# Patient Record
Sex: Male | Born: 1940 | Race: White | Hispanic: No | Marital: Married | Smoking: Former smoker
Health system: Southern US, Community
[De-identification: ages and names within clinical notes are randomized; demographics above are authoritative.]

## PROBLEM LIST (undated history)

## (undated) DIAGNOSIS — I2510 Atherosclerotic heart disease of native coronary artery without angina pectoris: Secondary | ICD-10-CM

## (undated) DIAGNOSIS — I447 Left bundle-branch block, unspecified: Secondary | ICD-10-CM

## (undated) DIAGNOSIS — I429 Cardiomyopathy, unspecified: Secondary | ICD-10-CM

## (undated) DIAGNOSIS — I493 Ventricular premature depolarization: Secondary | ICD-10-CM

## (undated) DIAGNOSIS — K219 Gastro-esophageal reflux disease without esophagitis: Secondary | ICD-10-CM

## (undated) DIAGNOSIS — I5022 Chronic systolic (congestive) heart failure: Secondary | ICD-10-CM

## (undated) DIAGNOSIS — I480 Paroxysmal atrial fibrillation: Secondary | ICD-10-CM

## (undated) DIAGNOSIS — E785 Hyperlipidemia, unspecified: Secondary | ICD-10-CM

## (undated) DIAGNOSIS — I428 Other cardiomyopathies: Secondary | ICD-10-CM

## (undated) DIAGNOSIS — Z95810 Presence of automatic (implantable) cardiac defibrillator: Secondary | ICD-10-CM

## (undated) DIAGNOSIS — M5134 Other intervertebral disc degeneration, thoracic region: Secondary | ICD-10-CM

## (undated) DIAGNOSIS — I509 Heart failure, unspecified: Secondary | ICD-10-CM

## (undated) DIAGNOSIS — I77810 Thoracic aortic ectasia: Secondary | ICD-10-CM

## (undated) DIAGNOSIS — K635 Polyp of colon: Secondary | ICD-10-CM

## (undated) DIAGNOSIS — C4A9 Merkel cell carcinoma, unspecified: Secondary | ICD-10-CM

## (undated) DIAGNOSIS — E669 Obesity, unspecified: Secondary | ICD-10-CM

## (undated) DIAGNOSIS — I10 Essential (primary) hypertension: Secondary | ICD-10-CM

## (undated) DIAGNOSIS — I4901 Ventricular fibrillation: Secondary | ICD-10-CM

## (undated) HISTORY — PX: SKIN CANCER EXCISION: SHX779

## (undated) HISTORY — DX: Ventricular fibrillation: I49.01

## (undated) HISTORY — DX: Other cardiomyopathies: I42.8

## (undated) HISTORY — DX: Other intervertebral disc degeneration, thoracic region: M51.34

## (undated) HISTORY — DX: Presence of automatic (implantable) cardiac defibrillator: Z95.810

## (undated) HISTORY — DX: Paroxysmal atrial fibrillation: I48.0

## (undated) HISTORY — DX: Essential (primary) hypertension: I10

## (undated) HISTORY — DX: Gastro-esophageal reflux disease without esophagitis: K21.9

## (undated) HISTORY — DX: Atherosclerotic heart disease of native coronary artery without angina pectoris: I25.10

## (undated) HISTORY — DX: Left bundle-branch block, unspecified: I44.7

## (undated) HISTORY — DX: Chronic systolic (congestive) heart failure: I50.22

## (undated) HISTORY — DX: Polyp of colon: K63.5

## (undated) HISTORY — PX: TONSILLECTOMY: SUR1361

## (undated) HISTORY — DX: Thoracic aortic ectasia: I77.810

## (undated) HISTORY — DX: Cardiomyopathy, unspecified: I42.9

## (undated) HISTORY — DX: Merkel cell carcinoma, unspecified: C4A.9

## (undated) HISTORY — DX: Obesity, unspecified: E66.9

## (undated) HISTORY — DX: Hyperlipidemia, unspecified: E78.5

## (undated) HISTORY — DX: Ventricular premature depolarization: I49.3

---
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
## 2005-06-15 ENCOUNTER — Ambulatory Visit (HOSPITAL_COMMUNITY): Admission: RE | Admit: 2005-06-15 | Discharge: 2005-06-15 | Payer: Self-pay | Admitting: General Surgery

## 2005-06-28 ENCOUNTER — Ambulatory Visit: Payer: Self-pay | Admitting: Oncology

## 2005-06-28 ENCOUNTER — Ambulatory Visit (HOSPITAL_COMMUNITY): Admission: RE | Admit: 2005-06-28 | Discharge: 2005-06-28 | Payer: Self-pay | Admitting: General Surgery

## 2005-06-29 ENCOUNTER — Ambulatory Visit (HOSPITAL_COMMUNITY): Admission: RE | Admit: 2005-06-29 | Discharge: 2005-06-29 | Payer: Self-pay | Admitting: General Surgery

## 2005-07-05 ENCOUNTER — Ambulatory Visit (HOSPITAL_COMMUNITY): Admission: RE | Admit: 2005-07-05 | Discharge: 2005-07-05 | Payer: Self-pay | Admitting: Oncology

## 2005-07-11 ENCOUNTER — Ambulatory Visit (HOSPITAL_COMMUNITY): Admission: RE | Admit: 2005-07-11 | Discharge: 2005-07-11 | Payer: Self-pay | Admitting: General Surgery

## 2005-08-04 ENCOUNTER — Ambulatory Visit: Admission: RE | Admit: 2005-08-04 | Discharge: 2005-08-29 | Payer: Self-pay | Admitting: *Deleted

## 2007-10-25 HISTORY — PX: COLONOSCOPY: SHX174

## 2013-08-09 ENCOUNTER — Other Ambulatory Visit: Payer: Self-pay | Admitting: Gastroenterology

## 2014-02-04 ENCOUNTER — Other Ambulatory Visit (HOSPITAL_BASED_OUTPATIENT_CLINIC_OR_DEPARTMENT_OTHER): Payer: Self-pay | Admitting: Family Medicine

## 2014-02-04 DIAGNOSIS — J329 Chronic sinusitis, unspecified: Secondary | ICD-10-CM

## 2014-02-07 ENCOUNTER — Ambulatory Visit (HOSPITAL_BASED_OUTPATIENT_CLINIC_OR_DEPARTMENT_OTHER)
Admission: RE | Admit: 2014-02-07 | Discharge: 2014-02-07 | Disposition: A | Discharge status: Home / Self Care | Payer: Medicare Other | Source: Ambulatory Visit | Attending: Family Medicine | Admitting: Family Medicine

## 2014-02-07 DIAGNOSIS — J329 Chronic sinusitis, unspecified: Secondary | ICD-10-CM

## 2014-02-07 DIAGNOSIS — R51 Headache: Secondary | ICD-10-CM | POA: Insufficient documentation

## 2014-10-30 ENCOUNTER — Encounter: Payer: Self-pay | Admitting: Cardiology

## 2014-11-04 ENCOUNTER — Encounter: Payer: Self-pay | Admitting: *Deleted

## 2014-11-05 ENCOUNTER — Ambulatory Visit (INDEPENDENT_AMBULATORY_CARE_PROVIDER_SITE_OTHER): Payer: Medicare Other | Admitting: Cardiology

## 2014-11-05 ENCOUNTER — Encounter: Payer: Self-pay | Admitting: Cardiology

## 2014-11-05 VITALS — BP 118/74 | HR 79 | Ht 71.0 in | Wt 272.8 lb

## 2014-11-05 DIAGNOSIS — E785 Hyperlipidemia, unspecified: Secondary | ICD-10-CM

## 2014-11-05 DIAGNOSIS — R0600 Dyspnea, unspecified: Secondary | ICD-10-CM

## 2014-11-05 DIAGNOSIS — R0609 Other forms of dyspnea: Secondary | ICD-10-CM

## 2014-11-05 DIAGNOSIS — R0602 Shortness of breath: Secondary | ICD-10-CM

## 2014-11-05 DIAGNOSIS — I10 Essential (primary) hypertension: Secondary | ICD-10-CM

## 2014-11-05 NOTE — Progress Notes (Signed)
Patient ID: AIZIK REH, male   DOB: May 07, 1941, 74 y.o.   MRN: [id number]     Patient Name: [name] Date of Encounter: 11/05/2014  Primary Care Provider:  Orpah Melter, MD Primary Cardiologist:  Dorothy Spark   Problem List   Past Medical History  Diagnosis Date  . Obesity   . Hyperlipidemia   . LBBB (left bundle branch block)   . Prediabetes   . Hyperplastic colon polyp   . Degenerative disc disease, thoracic   . Acid reflux    Past Surgical History  Procedure Laterality Date  . Excision of merkel cell  2006  . Left knee surgery  1960  . Colonoscopy  2009  . Left and right cataract sugery      Allergies  Allergies  Allergen Reactions  . 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 The patient still works as an Chief Financial Officer and walks daily and has noticed in the last 2-3 months that is becoming more and more difficult. He gets short of breath with moderate exertion and was ignoring it for a while as he attributed to deconditioning and obesity. He denies any chest pain, he has just been prescribed low-dose Lasix for lower extremity edema predominantly in his right leg as his lymph nodes were removed during treatment of his cancer. He denies any palpitations or syncope. His father had myocardial infarction at age 73. His mom died of complications of lupus. Both of his kids that are a their late 50s have hypertension. His wife says that he snores and has sleep apnea but has never been evaluated for it.  Home Medications  Prior to Admission medications   Medication Sig Start Date End Date Taking? Authorizing Provider  aspirin 81 MG tablet Take 81 mg by mouth daily.   Yes Historical Provider, MD  fluorouracil (EFUDEX) 5 % cream Apply 5 application  topically as needed. Crusty patches on face 10/15/14  Yes Historical Provider, MD  furosemide (LASIX) 20 MG tablet Take 20 mg by mouth daily. 11/01/14  Yes Historical Provider, MD  ibuprofen (ADVIL,MOTRIN) 200 MG tablet Take 200 mg by mouth as needed for moderate pain.   Yes Historical Provider, MD  meloxicam (MOBIC) 15 MG tablet Take 15 mg by mouth as needed for pain. For pain   Yes Historical Provider, MD  metoprolol tartrate (LOPRESSOR) 25 MG tablet Take 25 mg by mouth 2 (two) times daily.   Yes Historical Provider, MD  omeprazole (PRILOSEC) 20 MG capsule Take 20 mg by mouth daily.   Yes Historical Provider, MD  simvastatin (ZOCOR) 20 MG tablet Take 20 mg by mouth daily.   Yes Historical Provider, MD    Family History  Family History  Problem Relation Age of Onset  . Cancer Father     prostate  . Cancer Mother     breast  . Lupus Mother   . Stroke Sister   . Thyroid disease Sister     Social History  History   Social History  . Marital Status: Married    Spouse Name: bridgette    Number of Children: 2  . Years of Education: college   Occupational History  . [employer]    Social History Main Topics  . Smoking status: Never Smoker   .  Smokeless tobacco: Not on file  . Alcohol Use: 0.0 oz/week    0 Not specified per week     Comment: wine occasionally  . Drug Use: No  . Sexual Activity: Not on file   Other Topics Concern  . Not on file   Social History Narrative     Review of Systems, as per HPI, otherwise negative General:  No chills, fever, night sweats or weight changes.  Cardiovascular:  No chest pain, dyspnea on exertion, edema, orthopnea, palpitations, paroxysmal nocturnal dyspnea. Dermatological: No rash, lesions/masses Respiratory: No cough, dyspnea Urologic: No hematuria, dysuria Abdominal:   No nausea, vomiting, diarrhea, bright red blood per rectum, melena, or hematemesis Neurologic:  No visual changes, wkns, changes in mental  status. All other systems reviewed and are otherwise negative except as noted above.  Physical Exam  Blood pressure 118/74, pulse 79, height 5\' 11"  (1.803 m), weight 272 lb 12.8 oz (123.741 kg).  General: Pleasant, NAD Psych: Normal affect. Neuro: Alert and oriented X 3. Moves all extremities spontaneously. HEENT: Normal  Neck: Supple without bruits or JVD. Lungs:  Resp regular and unlabored, CTA. Heart: RRR no s3, s4, or murmurs. Abdomen: Soft, non-tender, non-distended, BS + x 4.  Extremities: No clubbing, cyanosis or edema. DP/PT/Radials 2+ and equal bilaterally.  Labs:  No results for input(s): CKTOTAL, CKMB, TROPONINI in the last 72 hours. No results found for: WBC, HGB, HCT, MCV, PLT  No results found for: DDIMER Invalid input(s): POCBNP No results found for: NA, K, CL, CO2, GLUCOSE, BUN, CREATININE, CALCIUM, PROT, ALBUMIN, AST, ALT, ALKPHOS, BILITOT, GFRNONAA, GFRAA No results found for: CHOL, HDL, LDLCALC, TRIG  Accessory Clinical Findings  Echocardiogram - none  ECG -sinus rhythm, first-degree AV block, left bundle branch block and very frequent PVCs.    Assessment & Plan  74 year old gentleman  1. Dyspnea on exertion that is new and progressively worsening, also new left bundle branch block S3 don't have any EKG to compare. We will schedule patient for Lexi scan nuclear stress test to evaluate for ischemia. The other possibilities sleep apnea if all the workup negative we will refer for sleep apnea evaluation.  2. Shortness of breath and lower extremity edema - we will order an echocardiogram to evaluate for systolic and diastolic function.  3. Hypertension - controlled on current regimen  4. Hyperlipidemia unknown numbers, followed by primary care physician currently on simvastatin with no side effects.   Follow up in one month's.    Dorothy Spark, MD, [facility] 11/05/2014, 8:48 AM

## 2014-11-05 NOTE — Patient Instructions (Signed)
Your physician recommends that you continue on your current medications as directed. Please refer to the Current Medication list given to you today.    Your physician has requested that you have an echocardiogram. Echocardiography is a painless test that uses sound waves to create images of your heart. It provides your doctor with information about the size and shape of your heart and how well your heart's chambers and valves are working. This procedure takes approximately one hour. There are no restrictions for this procedure.    Your physician has requested that you have a lexiscan myoview. For further information please visit [email or web address]. Please follow instruction sheet, as given.    Your physician recommends that you schedule a follow-up appointment in: [place]

## 2014-11-11 ENCOUNTER — Telehealth: Payer: Self-pay | Admitting: *Deleted

## 2014-11-11 ENCOUNTER — Ambulatory Visit (HOSPITAL_BASED_OUTPATIENT_CLINIC_OR_DEPARTMENT_OTHER): Payer: Medicare Other

## 2014-11-11 ENCOUNTER — Ambulatory Visit (HOSPITAL_COMMUNITY): Payer: Medicare Other | Attending: Cardiology | Admitting: Radiology

## 2014-11-11 ENCOUNTER — Other Ambulatory Visit (HOSPITAL_COMMUNITY): Payer: Medicare Other | Admitting: *Deleted

## 2014-11-11 DIAGNOSIS — I447 Left bundle-branch block, unspecified: Secondary | ICD-10-CM | POA: Insufficient documentation

## 2014-11-11 DIAGNOSIS — R0602 Shortness of breath: Secondary | ICD-10-CM

## 2014-11-11 DIAGNOSIS — R931 Abnormal findings on diagnostic imaging of heart and coronary circulation: Secondary | ICD-10-CM

## 2014-11-11 DIAGNOSIS — R0600 Dyspnea, unspecified: Secondary | ICD-10-CM

## 2014-11-11 DIAGNOSIS — I10 Essential (primary) hypertension: Secondary | ICD-10-CM | POA: Insufficient documentation

## 2014-11-11 DIAGNOSIS — E785 Hyperlipidemia, unspecified: Secondary | ICD-10-CM | POA: Insufficient documentation

## 2014-11-11 MED ORDER — TECHNETIUM TC 99M SESTAMIBI GENERIC - CARDIOLITE
30.0000 | Freq: Once | INTRAVENOUS | Status: AC | PRN
  Administered 2014-11-11: 30 via INTRAVENOUS

## 2014-11-11 MED ORDER — ADENOSINE (DIAGNOSTIC) 3 MG/ML IV SOLN: 0.5600 mg/kg | Freq: Once | INTRAVENOUS | Status: AC

## 2014-11-11 MED ORDER — PERFLUTREN LIPID MICROSPHERE
1.0000 mL | INTRAVENOUS | Status: AC | PRN
  Administered 2014-11-11: 1 mL via INTRAVENOUS

## 2014-11-11 MED ORDER — TECHNETIUM TC 99M SESTAMIBI GENERIC - CARDIOLITE
10.0000 | Freq: Once | INTRAVENOUS | Status: AC | PRN
  Administered 2014-11-11: 10 via INTRAVENOUS

## 2014-11-11 NOTE — Progress Notes (Signed)
2D Echo with Definity completed. 11/11/2014

## 2014-11-11 NOTE — Telephone Encounter (Signed)
Reviewed the pts echo and nuclear stress test with the DOD Dr Mare Ferrari from today, due to primary Cardiologist Dr Meda Coffee being out of the office.  Noted the pts echo showed a EF of 25-30% and no prior echo to compare with.  Nuclear stress test revealed low risk study.  Per Dr Mare Ferrari the pt should have a BMET drawn tomorrow to assess kidney function to possibly start new medication Lisinopril 5 mg po daily, and follow-up in a week with Dr Meda Coffee or a PA. Made pt an appt for 11/12/14 at our office to have a bmet checked.  Pt aware that someone from scheduling will be contacting him to have a follow-up appt scheduled for later in the week, next week. Pt verbalized understanding and agrees with this plan.  Will routed this message to Dr Meda Coffee for further review.

## 2014-11-11 NOTE — Progress Notes (Signed)
  [facility] [address] [place], [place] [address] [phone]    Cardiology Nuclear Med Study  [name] is a 74 y.o. male     MRN : [id number]     DOB: [birth year]  Procedure Date: 11/11/2014  Nuclear Med Background Indication for Stress Test:  Evaluation for Ischemia History:  n/a Cardiac Risk Factors: Hypertension, LBBB and Lipids  Symptoms:  DOE and SOB   Nuclear Pre-Procedure Caffeine/Decaff Intake:  7:00pm NPO After: 6:30am   Lungs:  clear O2 Sat: 94% on room air. IV 0.9% NS with Angio Cath:  22g  IV Site: R Wrist  IV Started by:  Matilde Haymaker, RN  Chest Size (in):  50 Cup Size: n/a  Height: 5\' 11"  (1.803 m)  Weight:  269 lb (122.018 kg)  BMI:  Body mass index is 37.53 kg/(m^2). Tech Comments:  No Lopressor x 17 hrs    Nuclear Med Study 1 or 2 day study: 1 day  Stress Test Type:  Adenosine  Reading MD: n/a  Order Authorizing Provider:  Filiberto Pinks  Resting Radionuclide: Technetium 27m Sestamibi  Resting Radionuclide Dose: 11.0 mCi   Stress Radionuclide:  Technetium 35m Sestamibi  Stress Radionuclide Dose: 33.0 mCi           Stress Protocol Rest HR: 100 Stress HR: 104  Rest BP: 110/89 Stress BP: 134/84  Exercise Time (min): n/a METS: n/a   Predicted Max HR: 147 bpm % Max HR: 70.75 bpm Rate Pressure Product: 13936   Dose of Adenosine (mg):  20 Dose of Lexiscan: n/a mg  Dose of Atropine (mg): n/a Dose of Dobutamine: n/a mcg/kg/min (at max HR)  Stress Test Technologist: Perrin Maltese, EMT-P  Nuclear Technologist:  Earl Many, CNMT     Rest Procedure:  Myocardial perfusion imaging was performed at rest 45 minutes following the intravenous administration of Technetium 73m Sestamibi. Rest ECG: NSR-LBBB  Stress Procedure:  The patient received IV Lexiscan 0.4 mg over 15-seconds.  Technetium 43m Sestamibi injected at 30-seconds. This patient had a hot flash and felt warm with the Adenosine infusion.  Quantitative spect images were obtained after a 45 minute delay. Stress ECG: Uninteretable due to baseline LBBB  QPS Raw Data Images:  Acquisition technically good; severe LVE. Stress Images:  Normal homogeneous uptake in all areas of the myocardium. Rest Images:  Normal homogeneous uptake in all areas of the myocardium. Subtraction (SDS):  No evidence of ischemia. Transient Ischemic Dilatation (Normal <1.22):  1.04 Lung/Heart Ratio (Normal <0.45):  0.34  Quantitative Gated Spect Images QGS EDV:  NA ml QGS ESV:  NA ml  Impression Exercise Capacity:  Adenosine study with no exercise. BP Response:  Normal blood pressure response. Clinical Symptoms:  No chest pain or dyspnea. ECG Impression:  Baseline:  LBBB.  EKG uninterpretable due to LBBB at rest and stress. Frequent PVCs, couplets. Comparison with Prior Nuclear Study: No previous nuclear study performed  Overall Impression:  Low risk stress nuclear study with normal perfusion; significant LVE.  LV Ejection Fraction: Study not gated.  LV Wall Motion:  Study not gated; suggest echo to assess LV function.   Kirk Ruths

## 2014-11-12 ENCOUNTER — Other Ambulatory Visit (INDEPENDENT_AMBULATORY_CARE_PROVIDER_SITE_OTHER): Payer: Medicare Other | Admitting: *Deleted

## 2014-11-12 ENCOUNTER — Telehealth: Payer: Self-pay | Admitting: Cardiology

## 2014-11-12 DIAGNOSIS — R931 Abnormal findings on diagnostic imaging of heart and coronary circulation: Secondary | ICD-10-CM

## 2014-11-12 DIAGNOSIS — R0602 Shortness of breath: Secondary | ICD-10-CM

## 2014-11-12 LAB — BASIC METABOLIC PANEL
BUN: 18 mg/dL (ref 6–23)
CO2: 30 mEq/L (ref 19–32)
Calcium: 9.5 mg/dL (ref 8.4–10.5)
Chloride: 107 mEq/L (ref 96–112)
Creatinine, Ser: 1.09 mg/dL (ref 0.40–1.50)
GFR: 70.4 mL/min (ref >60.00)
Glucose, Bld: 117 mg/dL — ABNORMAL HIGH (ref 70–99)
Potassium: 4 mEq/L (ref 3.5–5.1)
Sodium: 141 mEq/L (ref 135–145)

## 2014-11-12 MED ORDER — ADENOSINE (DIAGNOSTIC) 3 MG/ML IV SOLN
0.5600 mg/kg | Freq: Once | INTRAVENOUS | Status: AC
  Administered 2014-11-11: 60 mg via INTRAVENOUS

## 2014-11-12 MED ORDER — LISINOPRIL 5 MG PO TABS: 5.0000 mg | ORAL_TABLET | Freq: Every day | ORAL | Status: DC

## 2014-11-12 NOTE — Telephone Encounter (Signed)
Pt contacted about phone conversation yesterday 1/19, in regards to abnormal echo and new order per DOD Dr Mare Ferrari for the pt to have a bmet on 1/20 to assess kidney function to start new med Lisinopril 5 mg po daily for an EF on his echo of 25%. Informed the pt that these orders and results were discussed and resulted by Dr Meda Coffee as well, and per Dr Meda Coffee she agreed with Dr Sherryl Barters decision.  Informed the pt that per Dr Meda Coffee he will need to be seen  in the clinic within the next 2 weeks, with her, and we will order additional labs- CMP, BNP, CBC, TSH, and lipids then as well. Informed the pt that his lab results from today have not resulted yet, but when they do I will call him with the results and further order to proceed with the Lisinopril if his kidney function is WNL.  Also informed the pt that I did receive a copy of recent labs and dictation note from his PCP Dr Doyle Askew, and had them scanned into our system.  Informed the pt that my scheduler will be calling him to set up a 2 week OV with Dr Meda Coffee, with same day labs.  Pt verbalized understanding and agrees with this plan.

## 2014-11-12 NOTE — Addendum Note (Signed)
Addended by: Gardiner Coins on: 11/12/2014 08:58 AM   Modules accepted: Orders

## 2014-11-12 NOTE — Telephone Encounter (Signed)
Follow up      Checking to see if you received the info given to [facility] (lab tech) yesterday

## 2014-11-12 NOTE — Telephone Encounter (Signed)
Contacted the pt to notify him that his BMET results came back and revealed normal kidney function.  Informed the pt that per Dr Mare Ferrari, if his kidney function is normal, he should start taking Lisinopril 5 mg po daily, and come to his scheduled appt with Dr Meda Coffee on 11/25/14 at 0830, with labs same day. Confirmed the pharmacy of choice with the pt.  Pt verbalized understanding and agree with this plan.  Will route this message to Dr Meda Coffee for further review.

## 2014-11-12 NOTE — Telephone Encounter (Signed)
New Message ° °Pt returned call//sr  °

## 2014-11-24 HISTORY — PX: CATARACT EXTRACTION W/ INTRAOCULAR LENS  IMPLANT, BILATERAL: SHX1307

## 2014-11-25 ENCOUNTER — Encounter: Payer: Self-pay | Admitting: Cardiology

## 2014-11-25 ENCOUNTER — Other Ambulatory Visit (INDEPENDENT_AMBULATORY_CARE_PROVIDER_SITE_OTHER): Payer: Medicare Other | Admitting: *Deleted

## 2014-11-25 ENCOUNTER — Ambulatory Visit (INDEPENDENT_AMBULATORY_CARE_PROVIDER_SITE_OTHER): Payer: Medicare Other | Admitting: Cardiology

## 2014-11-25 VITALS — BP 120/60 | HR 51 | Ht 71.0 in | Wt 269.0 lb

## 2014-11-25 DIAGNOSIS — R0602 Shortness of breath: Secondary | ICD-10-CM

## 2014-11-25 DIAGNOSIS — I428 Other cardiomyopathies: Secondary | ICD-10-CM

## 2014-11-25 DIAGNOSIS — R931 Abnormal findings on diagnostic imaging of heart and coronary circulation: Secondary | ICD-10-CM

## 2014-11-25 DIAGNOSIS — R0609 Other forms of dyspnea: Secondary | ICD-10-CM

## 2014-11-25 DIAGNOSIS — I10 Essential (primary) hypertension: Secondary | ICD-10-CM

## 2014-11-25 DIAGNOSIS — I5022 Chronic systolic (congestive) heart failure: Secondary | ICD-10-CM

## 2014-11-25 DIAGNOSIS — I429 Cardiomyopathy, unspecified: Secondary | ICD-10-CM

## 2014-11-25 DIAGNOSIS — I420 Dilated cardiomyopathy: Secondary | ICD-10-CM

## 2014-11-25 DIAGNOSIS — R0600 Dyspnea, unspecified: Secondary | ICD-10-CM

## 2014-11-25 LAB — CBC WITH DIFFERENTIAL/PLATELET
Basophils Absolute: 0 10*3/uL (ref 0.0–0.1)
Basophils Relative: 0.1 % (ref 0.0–3.0)
Eosinophils Absolute: 0.1 10*3/uL (ref 0.0–0.7)
Eosinophils Relative: 2.4 % (ref 0.0–5.0)
HCT: 39.8 % (ref 39.0–52.0)
Hemoglobin: 13.9 g/dL (ref 13.0–17.0)
Lymphocytes Relative: 23.1 % (ref 12.0–46.0)
Lymphs Abs: 0.9 10*3/uL (ref 0.7–4.0)
MCHC: 34.8 g/dL (ref 30.0–36.0)
MCV: 96.6 fl (ref 78.0–100.0)
Monocytes Absolute: 0.5 10*3/uL (ref 0.1–1.0)
Monocytes Relative: 11.3 % (ref 3.0–12.0)
Neutro Abs: 2.6 10*3/uL (ref 1.4–7.7)
Neutrophils Relative %: 63.1 % (ref 43.0–77.0)
Platelets: 136 10*3/uL — ABNORMAL LOW (ref 150.0–400.0)
RBC: 4.11 Mil/uL — ABNORMAL LOW (ref 4.22–5.81)
RDW: 13.3 % (ref 11.5–15.5)
WBC: 4.1 10*3/uL (ref 4.0–10.5)

## 2014-11-25 LAB — LIPID PANEL
Cholesterol: 114 mg/dL (ref 0–200)
HDL: 42.2 mg/dL (ref >39.00)
LDL Cholesterol: 59 mg/dL (ref 0–99)
NonHDL: 71.8
Total CHOL/HDL Ratio: 3
Triglycerides: 66 mg/dL (ref 0.0–149.0)
VLDL: 13.2 mg/dL (ref 0.0–40.0)

## 2014-11-25 LAB — COMPREHENSIVE METABOLIC PANEL
ALT: 22 U/L (ref 0–53)
AST: 17 U/L (ref 0–37)
Albumin: 4.2 g/dL (ref 3.5–5.2)
Alkaline Phosphatase: 50 U/L (ref 39–117)
BUN: 18 mg/dL (ref 6–23)
CO2: 29 mEq/L (ref 19–32)
Calcium: 9.3 mg/dL (ref 8.4–10.5)
Chloride: 106 mEq/L (ref 96–112)
Creatinine, Ser: 1.07 mg/dL (ref 0.40–1.50)
GFR: 71.91 mL/min (ref >60.00)
Glucose, Bld: 106 mg/dL — ABNORMAL HIGH (ref 70–99)
Potassium: 4 mEq/L (ref 3.5–5.1)
Sodium: 140 mEq/L (ref 135–145)
Total Bilirubin: 0.6 mg/dL (ref 0.2–1.2)
Total Protein: 6.6 g/dL (ref 6.0–8.3)

## 2014-11-25 LAB — TSH: TSH: 5.19 u[IU]/mL — ABNORMAL HIGH (ref 0.35–4.50)

## 2014-11-25 LAB — BRAIN NATRIURETIC PEPTIDE: Pro B Natriuretic peptide (BNP): 403 pg/mL — ABNORMAL HIGH (ref 0.0–100.0)

## 2014-11-25 NOTE — Addendum Note (Signed)
Addended by: Eulis Foster on: 11/25/2014 07:47 AM   Modules accepted: Orders

## 2014-11-25 NOTE — Patient Instructions (Signed)
Your physician recommends that you continue on your current medications as directed. Please refer to the Current Medication list given to you today.   Your physician has requested that you have an echocardiogram. Echocardiography is a painless test that uses sound waves to create images of your heart. It provides your doctor with information about the size and shape of your heart and how well your heart's chambers and valves are working. This procedure takes approximately one hour. There are no restrictions for this procedure. DR Meda Coffee WOULD LIKE FOR YOU TO HAVE THIS DONE PRIOR TO YOUR 3 MONTH FOLLOW-UP APPOINTMENT WITH HER.   You have been referred to [facility]   Your physician recommends that you schedule a follow-up appointment in: [place]

## 2014-11-25 NOTE — Progress Notes (Signed)
Patient ID: [name], male   DOB: 10/14/1941, 74 y.o.   MRN: [id number]     Patient Name: [name] Date of Encounter: 11/25/2014  Primary Care Provider:  Orpah Melter, MD Primary Cardiologist:  Dorothy Spark   Problem List   Past Medical History  Diagnosis Date  . Obesity   . Hyperlipidemia   . LBBB (left bundle branch block)   . Prediabetes   . Hyperplastic colon polyp   . Degenerative disc disease, thoracic   . Acid reflux    Past Surgical History  Procedure Laterality Date  . Excision of merkel cell  2006  . Left knee surgery  1960  . Colonoscopy  2009  . Left and right cataract sugery      Allergies  Allergies  Allergen Reactions  . 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 The patient still works as an Chief Financial Officer and walks daily and has noticed in the last 2-3 months that is becoming more and more difficult. He gets short of breath with moderate exertion and was ignoring it for a while as he attributed to deconditioning and obesity. He denies any chest pain, he has just been prescribed low-dose Lasix for lower extremity edema predominantly in his right leg as his lymph nodes were removed during treatment of his cancer. He denies any palpitations or syncope. His father had myocardial infarction at age 79. His mom died of complications of lupus. Both of his kids that are a their late 75s have hypertension. His wife says that he snores and has sleep apnea but has never been evaluated for it.  11/25/2014 - the patient is coming after 3 weeks, he underwentLexiscan nuclear stress test that showed no prior scar and no ischemia but dilated left ventricle with moderately to severely impaired systolic function. Patient was started on lisinopril 5  mg daily that doesn't give him any side effects but doesn't seem any on improvement yet. He has stable right lower extremity lymphedema his left lower extremity doesn't have any edema.no orthopnea or paroxysmal nocturnal dyspnea or dyspnea on exertion for example 2 flight of stairs will make him short of breath. No chest pain. No palpitations.  Home Medications  Prior to Admission medications   Medication Sig Start Date End Date Taking? Authorizing Provider  aspirin 81 MG tablet Take 81 mg by mouth daily.   Yes Historical Provider, MD  fluorouracil (EFUDEX) 5 % cream Apply 5 application topically as needed. Crusty patches on face 10/15/14  Yes Historical Provider, MD  furosemide (LASIX) 20 MG tablet Take 20 mg by mouth daily. 11/01/14  Yes Historical Provider, MD  ibuprofen (ADVIL,MOTRIN) 200 MG tablet Take 200 mg by mouth as needed for moderate pain.   Yes Historical Provider, MD  meloxicam (MOBIC) 15 MG tablet Take 15 mg by mouth as needed for pain. For pain   Yes Historical Provider, MD  metoprolol tartrate (LOPRESSOR) 25 MG tablet Take 25 mg by mouth 2 (two) times daily.   Yes Historical Provider, MD  omeprazole (PRILOSEC) 20 MG capsule Take 20 mg by mouth daily.   Yes Historical Provider, MD  simvastatin (ZOCOR) 20 MG tablet Take 20 mg by mouth daily.   Yes Historical Provider, MD    Family History  Family History  Problem  Relation Age of Onset  . Cancer Father     prostate  . Cancer Mother     breast  . Lupus Mother   . Stroke Sister   . Thyroid disease Sister     Social History  History   Social History  . Marital Status: Married    Spouse Name: bridgette    Number of Children: 2  . Years of Education: college   Occupational History  . [employer]    Social History Main Topics  . Smoking status: Never Smoker   . Smokeless tobacco: Not on file  . Alcohol Use: 0.0 oz/week    0 Not specified per week     Comment: wine occasionally  . Drug Use: No  .  Sexual Activity: Not on file   Other Topics Concern  . Not on file   Social History Narrative     Review of Systems, as per HPI, otherwise negative General:  No chills, fever, night sweats or weight changes.  Cardiovascular:  No chest pain, dyspnea on exertion, edema, orthopnea, palpitations, paroxysmal nocturnal dyspnea. Dermatological: No rash, lesions/masses Respiratory: No cough, dyspnea Urologic: No hematuria, dysuria Abdominal:   No nausea, vomiting, diarrhea, bright red blood per rectum, melena, or hematemesis Neurologic:  No visual changes, wkns, changes in mental status. All other systems reviewed and are otherwise negative except as noted above.  Physical Exam  Blood pressure 120/60, pulse 51, height 5\' 11"  (1.803 m), weight 269 lb (122.018 kg).  General: Pleasant, NAD Psych: Normal affect. Neuro: Alert and oriented X 3. Moves all extremities spontaneously. HEENT: Normal  Neck: Supple without bruits or JVD. Lungs:  Resp regular and unlabored, CTA. Heart: RRR no s3, s4, or murmurs. Abdomen: Soft, non-tender, non-distended, BS + x 4.  Extremities: No clubbing, cyanosis, RLE lymphedema. DP/PT/Radials 2+ and equal bilaterally.  Labs:  No results for input(s): CKTOTAL, CKMB, TROPONINI in the last 72 hours. No results found for: WBC, HGB, HCT, MCV, PLT  No results found for: DDIMER Invalid input(s): POCBNP    Component Value Date/Time   NA 141 11/12/2014 0742   K 4.0 11/12/2014 0742   CL 107 11/12/2014 0742   CO2 30 11/12/2014 0742   GLUCOSE 117* 11/12/2014 0742   BUN 18 11/12/2014 0742   CREATININE 1.09 11/12/2014 0742   CALCIUM 9.5 11/12/2014 0742   No results found for: CHOL, HDL, LDLCALC, TRIG  Accessory Clinical Findings  Echocardiogram - 11/11/2014 Left ventricle: The cavity size was normal. There was mild focal basal hypertrophy of the septum. Systolic function was severely reduced. The estimated ejection fraction was in the range of 25% to 30%.  Diffuse hypokinesis. There is akinesis of the inferior myocardium. Acoustic contrast opacification revealed no evidence ofthrombus. - Mitral valve: There was moderate regurgitation directed posteriorly. - Left atrium: The atrium was moderately dilated. ECG -sinus rhythm, first-degree AV block, left bundle branch block and very frequent PVCs.  Lexiscan 11/11/2014 Impression Exercise Capacity: Adenosine study with no exercise. BP Response: Normal blood pressure response. Clinical Symptoms: No chest pain or dyspnea. ECG Impression: Baseline: LBBB. EKG uninterpretable due to LBBB at rest and stress. Frequent PVCs, couplets. Comparison with Prior Nuclear Study: No previous nuclear study performed  Overall Impression: Low risk stress nuclear study with normal perfusion; significant LVE.  LV Ejection Fraction: Study not gated. LV Wall Motion: Study not gated; suggest echo to assess LV function.  Kirk Ruths     Assessment & Plan  74 year old gentleman  1.  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  The patient was started on lisinopril 5 mg daily if his creatinine is normal we will increase to 10 mg daily. We cannot start him on beta blocker as his baseline heart rate is 51. We will add spironolactone if K ok.  We will refer him to cardiac rehabilitation. We will follow him in 3 months with echocardiogram if his left ventricle ejection fraction remains unchanged will refer him for ICD implantation.  2. Hypertension - controlled on current regimen  3. Hyperlipidemia unknown numbers, followed by primary care physician currently on simvastatin with no side effects.  Follow up in 3 months.   Dorothy Spark, MD, [facility] 11/25/2014, 8:50 AM

## 2014-11-27 ENCOUNTER — Telehealth: Payer: Self-pay | Admitting: Cardiology

## 2014-11-27 NOTE — Telephone Encounter (Signed)
New Msg       Ida from [facility] calling, states lab results that were sent are still unable to be read.   Can they be mailed? Or are there any other suggesstions?  Please return call and advise [phone].

## 2014-11-27 NOTE — Telephone Encounter (Signed)
Informed Ida at [facility],  that I routed the pts lab results to Dr Doyle Askew through epic.  Informed her that all he would then need to do is check his in-basket for the lab results.  Ida verbalized understanding and gracious for all the assistance provided.

## 2014-12-10 ENCOUNTER — Telehealth: Payer: Self-pay | Admitting: Cardiology

## 2014-12-10 NOTE — Telephone Encounter (Signed)
Informed the pt and wife that I spoke with Cardiac Rehab today and made them aware that the referral for this will be refaxed today.  Informed both parties that a confirmation of fax was sent through stating that referral was sent and received by cardiac rehab.  Informed both parties that once the referral goes through, it does have to get approved through the pts insurance and then he will be notified for new pt orientation for cardiac rehab.  Informed both parties that everything is in process and being worked on.  Both verbalized understanding and agree with this plan.

## 2014-12-10 NOTE — Telephone Encounter (Signed)
New Message         Pt's wife calling stating that they have been trying to get pt scheduled for an appt w/ cardiac rehab and are getting the run around. Wanting to know if everything has been sent/done on Dr. Francesca Oman part in order for pt to have cardiac rehab. Please call back and advise.

## 2014-12-16 ENCOUNTER — Encounter (HOSPITAL_COMMUNITY): Payer: Self-pay

## 2014-12-16 ENCOUNTER — Ambulatory Visit: Payer: Medicare Other | Admitting: Cardiology

## 2014-12-16 ENCOUNTER — Encounter (HOSPITAL_COMMUNITY)
Admission: RE | Admit: 2014-12-16 | Discharge: 2014-12-16 | Disposition: A | Discharge status: Home / Self Care | Payer: Medicare Other | Source: Ambulatory Visit | Attending: Cardiology | Admitting: Cardiology

## 2014-12-16 ENCOUNTER — Telehealth: Payer: Self-pay | Admitting: Cardiology

## 2014-12-16 DIAGNOSIS — I509 Heart failure, unspecified: Secondary | ICD-10-CM | POA: Insufficient documentation

## 2014-12-16 MED ORDER — LISINOPRIL 2.5 MG PO TABS: 2.5000 mg | ORAL_TABLET | Freq: Every day | ORAL | Status: DC

## 2014-12-16 NOTE — Progress Notes (Addendum)
Medications reviewed with patient and his wife as part of cardiac rehab orientation.  Pt recently started on levothyroxine however unsure of dosage.  Pt instructed to bring medication bottle or dosing information with him to next scheduled appointment. Pt BP 94/60 sitting, 94/60 standing.  HR-80.  Pt c/o fatigue, otherwise asymptomatic.  Pt has had light PO food or fluid intake today.  Pt given gatorade.  Recheck 98/60 sitting, 104/60 standing.  PC to Dr. Meda Coffee, Lm for Karlene Einstein , Dr Meda Coffee nurse to return call.

## 2014-12-16 NOTE — Telephone Encounter (Signed)
New message     Pt c/o BP issue: STAT if pt c/o blurred vision, one-sided weakness or slurred speech  1. What are your last 5 BP readings? 94/60 2. Are you having any other symptoms (ex. Dizziness, headache, blurred vision, passed out)? no 3. What is your BP issue? Pt is at cardiac rehab orientation.  He bp is low..  Will Dr Meda Coffee want to adjust his bp medication

## 2014-12-16 NOTE — Telephone Encounter (Signed)
He should decrease lisinopril to 2.5 mg po daily.

## 2014-12-16 NOTE — Telephone Encounter (Signed)
**Note De-Identified [name]** [name] at cardiac rehab is advised and she verbalized understanding. Lisinopril 2.5 mg daily updated in the pts chart.

## 2014-12-17 ENCOUNTER — Telehealth (HOSPITAL_COMMUNITY): Payer: Self-pay | Admitting: Cardiac Rehabilitation

## 2014-12-17 NOTE — Telephone Encounter (Signed)
(  late entry for 12/16/2014 5:20pm)  pc received from [place], Dr. Francesca Oman office with instruction for pt to decrease lisinopril to 2.5mg  daily.  Pc to pt to advise of this new order.  Pt and pt wife verbalized understanding.

## 2014-12-22 ENCOUNTER — Encounter (HOSPITAL_COMMUNITY)
Admission: RE | Admit: 2014-12-22 | Discharge: 2014-12-22 | Disposition: A | Discharge status: Home / Self Care | Payer: Medicare Other | Source: Ambulatory Visit | Attending: Cardiology | Admitting: Cardiology

## 2014-12-22 DIAGNOSIS — I509 Heart failure, unspecified: Secondary | ICD-10-CM | POA: Diagnosis not present

## 2014-12-22 NOTE — Progress Notes (Addendum)
Pt started cardiac rehab today.  Pt tolerated light exercise without difficulty. Telemetry rhythm Sinus with a first degree heart block, bundle branch block and occasional PVC's this has been previously documented.  Vital signs stable.[name]'s short and long term goals are to improve his fatigue level be able to exercise and to loose weight. Will continue to monitor the patient throughout  the program. PHQ=0.

## 2014-12-26 ENCOUNTER — Encounter (HOSPITAL_COMMUNITY)
Admission: RE | Admit: 2014-12-26 | Discharge: 2014-12-26 | Disposition: A | Discharge status: Home / Self Care | Payer: Medicare Other | Source: Ambulatory Visit | Attending: Cardiology | Admitting: Cardiology

## 2014-12-26 DIAGNOSIS — I509 Heart failure, unspecified: Secondary | ICD-10-CM | POA: Insufficient documentation

## 2014-12-29 ENCOUNTER — Encounter (HOSPITAL_COMMUNITY)
Admission: RE | Admit: 2014-12-29 | Discharge: 2014-12-29 | Disposition: A | Discharge status: Home / Self Care | Payer: Medicare Other | Source: Ambulatory Visit | Attending: Cardiology | Admitting: Cardiology

## 2014-12-29 DIAGNOSIS — I509 Heart failure, unspecified: Secondary | ICD-10-CM | POA: Diagnosis not present

## 2014-12-31 ENCOUNTER — Encounter (HOSPITAL_COMMUNITY)
Admission: RE | Admit: 2014-12-31 | Discharge: 2014-12-31 | Disposition: A | Discharge status: Home / Self Care | Payer: Medicare Other | Source: Ambulatory Visit | Attending: Cardiology | Admitting: Cardiology

## 2014-12-31 DIAGNOSIS — I509 Heart failure, unspecified: Secondary | ICD-10-CM | POA: Diagnosis not present

## 2015-01-02 ENCOUNTER — Encounter (HOSPITAL_COMMUNITY)
Admission: RE | Admit: 2015-01-02 | Discharge: 2015-01-02 | Disposition: A | Discharge status: Home / Self Care | Payer: Medicare Other | Source: Ambulatory Visit | Attending: Cardiology | Admitting: Cardiology

## 2015-01-02 DIAGNOSIS — I509 Heart failure, unspecified: Secondary | ICD-10-CM | POA: Diagnosis not present

## 2015-01-02 NOTE — Progress Notes (Addendum)
Reviewed home exercise with pt today.  Pt plans to walk at home and go to gym for exercise.  Reviewed THR, pulse, RPE, sign and symptoms, and when to call 911 or MD.  Pt voiced understanding. Alberteen Sam, MA, ACSM RCEP

## 2015-01-05 ENCOUNTER — Encounter (HOSPITAL_COMMUNITY)
Admission: RE | Admit: 2015-01-05 | Discharge: 2015-01-05 | Disposition: A | Discharge status: Home / Self Care | Payer: Medicare Other | Source: Ambulatory Visit | Attending: Cardiology | Admitting: Cardiology

## 2015-01-05 DIAGNOSIS — I509 Heart failure, unspecified: Secondary | ICD-10-CM | POA: Diagnosis not present

## 2015-01-07 ENCOUNTER — Encounter (HOSPITAL_COMMUNITY)
Admission: RE | Admit: 2015-01-07 | Discharge: 2015-01-07 | Disposition: A | Discharge status: Home / Self Care | Payer: Medicare Other | Source: Ambulatory Visit | Attending: Cardiology | Admitting: Cardiology

## 2015-01-07 DIAGNOSIS — I509 Heart failure, unspecified: Secondary | ICD-10-CM | POA: Diagnosis not present

## 2015-01-09 ENCOUNTER — Encounter (HOSPITAL_COMMUNITY)
Admission: RE | Admit: 2015-01-09 | Discharge: 2015-01-09 | Disposition: A | Discharge status: Home / Self Care | Payer: Medicare Other | Source: Ambulatory Visit | Attending: Cardiology | Admitting: Cardiology

## 2015-01-09 DIAGNOSIS — I509 Heart failure, unspecified: Secondary | ICD-10-CM | POA: Diagnosis not present

## 2015-01-12 ENCOUNTER — Encounter (HOSPITAL_COMMUNITY)
Admission: RE | Admit: 2015-01-12 | Discharge: 2015-01-12 | Disposition: A | Discharge status: Home / Self Care | Payer: Medicare Other | Source: Ambulatory Visit | Attending: Cardiology | Admitting: Cardiology

## 2015-01-12 DIAGNOSIS — I509 Heart failure, unspecified: Secondary | ICD-10-CM | POA: Diagnosis not present

## 2015-01-14 ENCOUNTER — Encounter (HOSPITAL_COMMUNITY)
Admission: RE | Admit: 2015-01-14 | Discharge: 2015-01-14 | Disposition: A | Discharge status: Home / Self Care | Payer: Medicare Other | Source: Ambulatory Visit | Attending: Cardiology | Admitting: Cardiology

## 2015-01-14 DIAGNOSIS — I509 Heart failure, unspecified: Secondary | ICD-10-CM | POA: Diagnosis not present

## 2015-01-16 ENCOUNTER — Encounter (HOSPITAL_COMMUNITY)
Admission: RE | Admit: 2015-01-16 | Discharge: 2015-01-16 | Disposition: A | Discharge status: Home / Self Care | Payer: Medicare Other | Source: Ambulatory Visit | Attending: Cardiology | Admitting: Cardiology

## 2015-01-16 DIAGNOSIS — I509 Heart failure, unspecified: Secondary | ICD-10-CM | POA: Diagnosis not present

## 2015-01-19 ENCOUNTER — Encounter (HOSPITAL_COMMUNITY)
Admission: RE | Admit: 2015-01-19 | Discharge: 2015-01-19 | Disposition: A | Discharge status: Home / Self Care | Payer: Medicare Other | Source: Ambulatory Visit | Attending: Cardiology | Admitting: Cardiology

## 2015-01-19 DIAGNOSIS — I509 Heart failure, unspecified: Secondary | ICD-10-CM | POA: Diagnosis not present

## 2015-01-20 NOTE — Progress Notes (Signed)
3 beat run noted with exercise at cardiac rehab. Patient asymptomatic. Vital signs stable. ECG tracing faxed to Dr Francesca Oman office for review. Will continue to monitor the patient throughout  the program.

## 2015-01-21 ENCOUNTER — Encounter (HOSPITAL_COMMUNITY)
Admission: RE | Admit: 2015-01-21 | Discharge: 2015-01-21 | Disposition: A | Discharge status: Home / Self Care | Payer: Medicare Other | Source: Ambulatory Visit | Attending: Cardiology | Admitting: Cardiology

## 2015-01-21 DIAGNOSIS — I509 Heart failure, unspecified: Secondary | ICD-10-CM | POA: Diagnosis not present

## 2015-01-23 ENCOUNTER — Encounter (HOSPITAL_COMMUNITY)
Admission: RE | Admit: 2015-01-23 | Discharge: 2015-01-23 | Disposition: A | Discharge status: Home / Self Care | Payer: Medicare Other | Source: Ambulatory Visit | Attending: Cardiology | Admitting: Cardiology

## 2015-01-23 DIAGNOSIS — I509 Heart failure, unspecified: Secondary | ICD-10-CM | POA: Diagnosis not present

## 2015-01-26 ENCOUNTER — Encounter (HOSPITAL_COMMUNITY)
Admission: RE | Admit: 2015-01-26 | Discharge: 2015-01-26 | Disposition: A | Discharge status: Home / Self Care | Payer: Medicare Other | Source: Ambulatory Visit | Attending: Cardiology | Admitting: Cardiology

## 2015-01-26 DIAGNOSIS — I509 Heart failure, unspecified: Secondary | ICD-10-CM | POA: Diagnosis not present

## 2015-01-28 ENCOUNTER — Encounter (HOSPITAL_COMMUNITY): Payer: Medicare Other

## 2015-01-30 ENCOUNTER — Encounter (HOSPITAL_COMMUNITY): Payer: Medicare Other

## 2015-02-02 ENCOUNTER — Encounter (HOSPITAL_COMMUNITY)
Admission: RE | Admit: 2015-02-02 | Discharge: 2015-02-02 | Disposition: A | Discharge status: Home / Self Care | Payer: Medicare Other | Source: Ambulatory Visit | Attending: Cardiology | Admitting: Cardiology

## 2015-02-02 DIAGNOSIS — I509 Heart failure, unspecified: Secondary | ICD-10-CM | POA: Diagnosis not present

## 2015-02-04 ENCOUNTER — Encounter (HOSPITAL_COMMUNITY)
Admission: RE | Admit: 2015-02-04 | Discharge: 2015-02-04 | Disposition: A | Discharge status: Home / Self Care | Payer: Medicare Other | Source: Ambulatory Visit | Attending: Cardiology | Admitting: Cardiology

## 2015-02-04 DIAGNOSIS — I509 Heart failure, unspecified: Secondary | ICD-10-CM | POA: Diagnosis not present

## 2015-02-06 ENCOUNTER — Encounter (HOSPITAL_COMMUNITY)
Admission: RE | Admit: 2015-02-06 | Discharge: 2015-02-06 | Disposition: A | Discharge status: Home / Self Care | Payer: Medicare Other | Source: Ambulatory Visit | Attending: Cardiology | Admitting: Cardiology

## 2015-02-06 DIAGNOSIS — I509 Heart failure, unspecified: Secondary | ICD-10-CM | POA: Diagnosis not present

## 2015-02-06 NOTE — Progress Notes (Signed)
Pt reported increase in stamina and decrease in dyspnea on exertion noticed today with activities at his workplace.  Pt noted he was easily able to climb flight of stairs without dyspnea or fatigue.  Normally pt reports he would have to stop and rest at top of stairs.  Pt congratulated on his exercise efforts at cardiac rehab and encouraged to continue.

## 2015-02-09 ENCOUNTER — Encounter (HOSPITAL_COMMUNITY)
Admission: RE | Admit: 2015-02-09 | Discharge: 2015-02-09 | Disposition: A | Discharge status: Home / Self Care | Payer: Medicare Other | Source: Ambulatory Visit | Attending: Cardiology | Admitting: Cardiology

## 2015-02-09 DIAGNOSIS — I509 Heart failure, unspecified: Secondary | ICD-10-CM | POA: Diagnosis not present

## 2015-02-09 NOTE — Progress Notes (Addendum)
3 beat run and couplet noted today at cardiac rehab. Vital signs stable. Patient encouraged to watch caffeine intake. Will fax exercise flow sheets to Dr. Francesca Oman office for review with today's ECG tracing. Patient asymptomatic.

## 2015-02-11 ENCOUNTER — Encounter (HOSPITAL_COMMUNITY)
Admission: RE | Admit: 2015-02-11 | Discharge: 2015-02-11 | Disposition: A | Discharge status: Home / Self Care | Payer: Medicare Other | Source: Ambulatory Visit | Attending: Cardiology | Admitting: Cardiology

## 2015-02-11 DIAGNOSIS — I509 Heart failure, unspecified: Secondary | ICD-10-CM | POA: Diagnosis not present

## 2015-02-11 NOTE — Progress Notes (Signed)
[name] 74 y.o. male Nutrition Note Spoke with pt. Nutrition Plan and Nutrition Survey goals reviewed with pt. Pt is working toward following the Therapeutic Lifestyle Changes diet. Pt wants to lose wt. Pt has been trying to lose wt by "making changes to my diet." Per pt, his wt is 117.3 kg, which is down 4.8 kg (10.6 lb) over the past 6 weeks. Wt loss tips reviewed.  Pt has CHF. Pt is watching sodium intake closely by choosing less processed food and eating out less frequently. Pt expressed understanding of the information reviewed. Pt aware of nutrition education classes offered. No results found for: HGBA1C Nutrition Diagnosis ? Food-and nutrition-related knowledge deficit related to lack of exposure to information as related to diagnosis of: ? CVD ?  ? Obesity related to excessive energy intake as evidenced by a BMI of 39.8  Nutrition RX/ Estimated Daily Nutrition Needs for: wt loss  1750-2250 Kcal, 45-60 gm fat, 11-15 gm sat fat, 1.7-2.2 gm trans-fat, <1500 mg sodium Nutrition Intervention ? Pt's individual nutrition plan reviewed with pt. ? Benefits of adopting Therapeutic Lifestyle Changes discussed when Medficts reviewed. ? Pt to attend the Portion Distortion class  ? Pt to attend the   ? Nutrition I class - met; 12/23/14                  ? Nutrition II class     ? Pt given handouts for: ? Nutrition I class ? Nutrition II class ? Continue client-centered nutrition education by RD, as part of interdisciplinary care. Goal(s) ? Pt to identify and limit food sources of saturated fat, trans fat, and cholesterol ? Pt to identify food quantities necessary to achieve: ? wt loss to a goal wt of 244-262 lb (111.2-119.4 kg) at graduation from cardiac rehab.  Monitor and Evaluate progress toward nutrition goal with team. Nutrition Risk: Change to Moderate Derek Mound, M.Ed, RD, LDN, CDE 02/11/2015 3:53 PM

## 2015-02-13 ENCOUNTER — Encounter (HOSPITAL_COMMUNITY)
Admission: RE | Admit: 2015-02-13 | Discharge: 2015-02-13 | Disposition: A | Discharge status: Home / Self Care | Payer: Medicare Other | Source: Ambulatory Visit | Attending: Cardiology | Admitting: Cardiology

## 2015-02-13 DIAGNOSIS — I509 Heart failure, unspecified: Secondary | ICD-10-CM | POA: Diagnosis not present

## 2015-02-16 ENCOUNTER — Encounter (HOSPITAL_COMMUNITY)
Admission: RE | Admit: 2015-02-16 | Discharge: 2015-02-16 | Disposition: A | Discharge status: Home / Self Care | Payer: Medicare Other | Source: Ambulatory Visit | Attending: Cardiology | Admitting: Cardiology

## 2015-02-16 DIAGNOSIS — I509 Heart failure, unspecified: Secondary | ICD-10-CM | POA: Diagnosis not present

## 2015-02-17 ENCOUNTER — Encounter: Payer: Self-pay | Admitting: Cardiology

## 2015-02-18 ENCOUNTER — Encounter (HOSPITAL_COMMUNITY)
Admission: RE | Admit: 2015-02-18 | Discharge: 2015-02-18 | Disposition: A | Discharge status: Home / Self Care | Payer: Medicare Other | Source: Ambulatory Visit | Attending: Cardiology | Admitting: Cardiology

## 2015-02-18 DIAGNOSIS — I509 Heart failure, unspecified: Secondary | ICD-10-CM | POA: Diagnosis not present

## 2015-02-20 ENCOUNTER — Ambulatory Visit (HOSPITAL_COMMUNITY): Payer: Medicare Other | Attending: Cardiovascular Disease | Admitting: Cardiology

## 2015-02-20 ENCOUNTER — Encounter (HOSPITAL_COMMUNITY)
Admission: RE | Admit: 2015-02-20 | Discharge: 2015-02-20 | Disposition: A | Discharge status: Home / Self Care | Payer: Medicare Other | Source: Ambulatory Visit | Attending: Cardiology | Admitting: Cardiology

## 2015-02-20 DIAGNOSIS — I5022 Chronic systolic (congestive) heart failure: Secondary | ICD-10-CM | POA: Insufficient documentation

## 2015-02-20 DIAGNOSIS — I429 Cardiomyopathy, unspecified: Secondary | ICD-10-CM | POA: Insufficient documentation

## 2015-02-20 DIAGNOSIS — R0609 Other forms of dyspnea: Secondary | ICD-10-CM

## 2015-02-20 DIAGNOSIS — I420 Dilated cardiomyopathy: Secondary | ICD-10-CM | POA: Insufficient documentation

## 2015-02-20 DIAGNOSIS — R0600 Dyspnea, unspecified: Secondary | ICD-10-CM

## 2015-02-20 DIAGNOSIS — I428 Other cardiomyopathies: Secondary | ICD-10-CM

## 2015-02-20 DIAGNOSIS — I509 Heart failure, unspecified: Secondary | ICD-10-CM | POA: Diagnosis not present

## 2015-02-20 DIAGNOSIS — I10 Essential (primary) hypertension: Secondary | ICD-10-CM | POA: Diagnosis not present

## 2015-02-20 NOTE — Progress Notes (Signed)
Limited Echo performed.

## 2015-02-23 ENCOUNTER — Encounter (HOSPITAL_COMMUNITY): Payer: Medicare Other

## 2015-02-25 ENCOUNTER — Encounter (HOSPITAL_COMMUNITY)
Admission: RE | Admit: 2015-02-25 | Discharge: 2015-02-25 | Disposition: A | Discharge status: Home / Self Care | Payer: Medicare Other | Source: Ambulatory Visit | Attending: Cardiology | Admitting: Cardiology

## 2015-02-25 DIAGNOSIS — I509 Heart failure, unspecified: Secondary | ICD-10-CM | POA: Diagnosis not present

## 2015-02-26 ENCOUNTER — Ambulatory Visit (INDEPENDENT_AMBULATORY_CARE_PROVIDER_SITE_OTHER): Payer: Medicare Other | Admitting: Cardiology

## 2015-02-26 ENCOUNTER — Encounter: Payer: Self-pay | Admitting: Cardiology

## 2015-02-26 VITALS — BP 90/66 | HR 56 | Ht 71.0 in | Wt 258.0 lb

## 2015-02-26 DIAGNOSIS — I255 Ischemic cardiomyopathy: Secondary | ICD-10-CM | POA: Diagnosis not present

## 2015-02-26 DIAGNOSIS — I10 Essential (primary) hypertension: Secondary | ICD-10-CM

## 2015-02-26 DIAGNOSIS — E785 Hyperlipidemia, unspecified: Secondary | ICD-10-CM | POA: Diagnosis not present

## 2015-02-26 DIAGNOSIS — I5022 Chronic systolic (congestive) heart failure: Secondary | ICD-10-CM

## 2015-02-26 MED ORDER — SIMVASTATIN 10 MG PO TABS: 10.0000 mg | ORAL_TABLET | Freq: Every day | ORAL | Status: DC

## 2015-02-26 NOTE — Patient Instructions (Addendum)
Medication Instructions:  Your physician has recommended you make the following change in your medication:  DECREASE Zocor to 10mg  daily  Labwork: None   Testing/Procedures: Your physician has requested that you have a cardiac MRI. Cardiac MRI uses a computer to create images of your heart as its beating, producing both still and moving pictures of your heart and major blood vessels. For further information please visit [email or web address] Please follow the instruction sheet given to you today for more information.(To be scheduled a day Dr.Nelson is at the hospital)  Your physician has recommended that you have a cardiopulmonary stress test (CPX). CPX testing is a non-invasive measurement of heart and lung function. It replaces a traditional treadmill stress test. This type of test provides a tremendous amount of information that relates not only to your present condition but also for future outcomes. This test combines measurements of you ventilation, respiratory gas exchange in the lungs, electrocardiogram (EKG), blood pressure and physical response before, during, and following an exercise protocol.   Follow-Up: Your physician recommends that you schedule a follow-up appointment in: 3 months with Dr.Nelson  You have [facility] (for [facility] evaluation)  You have been referred to EP for ICD consideration     Any Other Special Instructions Will Be Listed Below (If Applicable).

## 2015-02-26 NOTE — Progress Notes (Signed)
Patient ID: SHANNEN VERNON, male   DOB: Jun 11, 1941, 74 y.o.   MRN: [id number]     Patient Name: [name] Date of Encounter: 02/26/2015  Primary Care Provider:  Orpah Melter, MD Primary Cardiologist:  Dorothy Spark  Problem List   Past Medical History  Diagnosis Date  . Obesity   . Hyperlipidemia   . LBBB (left bundle branch block)   . Prediabetes   . Hyperplastic colon polyp   . Degenerative disc disease, thoracic   . Acid reflux    Past Surgical History  Procedure Laterality Date  . Excision of merkel cell  2006  . Left knee surgery  1960  . Colonoscopy  2009  . Left and right cataract sugery     Allergies  Allergies  Allergen Reactions  . 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 The patient still works as an Chief Financial Officer and walks daily and has noticed in the last 2-3 months that is becoming more and more difficult. He gets short of breath with moderate exertion and was ignoring it for a while as he attributed to deconditioning and obesity. He denies any chest pain, he has just been prescribed low-dose Lasix for lower extremity edema predominantly in his right leg as his lymph nodes were removed during treatment of his cancer. He denies any palpitations or syncope. His father had myocardial infarction at age 31. His mom died of complications of lupus. Both of his kids that are a their late 19s have hypertension. His wife says that he snores and has sleep apnea but has never been evaluated for it.  11/25/2014 - the patient is coming after 3 weeks, he underwentLexiscan nuclear stress test that showed no prior scar and no ischemia but dilated left ventricle with moderately to severely impaired systolic function. Patient was started on lisinopril 5 mg  daily that doesn't give him any side effects but doesn't seem any on improvement yet. He has stable right lower extremity lymphedema his left lower extremity doesn't have any edema.no orthopnea or paroxysmal nocturnal dyspnea or dyspnea on exertion for example 2 flight of stairs will make him short of breath. No chest pain. No palpitations.  02/26/2015 - 3 months follow up, the patient is feeling better,. He has been enrolled in cardiac rehab and is doing good progress. He is able to do all ADL, but with limitations, his most recent max METS 3.8. Denies palpitations or syncope. NO CP. He has stable LE lymphedema, controlled by a low dose lasix. We had to decrease his lisinopril dose as he was too hypotensive at the rehab.   Home Medications  Prior to Admission medications   Medication Sig Start Date End Date Taking? Authorizing Provider  aspirin 81 MG tablet Take 81 mg by mouth daily.   Yes Historical Provider, MD  fluorouracil (EFUDEX) 5 % cream Apply 5 application topically as needed. Crusty patches on face 10/15/14  Yes Historical Provider, MD  furosemide (LASIX) 20 MG tablet Take 20 mg by mouth daily. 11/01/14  Yes Historical Provider, MD  ibuprofen (ADVIL,MOTRIN) 200 MG tablet Take 200 mg by mouth as needed for moderate pain.   Yes Historical Provider, MD  meloxicam (MOBIC) 15 MG tablet Take 15 mg by mouth as needed for pain. For pain   Yes Historical  Provider, MD  metoprolol tartrate (LOPRESSOR) 25 MG tablet Take 25 mg by mouth 2 (two) times daily.   Yes Historical Provider, MD  omeprazole (PRILOSEC) 20 MG capsule Take 20 mg by mouth daily.   Yes Historical Provider, MD  simvastatin (ZOCOR) 20 MG tablet Take 20 mg by mouth daily.   Yes Historical Provider, MD    Family History  Family History  Problem Relation Age of Onset  . Cancer Father     prostate  . Cancer Mother     breast  . Lupus Mother   . Stroke Sister   . Thyroid disease Sister     Social History  History   Social  History  . Marital Status: Married    Spouse Name: bridgette  . Number of Children: 2  . Years of Education: college   Occupational History  . [employer]    Social History Main Topics  . Smoking status: Never Smoker   . Smokeless tobacco: Not on file  . Alcohol Use: 0.0 oz/week    0 Standard drinks or equivalent per week     Comment: wine occasionally  . Drug Use: No  . Sexual Activity: Not on file   Other Topics Concern  . Not on file   Social History Narrative     Review of Systems, as per HPI, otherwise negative General:  No chills, fever, night sweats or weight changes.  Cardiovascular:  No chest pain, dyspnea on exertion, edema, orthopnea, palpitations, paroxysmal nocturnal dyspnea. Dermatological: No rash, lesions/masses Respiratory: No cough, dyspnea Urologic: No hematuria, dysuria Abdominal:   No nausea, vomiting, diarrhea, bright red blood per rectum, melena, or hematemesis Neurologic:  No visual changes, wkns, changes in mental status. All other systems reviewed and are otherwise negative except as noted above.  Physical Exam  Blood pressure 90/66, pulse 56, height 5\' 11"  (1.803 m), weight 258 lb (117.028 kg), SpO2 97 %.  General: Pleasant, NAD Psych: Normal affect. Neuro: Alert and oriented X 3. Moves all extremities spontaneously. HEENT: Normal  Neck: Supple without bruits or JVD. Lungs:  Resp regular and unlabored, CTA. Heart: RRR no s3, s4, or murmurs. Abdomen: Soft, non-tender, non-distended, BS + x 4.  Extremities: No clubbing, cyanosis, RLE lymphedema. DP/PT/Radials 2+ and equal bilaterally.  Labs:  No results for input(s): CKTOTAL, CKMB, TROPONINI in the last 72 hours. Lab Results  Component Value Date   WBC 4.1 11/25/2014   HGB 13.9 11/25/2014   HCT 39.8 11/25/2014   MCV 96.6 11/25/2014   PLT 136.0* 11/25/2014    No results found for: DDIMER Invalid input(s): POCBNP    Component Value Date/Time   NA 140 11/25/2014 0747    K 4.0 11/25/2014 0747   CL 106 11/25/2014 0747   CO2 29 11/25/2014 0747   GLUCOSE 106* 11/25/2014 0747   BUN 18 11/25/2014 0747   CREATININE 1.07 11/25/2014 0747   CALCIUM 9.3 11/25/2014 0747   PROT 6.6 11/25/2014 0747   ALBUMIN 4.2 11/25/2014 0747   AST 17 11/25/2014 0747   ALT 22 11/25/2014 0747   ALKPHOS 50 11/25/2014 0747   BILITOT 0.6 11/25/2014 0747   Lab Results  Component Value Date   CHOL 114 11/25/2014   HDL 42.20 11/25/2014   LDLCALC 59 11/25/2014   TRIG 66.0 11/25/2014    Accessory Clinical Findings  Echocardiogram - 11/11/2014 Left ventricle: The cavity size was normal. There was mild focal basal hypertrophy of the septum. Systolic function was severely reduced. The estimated  ejection fraction was in the range of 25% to 30%. Diffuse hypokinesis. There is akinesis of the inferior myocardium. Acoustic contrast opacification revealed no evidence ofthrombus. - Mitral valve: There was moderate regurgitation directed posteriorly. - Left atrium: The atrium was moderately dilated. ECG -sinus rhythm, first-degree AV block, left bundle branch block and very frequent PVCs.  Lexiscan 11/11/2014 Impression Exercise Capacity: Adenosine study with no exercise. BP Response: Normal blood pressure response. Clinical Symptoms: No chest pain or dyspnea. ECG Impression: Baseline: LBBB. EKG uninterpretable due to LBBB at rest and stress. Frequent PVCs, couplets. Comparison with Prior Nuclear Study: No previous nuclear study performed  Overall Impression: Low risk stress nuclear study with normal perfusion; significant LVE.  LV Ejection Fraction: Study not gated. LV Wall Motion: Study not gated; suggest echo to assess LV function.  Kirk Ruths    Assessment & Plan  74 year old gentleman  1. New diagnosis of nonischemic cardiomyopathy, chronic systolic CHF with left ventricular ejection fraction 25-30%, worsened on follow up echocardiogram with LVEF  10-15% and moderately dilated LV. NYHA II_III.  The patient was started on lisinopril 5 mg daily, but had to be decreased due to hypotension.  Continue metoprolol 25 mg po BID.  Unable to add spironolactone, continue low dose lasix 20 mg po daily.   We will order cardiac MRI to evaluate for LVEF, rule out apical thrombus.  We will schedule cardiopulmonary testing and refer to CHF clinic. We will refer to EP clinic for ICD implantation.    Continue cardiac rehab, explained necessity of ongoing exercise, intensity and frequency.   2. Hypertension - currently hypotensive, asymptomatic.   3. Hyperlipidemia - at goal, we will decrease simvastatin to 10 mg po daily.   Follow up in 3 months.   Dorothy Spark, MD, [facility] 02/26/2015, 8:15 AM

## 2015-02-27 ENCOUNTER — Encounter (HOSPITAL_COMMUNITY)
Admission: RE | Admit: 2015-02-27 | Discharge: 2015-02-27 | Disposition: A | Discharge status: Home / Self Care | Payer: Medicare Other | Source: Ambulatory Visit | Attending: Cardiology | Admitting: Cardiology

## 2015-02-27 DIAGNOSIS — I509 Heart failure, unspecified: Secondary | ICD-10-CM | POA: Diagnosis not present

## 2015-03-02 ENCOUNTER — Encounter (HOSPITAL_COMMUNITY)
Admission: RE | Admit: 2015-03-02 | Discharge: 2015-03-02 | Disposition: A | Discharge status: Home / Self Care | Payer: Medicare Other | Source: Ambulatory Visit | Attending: Cardiology | Admitting: Cardiology

## 2015-03-02 DIAGNOSIS — I509 Heart failure, unspecified: Secondary | ICD-10-CM | POA: Diagnosis not present

## 2015-03-03 ENCOUNTER — Encounter: Payer: Self-pay | Admitting: Cardiology

## 2015-03-04 ENCOUNTER — Encounter (HOSPITAL_COMMUNITY)
Admission: RE | Admit: 2015-03-04 | Discharge: 2015-03-04 | Disposition: A | Discharge status: Home / Self Care | Payer: Medicare Other | Source: Ambulatory Visit | Attending: Cardiology | Admitting: Cardiology

## 2015-03-04 DIAGNOSIS — I509 Heart failure, unspecified: Secondary | ICD-10-CM | POA: Diagnosis not present

## 2015-03-06 ENCOUNTER — Encounter (HOSPITAL_COMMUNITY)
Admission: RE | Admit: 2015-03-06 | Discharge: 2015-03-06 | Disposition: A | Discharge status: Home / Self Care | Payer: Medicare Other | Source: Ambulatory Visit | Attending: Cardiology | Admitting: Cardiology

## 2015-03-06 DIAGNOSIS — I509 Heart failure, unspecified: Secondary | ICD-10-CM | POA: Diagnosis not present

## 2015-03-09 ENCOUNTER — Encounter (HOSPITAL_COMMUNITY)
Admission: RE | Admit: 2015-03-09 | Discharge: 2015-03-09 | Disposition: A | Discharge status: Home / Self Care | Payer: Medicare Other | Source: Ambulatory Visit | Attending: Cardiology | Admitting: Cardiology

## 2015-03-09 DIAGNOSIS — I509 Heart failure, unspecified: Secondary | ICD-10-CM | POA: Diagnosis not present

## 2015-03-11 ENCOUNTER — Encounter (HOSPITAL_COMMUNITY)
Admission: RE | Admit: 2015-03-11 | Discharge: 2015-03-11 | Disposition: A | Discharge status: Home / Self Care | Payer: Medicare Other | Source: Ambulatory Visit | Attending: Cardiology | Admitting: Cardiology

## 2015-03-11 DIAGNOSIS — I509 Heart failure, unspecified: Secondary | ICD-10-CM | POA: Diagnosis not present

## 2015-03-12 ENCOUNTER — Other Ambulatory Visit: Payer: Self-pay

## 2015-03-13 ENCOUNTER — Other Ambulatory Visit: Payer: Self-pay | Admitting: Cardiology

## 2015-03-13 ENCOUNTER — Ambulatory Visit (HOSPITAL_COMMUNITY)
Admission: RE | Admit: 2015-03-13 | Discharge: 2015-03-13 | Disposition: A | Discharge status: Home / Self Care | Payer: Medicare Other | Source: Ambulatory Visit | Attending: Cardiology | Admitting: Cardiology

## 2015-03-13 ENCOUNTER — Encounter (HOSPITAL_COMMUNITY)
Admission: RE | Admit: 2015-03-13 | Discharge: 2015-03-13 | Disposition: A | Discharge status: Home / Self Care | Payer: Medicare Other | Source: Ambulatory Visit | Attending: Cardiology | Admitting: Cardiology

## 2015-03-13 DIAGNOSIS — I252 Old myocardial infarction: Secondary | ICD-10-CM | POA: Insufficient documentation

## 2015-03-13 DIAGNOSIS — I509 Heart failure, unspecified: Secondary | ICD-10-CM | POA: Diagnosis not present

## 2015-03-13 DIAGNOSIS — I340 Nonrheumatic mitral (valve) insufficiency: Secondary | ICD-10-CM | POA: Insufficient documentation

## 2015-03-13 DIAGNOSIS — I255 Ischemic cardiomyopathy: Secondary | ICD-10-CM

## 2015-03-13 DIAGNOSIS — Z9221 Personal history of antineoplastic chemotherapy: Secondary | ICD-10-CM | POA: Diagnosis not present

## 2015-03-13 LAB — CREATININE, SERUM
Creatinine, Ser: 1.09 mg/dL (ref 0.61–1.24)
GFR calc Af Amer: >60 mL/min (ref >60)
GFR calc non Af Amer: >60 mL/min (ref >60)

## 2015-03-13 MED ORDER — GADOBENATE DIMEGLUMINE 529 MG/ML IV SOLN
35.0000 mL | Freq: Once | INTRAVENOUS | Status: AC
  Administered 2015-03-13: 35 mL via INTRAVENOUS

## 2015-03-16 ENCOUNTER — Encounter: Payer: Self-pay | Admitting: Internal Medicine

## 2015-03-16 ENCOUNTER — Encounter: Payer: Self-pay | Admitting: *Deleted

## 2015-03-16 ENCOUNTER — Encounter (HOSPITAL_COMMUNITY)
Admission: RE | Admit: 2015-03-16 | Discharge: 2015-03-16 | Disposition: A | Discharge status: Home / Self Care | Payer: Medicare Other | Source: Ambulatory Visit | Attending: Cardiology | Admitting: Cardiology

## 2015-03-16 ENCOUNTER — Ambulatory Visit (INDEPENDENT_AMBULATORY_CARE_PROVIDER_SITE_OTHER): Payer: Medicare Other | Admitting: Internal Medicine

## 2015-03-16 VITALS — BP 108/80 | HR 83 | Ht 71.0 in | Wt 256.6 lb

## 2015-03-16 DIAGNOSIS — I519 Heart disease, unspecified: Secondary | ICD-10-CM | POA: Diagnosis not present

## 2015-03-16 DIAGNOSIS — I5022 Chronic systolic (congestive) heart failure: Secondary | ICD-10-CM | POA: Diagnosis not present

## 2015-03-16 DIAGNOSIS — I428 Other cardiomyopathies: Secondary | ICD-10-CM | POA: Insufficient documentation

## 2015-03-16 DIAGNOSIS — I429 Cardiomyopathy, unspecified: Secondary | ICD-10-CM | POA: Diagnosis not present

## 2015-03-16 DIAGNOSIS — I509 Heart failure, unspecified: Secondary | ICD-10-CM | POA: Diagnosis not present

## 2015-03-16 DIAGNOSIS — I255 Ischemic cardiomyopathy: Secondary | ICD-10-CM

## 2015-03-16 DIAGNOSIS — I447 Left bundle-branch block, unspecified: Secondary | ICD-10-CM | POA: Insufficient documentation

## 2015-03-16 LAB — CBC WITH DIFFERENTIAL/PLATELET
Basophils Absolute: 0 10*3/uL (ref 0.0–0.1)
Basophils Relative: 0.1 % (ref 0.0–3.0)
EOS ABS: 0.1 10*3/uL (ref 0.0–0.7)
EOS PCT: 1.6 % (ref 0.0–5.0)
HEMATOCRIT: 41.2 % (ref 39.0–52.0)
Hemoglobin: 14 g/dL (ref 13.0–17.0)
Lymphocytes Relative: 14.4 % (ref 12.0–46.0)
Lymphs Abs: 0.7 10*3/uL (ref 0.7–4.0)
MCHC: 34 g/dL (ref 30.0–36.0)
MCV: 98.1 fl (ref 78.0–100.0)
Monocytes Absolute: 0.5 10*3/uL (ref 0.1–1.0)
Monocytes Relative: 9.9 % (ref 3.0–12.0)
NEUTROS ABS: 3.4 10*3/uL (ref 1.4–7.7)
NEUTROS PCT: 74 % (ref 43.0–77.0)
Platelets: 144 10*3/uL — ABNORMAL LOW (ref 150.0–400.0)
RBC: 4.2 Mil/uL — ABNORMAL LOW (ref 4.22–5.81)
RDW: 13.6 % (ref 11.5–15.5)
WBC: 4.6 10*3/uL (ref 4.0–10.5)

## 2015-03-16 LAB — BASIC METABOLIC PANEL
BUN: 18 mg/dL (ref 6–23)
CALCIUM: 9.5 mg/dL (ref 8.4–10.5)
CO2: 28 meq/L (ref 19–32)
Chloride: 106 mEq/L (ref 96–112)
Creatinine, Ser: 1.07 mg/dL (ref 0.40–1.50)
GFR: 71.85 mL/min (ref >60.00)
Glucose, Bld: 106 mg/dL — ABNORMAL HIGH (ref 70–99)
POTASSIUM: 4 meq/L (ref 3.5–5.1)
Sodium: 140 mEq/L (ref 135–145)

## 2015-03-16 NOTE — Patient Instructions (Addendum)
Medication Instructions:  Your physician recommends that you continue on your current medications as directed. Please refer to the Current Medication list given to you today.      Labwork: Your physician recommends that you return for lab work today BMP/CBC   Testing/Procedures: Your physician has recommended that you have a defibrillator inserted. An implantable cardioverter defibrillator (ICD) is a small device that is placed in your chest or, in rare cases, your abdomen. This device uses electrical pulses or shocks to help control life-threatening, irregular heartbeats that could lead the heart to suddenly stop beating (sudden cardiac arrest). Leads are attached to the ICD that goes into your heart. This is done in the hospital and usually requires an overnight stay. Please see the instruction sheet given to you today for more information.  See instruction sheet for procedure    Follow-Up:  Your physician recommends that you schedule a follow-up appointment in: 10 days from 04/02/15 in device clinic for wound check   Any Other Special Instructions Will Be Listed Below (If Applicable).   Cardioverter Defibrillator Implantation An implantable cardioverter defibrillator (ICD) is a small, lightweight, battery-powered device that is placed (implanted) under the skin in the chest or abdomen. Your caregiver may prescribe an ICD if:  You have had an irregular heart rhythm (arrhythmia) that originated in the lower chambers of the heart (ventricles).  Your heart has been damaged by a disease (such as coronary artery disease) or heart condition (such as a heart attack). An ICD consists of a battery that lasts several years, a small computer called a pulse generator, and wires called leads that go into the heart. It is used to detect and correct two dangerous arrhythmias: a rapid heart rhythm (tachycardia) and an arrhythmia in which the ventricles contract in an uncoordinated way (fibrillation).  When an ICD detects tachycardia, it sends an electrical signal to the heart that restores the heartbeat to normal (cardioversion). This signal is usually painless. If cardioversion does not work or if the ICD detects fibrillation, it delivers a small electrical shock to the heart (defibrillation) to restart the heart. The shock may feel like a strong jolt in the chest.ICDs may be programmed to correct other problems. Sometimes, ICDs are programmed to act as another type of implantable device called a pacemaker. Pacemakers are used to treat a slow heartbeat (bradycardia). LET YOUR CAREGIVER KNOW ABOUT:  Any allergies you have.  All medicines you are taking, including vitamins, herbs, eyedrops, and over-the-counter medicines and creams.  Previous problems you or members of your family have had with the use of anesthetics.  Any blood disorders you have had.  Other health problems you have. RISKS AND COMPLICATIONS Generally, the procedure to implant an ICD is safe. However, as with any surgical procedure, complications can occur. Possible complications associated with implanting an ICD include:  Swelling, bleeding, or bruising at the site where the ICD was implanted.  Infection at the site where the ICD was implanted.  A reaction to medicine used during the procedure.  Nerve, heart, or blood vessel damage.  Blood clots. BEFORE THE PROCEDURE  You may need to have blood tests, heart tests, or a chest X-ray done before the day of the procedure.  Ask your caregiver about changing or stopping your regular medicines.  Make plans to have someone drive you home. You may need to stay in the hospital overnight after the procedure.  Stop smoking at least 24 hours before the procedure.  Take a bath or shower  the night before the procedure. You may need to scrub your chest or abdomen with a special type of soap.  Do not eat or drink before your procedure for as long as directed by your caregiver.  Ask if it is okay to take any needed medicine with a small sip of water. PROCEDURE  The procedure to implant an ICD in your chest or abdomen is usually done at a hospital in a room that has a large X-ray machine called a fluoroscope. The machine will be above you during the procedure. It will help your caregiver see your heart during the procedure. Implanting an ICD usually takes 1-3 hours. Before the procedure:   Small monitors will be put on your body. They will be used to check your heart, blood pressure, and oxygen level.  A needle will be put into a vein in your hand or arm. This is called an intravenous (IV) access tube. Fluids and medicine will flow directly into your body through the IV tube.  Your chest or abdomen will be cleaned with a germ-killing (antiseptic) solution. The area may be shaved.  You may be given medicine to help you relax (sedative).  You will be given a medicine called a local anesthetic. This medicine will make the surgical site numb while the ICD is implanted. You will be sleepy but awake during the procedure. After you are numb the procedure will begin. The caregiver will:  Make a small cut (incision). This will make a pocket deep under your skin that will hold the pulse generator.  Guide the leads through a large blood vessel into your heart and attach them to the heart muscles. Depending on the ICD, the leads may go into one ventricle or they may go to both ventricles and into an upper chamber of the heart (atrium).  Test the ICD.  Close the incision with stitches, glue, or staples. AFTER THE PROCEDURE  You may feel pain. Some pain is normal. It may last a few days.  You may stay in a recovery area until the local anesthetic has worn off. Your blood pressure and pulse will be checked often. You will be taken to a room where your heart will be monitored.  A chest X-ray will be taken. This is done to check that the cardioverter defibrillator is in the right  place.  You may stay in the hospital overnight.  A slight bump may be seen over the skin where the ICD was placed. Sometimes, it is possible to feel the ICD under the skin. This is normal.  In the months and years afterward, your caregiver will check the device, the leads, and the battery every few months. Eventually, when the battery is low, the ICD will be replaced. Document Released: 07/02/2002 Document Revised: 07/31/2013 Document Reviewed: 10/29/2012 [facility] Patient Information 2015 [place], [place]. This information is not intended to replace advice given to you by your health care provider. Make sure you discuss any questions you have with your health care provider.

## 2015-03-16 NOTE — Progress Notes (Signed)
Electrophysiology Office Note   Date:  03/16/2015   ID:  Abed, Schar 10-May-1941, MRN [id number]  PCP:  Orpah Melter, MD  Cardiologist:  Dr Meda Coffee Primary Electrophysiologist: [name], MD    Chief Complaint  Patient presents with  . ICM     History of Present Illness: [name] is a 74 y.o. male who presents today for electrophysiology evaluation.   He presents today for EP consultation regarding risk stratification of sudden death. 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  He reports that he began having symptoms of SOB and DOE 12/15.  His symptoms progressed.  He underwent echo 11/11/14 which revealed EF 25% with moderate MR and moderate LA enlargement.  He had a myoview 2/16 that showed no prior scar and no ischemia but dilated left ventricle with moderately to severely impaired systolic function.  He was started on lisinopril 5 mg daily (was already on metoprolol).  Additional medical therapy has been limited by low blood pressure.  He has been enrolled in cardiac rehab with some improvement in symptoms.  He continues to have SOB with moderate activity.  He recently was able to walk 2 flights of stairs and was surprised with his endurance.  He could not have done that several months ago.   He has stable LE lymphedema, controlled by a low dose lasix.     Today, he denies symptoms of palpitations, chest pain,  orthopnea, PND,  claudication, dizziness, presyncope, syncope, bleeding, or neurologic sequela. The patient is tolerating medications without difficulties and is otherwise without complaint today.    Past Medical History  Diagnosis Date  . Obesity   . Hyperlipidemia   . LBBB (left bundle branch block)   . Prediabetes   . Hyperplastic colon polyp   . Degenerative disc disease,  thoracic   . Acid reflux   . Nonischemic cardiomyopathy   . CAD (coronary artery disease)   . Merkel cell cancer 2006    s/p gluteal resection, lymph node dissection, chemo/ radiation   Past Surgical History  Procedure Laterality Date  . Excision of merkel cell  2006  . Left knee surgery  1960  . Colonoscopy  2009  . Left and right cataract sugery       Current Outpatient Prescriptions  Medication Sig Dispense Refill  . aspirin 81 MG tablet Take 81 mg by mouth daily.    . fluorouracil (EFUDEX) 5 % cream Apply 5 application topically daily as needed (Patches on face).   0  . furosemide (LASIX) 20 MG tablet Take 20 mg by mouth daily.  0  . ibuprofen (ADVIL,MOTRIN) 200 MG tablet Take 400 mg by mouth every 6 (six) hours as needed (pain).    Marland Kitchen levothyroxine (SYNTHROID, LEVOTHROID) 50 MCG tablet Take 50 mcg by mouth daily.  5  . lisinopril (PRINIVIL,ZESTRIL) 5 MG tablet Take 5 mg by mouth daily.    . metoprolol tartrate (LOPRESSOR) 25 MG tablet Take 25 mg by mouth 2 (two) times daily.    Marland Kitchen omeprazole (PRILOSEC) 20 MG capsule Take 20 mg by mouth daily.    . simvastatin (ZOCOR) 10 MG tablet Take 1 tablet (10 mg total) by mouth daily.     No current facility-administered medications for this visit.   Facility-Administered Medications Ordered in Other Visits  Medication Dose Route  Frequency Provider Last Rate Last Dose  . adenosine (diagnostic) (ADENOSCAN) infusion 68.4 mg  0.56 mg/kg Intravenous Once Lelon Perla, MD   68.4 mg at 11/11/14 1310    Allergies:   Tape   Social History:  The patient  reports that he has quit smoking. He does not have any smokeless tobacco history on file. He reports that he drinks alcohol. He reports that he does not use illicit drugs.   Family History:  The patient's  family history includes Cancer in his father and mother; Lupus in his mother; Stroke in his sister; Thyroid disease in his sister.    ROS:  Please see the history of present illness.    All other systems are reviewed and negative.    PHYSICAL EXAM: VS:  BP 108/80 mmHg  Pulse 83  Ht 5\' 11"  (1.803 m)  Wt 116.393 kg (256 lb 9.6 oz)  BMI 35.80 kg/m2 , BMI Body mass index is 35.8 kg/(m^2). GEN: overweight, in no acute distress HEENT: normal Neck: no JVD, carotid bruits, or masses Cardiac: RRR; no murmurs, rubs, or gallops,R leg edema (chronic) Respiratory:  clear to auscultation bilaterally, normal work of breathing GI: soft, nontender, nondistended, + BS MS: no deformity or atrophy Skin: warm and dry  Neuro:  Strength and sensation are intact Psych: euthymic mood, full affect  EKG:  EKG is ordered today. The ekg ordered today shows sinus rhythm 83 bpm, PR 234, QRS 174 (LBBB), QTc 500   Recent Labs: 11/25/2014: ALT 22; BUN 18; Hemoglobin 13.9; Platelets 136.0*; Potassium 4.0; Pro B Natriuretic peptide (BNP) 403.0*; Sodium 140; TSH 5.19* 03/13/2015: Creatinine 1.09    Lipid Panel     Component Value Date/Time   CHOL 114 11/25/2014 0747   TRIG 66.0 11/25/2014 0747   HDL 42.20 11/25/2014 0747   CHOLHDL 3 11/25/2014 0747   VLDL 13.2 11/25/2014 0747   LDLCALC 59 11/25/2014 0747     Wt Readings from Last 3 Encounters:  03/16/15 116.393 kg (256 lb 9.6 oz)  02/26/15 117.028 kg (258 lb)  11/25/14 122.018 kg (269 lb)      Other studies Reviewed: Additional studies/ records that were reviewed today include: Dr Thera Flake notes, echo, recent MRI Review of the above records today demonstrates: EF 18%   ASSESSMENT AND PLAN:  1.  The patient has a nonischemic CM (EF 18%), NYHA Class II/III CHF, and CAD. At this time, he meets MADIT II/ SCD-HeFT criteria for ICD implantation for primary prevention of sudden death.  He has a LBBB with QRS >150 msec and therefore also meets criteria for CRT.  Risks, benefits, alternatives to BiV ICD implantation were discussed in detail with the patient today. The patient  understands that the risks include but are not limited to bleeding,  infection, pneumothorax, perforation, tamponade, vascular damage, renal failure, MI, stroke, death, inappropriate shocks, and lead dislodgement and wishes to proceed.  We will therefore schedule device implantation at the next available time.  2. Obesity Weight loss is advised  Current medicines are reviewed at length with the patient today.   The patient does not have concerns regarding his medicines.  The following changes were made today:  none  Labs/ tests ordered today include:  Orders Placed This Encounter  Procedures  . Basic metabolic panel  . CBC with Differential/Platelet  . EKG 12-Lead    Signed, [name], MD  03/16/2015 9:29 AM     [facility] [address] [place] [address] (  [phone] (office) [phone] (fax)

## 2015-03-17 ENCOUNTER — Ambulatory Visit (HOSPITAL_COMMUNITY): Payer: Medicare Other | Attending: Cardiology

## 2015-03-17 ENCOUNTER — Telehealth: Payer: Self-pay | Admitting: Cardiology

## 2015-03-17 DIAGNOSIS — I429 Cardiomyopathy, unspecified: Secondary | ICD-10-CM | POA: Insufficient documentation

## 2015-03-17 DIAGNOSIS — I5022 Chronic systolic (congestive) heart failure: Secondary | ICD-10-CM | POA: Insufficient documentation

## 2015-03-17 NOTE — Telephone Encounter (Signed)
Left message to call back.  Noted that the spouse is not on the pts DPR to release any medical information.

## 2015-03-17 NOTE — Telephone Encounter (Signed)
Informed the pts wife that per that pts chart and medical records, without a DPR on file stating that we can release the pts medical information to her, I am unable to provide her any medical information in regards to the pt.  Advised the wife to have the pt give a verbal consent to release information, and she states he is not around.  Advised the wife to have the pt sign a DPR form here in our office to release medical information to her.  Informed wife that we could have medical records even email a DPR form for the pt to sign and send back to our office. Pts wife not satisfied with suggestions.

## 2015-03-17 NOTE — Telephone Encounter (Signed)
New problem   Pt's wife need a call back to explain the call the pt just received about 10 mins ago.

## 2015-03-18 ENCOUNTER — Encounter (HOSPITAL_COMMUNITY)
Admission: RE | Admit: 2015-03-18 | Discharge: 2015-03-18 | Disposition: A | Discharge status: Home / Self Care | Payer: Medicare Other | Source: Ambulatory Visit | Attending: Cardiology | Admitting: Cardiology

## 2015-03-18 DIAGNOSIS — I509 Heart failure, unspecified: Secondary | ICD-10-CM | POA: Diagnosis not present

## 2015-03-20 ENCOUNTER — Other Ambulatory Visit (HOSPITAL_COMMUNITY): Payer: Self-pay | Admitting: *Deleted

## 2015-03-20 ENCOUNTER — Encounter (HOSPITAL_COMMUNITY)
Admission: RE | Admit: 2015-03-20 | Discharge: 2015-03-20 | Disposition: A | Discharge status: Home / Self Care | Payer: Medicare Other | Source: Ambulatory Visit | Attending: Cardiology | Admitting: Cardiology

## 2015-03-20 ENCOUNTER — Telehealth: Payer: Self-pay | Admitting: *Deleted

## 2015-03-20 DIAGNOSIS — I428 Other cardiomyopathies: Secondary | ICD-10-CM

## 2015-03-20 DIAGNOSIS — I509 Heart failure, unspecified: Secondary | ICD-10-CM | POA: Diagnosis not present

## 2015-03-20 DIAGNOSIS — R0602 Shortness of breath: Secondary | ICD-10-CM

## 2015-03-20 NOTE — Telephone Encounter (Signed)
[facility] called to request an order be placed for a CPX, for the pt is currently there to get this done, as scheduled for today. Placed the CPX order in the system and Cyril Mourning confirmed that she visualized the order.

## 2015-03-23 ENCOUNTER — Encounter (HOSPITAL_COMMUNITY): Payer: Medicare Other

## 2015-03-24 DIAGNOSIS — R0602 Shortness of breath: Secondary | ICD-10-CM | POA: Diagnosis not present

## 2015-03-25 ENCOUNTER — Encounter (HOSPITAL_COMMUNITY): Payer: Self-pay | Admitting: Cardiology

## 2015-03-25 ENCOUNTER — Encounter (HOSPITAL_COMMUNITY): Payer: Self-pay

## 2015-03-25 ENCOUNTER — Ambulatory Visit (HOSPITAL_COMMUNITY)
Admission: RE | Admit: 2015-03-25 | Discharge: 2015-03-25 | Disposition: A | Discharge status: Home / Self Care | Payer: Medicare Other | Source: Ambulatory Visit | Attending: Cardiology | Admitting: Cardiology

## 2015-03-25 ENCOUNTER — Encounter (HOSPITAL_COMMUNITY): Payer: Self-pay | Admitting: *Deleted

## 2015-03-25 ENCOUNTER — Encounter (HOSPITAL_COMMUNITY): Payer: Self-pay | Admitting: Internal Medicine

## 2015-03-25 ENCOUNTER — Encounter (HOSPITAL_COMMUNITY)
Admission: RE | Admit: 2015-03-25 | Discharge: 2015-03-25 | Disposition: A | Discharge status: Home / Self Care | Payer: Medicare Other | Source: Ambulatory Visit | Attending: Cardiology | Admitting: Cardiology

## 2015-03-25 VITALS — BP 94/62 | HR 95 | Resp 20 | Wt 254.2 lb

## 2015-03-25 DIAGNOSIS — I447 Left bundle-branch block, unspecified: Secondary | ICD-10-CM

## 2015-03-25 DIAGNOSIS — I519 Heart disease, unspecified: Secondary | ICD-10-CM | POA: Diagnosis not present

## 2015-03-25 DIAGNOSIS — E669 Obesity, unspecified: Secondary | ICD-10-CM | POA: Insufficient documentation

## 2015-03-25 DIAGNOSIS — I509 Heart failure, unspecified: Secondary | ICD-10-CM | POA: Diagnosis present

## 2015-03-25 DIAGNOSIS — E785 Hyperlipidemia, unspecified: Secondary | ICD-10-CM | POA: Insufficient documentation

## 2015-03-25 DIAGNOSIS — I5022 Chronic systolic (congestive) heart failure: Secondary | ICD-10-CM | POA: Insufficient documentation

## 2015-03-25 DIAGNOSIS — Z79899 Other long term (current) drug therapy: Secondary | ICD-10-CM | POA: Insufficient documentation

## 2015-03-25 DIAGNOSIS — Z7982 Long term (current) use of aspirin: Secondary | ICD-10-CM | POA: Diagnosis not present

## 2015-03-25 MED ORDER — CARVEDILOL 3.125 MG PO TABS: 3.1250 mg | ORAL_TABLET | Freq: Two times a day (BID) | ORAL | Status: DC

## 2015-03-25 MED ORDER — SPIRONOLACTONE 25 MG PO TABS
12.5000 mg | ORAL_TABLET | Freq: Every day | ORAL | Status: DC
Start: 2015-03-25 — End: 2015-07-13

## 2015-03-25 NOTE — Patient Instructions (Signed)
Stop Metoprolol  Start Carvedilol 3.125 mg Twice daily   Start Spironolactone 12.5 mg (1/2 tab) daily  Labs in 1 week  Heart Cath on Mon 6/13, see instruction sheet   Your physician recommends that you schedule a follow-up appointment in: 4 weeks

## 2015-03-25 NOTE — Progress Notes (Signed)
Pt graduated from cardiac rehab program today with completion of 36 exercise sessions in Phase II. Pt maintained good attendance and progressed nicely during his participation in rehab as evidenced by increased MET level.   Medication list reconciled. Repeat  PHQ score-  0.  Pt has made significant lifestyle changes and should be commended for his success. Pt feels he has achieved his goals during cardiac rehab.   Pt plans to continue exercise via walking with his wife right now. Mr [name] is interested in going to the gym at [employer] after his AICD is implanted.

## 2015-03-26 NOTE — Progress Notes (Signed)
Patient ID: [name], male   DOB: 10/29/1940, 74 y.o.   MRN: [id number] PCP: Dr. Olen Pel Primary Cardiologist: Dr. Meda Coffee  74 yo with history of chronic systolic CHF and LBBB presents for CHF clinic evaluation.  Late last fall, patient began to notice dyspnea walking up steps.  He had been known to have LBBB x around 10 years but had no other cardiac history.  He eventually had an echo showing decreased LV systolic function and a Cardiolite in 1/16 showing no ischemia but low EF.  He was started on cardiac meds, including Lasix.  He says that his weight is down 30 lbs since February.  Repeat echo in 4/16 showed EF 10-15%, and cardiac MRI showed EF 18% with a late gadolinium enhancement pattern in the inferior and inferolateral walls that may be suggestive of prior MI. CPX in 5/16 actually showed low normal functional capacity, surprising given how low his EF was.  Currently, he is feeling pretty good.  Dyspnea only with heavy exertion.  No dyspnea walking on flat ground or going up a flight of steps.  Works full time with no problems now.  No chest pain or palpitations.  No daytime sleepiness and only mild snoring.  He is doing cardiac rehab.  He has seen Dr Rayann Heman and is planned for CRT-D implantation.   Labs (2/16): BNP 403, LDL 59, TSH 5.4 (mildly elevated) Labs (5/16): K 4, creatinine 1.07  PMH; 1. LBBB: x 10+ years.  2. Chronic systolic CHF: Adenosine Cardiolite (1/16) with normal perfusion.  Echo (4/16) with EF 10-15%, moderate LV dilation, mild LVH, diffuse hypokinesis.  Cardiac MRI (5/16) with severe LV dilation, EF 18%, septal-lateral dyssynchrony, diffuse hypokinesis with akinesis of the basal to mid inferior and inferolateral walls; LGE pattern suggested possible prior inferior/inferolateral MI; normal RV size and systolic function, mild MR.  CPX (5/16) with peak VO2 16.8, VE/VCO2 slope 30.4, RER 1.13 => low normal functional capacity, only mild limitation from heart failure.   3.  Hyperlipidemia 4. Obesity 5. 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   ROS: All systems reviewed and negative except as per HPI.   Current Outpatient Prescriptions  Medication Sig Dispense Refill  . aspirin 81 MG tablet Take 81 mg by mouth daily.    . fluorouracil (EFUDEX) 5 % cream Apply 5 application topically daily as needed (Patches on face).   0  . furosemide (LASIX) 20 MG tablet Take 20 mg by mouth daily.  0  . ibuprofen (ADVIL,MOTRIN) 200 MG tablet Take 400 mg by mouth every 6 (six) hours as needed (pain).    Marland Kitchen levothyroxine (SYNTHROID, LEVOTHROID) 50 MCG tablet Take 50 mcg by mouth daily.  5  . lisinopril (PRINIVIL,ZESTRIL) 5 MG tablet Take 5 mg by mouth daily.    Marland Kitchen omeprazole (PRILOSEC) 20 MG capsule Take 20 mg by mouth daily.    . simvastatin (ZOCOR) 10 MG tablet Take 1 tablet (10 mg total) by mouth daily.    . carvedilol (COREG) 3.125 MG tablet Take 1 tablet (3.125 mg total) by mouth 2 (two) times daily. 60 tablet 3  . spironolactone (ALDACTONE) 25 MG tablet Take 0.5 tablets (12.5 mg total) by mouth daily. 15 tablet 3   No current facility-administered medications for this encounter.  Facility-Administered Medications Ordered in Other Encounters  Medication Dose Route Frequency Provider Last Rate Last Dose  . adenosine (diagnostic) (ADENOSCAN) infusion 68.4 mg  0.56 mg/kg Intravenous Once Lelon Perla, MD   68.4 mg at 11/11/14 1310   BP 94/62 mmHg  Pulse 95  Resp 20  Wt 254 lb 4 oz (115.327 kg)  SpO2 98% General: NAD Neck: No JVD, no thyromegaly or thyroid nodule.  Lungs: Clear to auscultation bilaterally with normal respiratory effort. CV: Nondisplaced PMI.  Heart regular S1/S2, no S3/S4, no murmur.  Trace ankle edema.  No carotid bruit.  Normal pedal pulses.  Abdomen:  Soft, nontender, no hepatosplenomegaly, no distention.  Skin: Intact without lesions or rashes.  Neurologic: Alert and oriented x 3.  Psych: Normal affect. Extremities: No clubbing or cyanosis.  HEENT: Normal.   Assessment/Plan: 1. Chronic systolic CHF: Etiology uncertain, ?nonischemic (possibly prior viral myocarditis, never heavy drinker or drug abuser, no family history of cardiomyopathy, also possible that he has a LBBB cardiomyopathy).  Cardiolite showed no ischemia but cardiac MRI showed a pattern of LGE in the inferior and inferolateral walls concerning for prior MI.  Most recent study was a cardiac MRI in 5/16 showed EF 18%.  CPX in 5/16 surprisingly showed low normal functional capacity.  Weight is down considerably since CHF treatment began.  NYHA class II, not volume overloaded on exam.   - Need to rule out ischemic cardiomyopathy.  The scarred areas on MRI were not enough myocardium to explain the cardiomyopathy fully.  It this is an ischemic cardiomyopathy, would expect additional high grade lesions compromising the LAD and LCx territories as well.  I will do LHC/RHC.  This will be planned for 6/13 based on patient's schedule.  We discussed risks/benefits and he agrees to proceed.  I will check with Dr Rayann Heman to see if we can move the CRT-D implantation to after the cath (make sure that there are no high grade lesions that ought to be addressed prior to device placement).  - If he does not have significant, treatable CAD, he will need CRT-D (LBBB).  - Stop metoprolol, start Coreg 3.125 mg bid.  - Continue lisinopril 5 mg daily  - Add spironolactone 12.5 mg daily with BMET next week.  - TSH high in 2/16, would repeat TSH with free T4 and T3. 2. Hyperlipidemia: Continue statin, good LDL in 2/16.  3. LBBB: Chronic.  As above, will likely eventually need CRT-D.   [name] 03/26/2015

## 2015-03-27 ENCOUNTER — Encounter (HOSPITAL_COMMUNITY): Payer: Medicare Other

## 2015-03-27 MED ORDER — LISINOPRIL 5 MG PO TABS: 2.5000 mg | ORAL_TABLET | Freq: Every day | ORAL | Status: DC

## 2015-03-27 NOTE — Progress Notes (Signed)
From: [name]      Sent: 03/25/2015  8:32 PM      To: [facility]    Subject: Non-Urgent Medical Question                 I was updating my medicines after my appointment on June 1 and noticed I gave you the wrong dosage for the Lisinopril. 5mg  is correct, but I only take 1/2 a tablet once a day. Please correct my information as it may affect the dosage for other medicines.    Med list updated with correct dose

## 2015-03-30 ENCOUNTER — Encounter (HOSPITAL_COMMUNITY): Payer: Medicare Other

## 2015-03-30 ENCOUNTER — Other Ambulatory Visit: Payer: Self-pay | Admitting: *Deleted

## 2015-04-01 ENCOUNTER — Other Ambulatory Visit (HOSPITAL_COMMUNITY): Payer: Medicare Other

## 2015-04-01 ENCOUNTER — Ambulatory Visit (HOSPITAL_COMMUNITY)
Admission: RE | Admit: 2015-04-01 | Discharge: 2015-04-01 | Disposition: A | Discharge status: Home / Self Care | Payer: Medicare Other | Source: Ambulatory Visit | Attending: Internal Medicine | Admitting: Internal Medicine

## 2015-04-01 ENCOUNTER — Encounter (HOSPITAL_COMMUNITY): Payer: Medicare Other

## 2015-04-01 DIAGNOSIS — I519 Heart disease, unspecified: Secondary | ICD-10-CM

## 2015-04-01 DIAGNOSIS — I5022 Chronic systolic (congestive) heart failure: Secondary | ICD-10-CM

## 2015-04-01 LAB — BASIC METABOLIC PANEL
Anion gap: 8 (ref 5–15)
BUN: 19 mg/dL (ref 6–20)
CO2: 29 mmol/L (ref 22–32)
Calcium: 9.1 mg/dL (ref 8.9–10.3)
Chloride: 103 mmol/L (ref 101–111)
Creatinine, Ser: 1.08 mg/dL (ref 0.61–1.24)
GFR calc Af Amer: >60 mL/min (ref >60)
GFR calc non Af Amer: >60 mL/min (ref >60)
Glucose, Bld: 105 mg/dL — ABNORMAL HIGH (ref 65–99)
Potassium: 4.7 mmol/L (ref 3.5–5.1)
Sodium: 140 mmol/L (ref 135–145)

## 2015-04-03 ENCOUNTER — Encounter (HOSPITAL_COMMUNITY): Payer: Medicare Other

## 2015-04-06 ENCOUNTER — Ambulatory Visit (HOSPITAL_COMMUNITY)
Admission: RE | Admit: 2015-04-06 | Discharge: 2015-04-07 | Disposition: A | Discharge status: Home / Self Care | Payer: Medicare Other | Source: Ambulatory Visit | Attending: Cardiology | Admitting: Cardiology

## 2015-04-06 ENCOUNTER — Encounter (HOSPITAL_COMMUNITY): Payer: Self-pay | Admitting: General Practice

## 2015-04-06 ENCOUNTER — Encounter (HOSPITAL_COMMUNITY)
Admission: RE | Disposition: A | Discharge status: Home / Self Care | Payer: Self-pay | Source: Ambulatory Visit | Attending: Cardiology

## 2015-04-06 ENCOUNTER — Encounter (HOSPITAL_COMMUNITY): Payer: Medicare Other

## 2015-04-06 DIAGNOSIS — M1990 Unspecified osteoarthritis, unspecified site: Secondary | ICD-10-CM | POA: Insufficient documentation

## 2015-04-06 DIAGNOSIS — Z955 Presence of coronary angioplasty implant and graft: Secondary | ICD-10-CM

## 2015-04-06 DIAGNOSIS — R0602 Shortness of breath: Secondary | ICD-10-CM | POA: Diagnosis present

## 2015-04-06 DIAGNOSIS — I429 Cardiomyopathy, unspecified: Secondary | ICD-10-CM | POA: Insufficient documentation

## 2015-04-06 DIAGNOSIS — Z79899 Other long term (current) drug therapy: Secondary | ICD-10-CM | POA: Insufficient documentation

## 2015-04-06 DIAGNOSIS — Z9221 Personal history of antineoplastic chemotherapy: Secondary | ICD-10-CM | POA: Diagnosis not present

## 2015-04-06 DIAGNOSIS — E669 Obesity, unspecified: Secondary | ICD-10-CM | POA: Insufficient documentation

## 2015-04-06 DIAGNOSIS — I447 Left bundle-branch block, unspecified: Secondary | ICD-10-CM | POA: Diagnosis not present

## 2015-04-06 DIAGNOSIS — I5022 Chronic systolic (congestive) heart failure: Secondary | ICD-10-CM | POA: Diagnosis present

## 2015-04-06 DIAGNOSIS — E785 Hyperlipidemia, unspecified: Secondary | ICD-10-CM | POA: Insufficient documentation

## 2015-04-06 DIAGNOSIS — Z791 Long term (current) use of non-steroidal anti-inflammatories (NSAID): Secondary | ICD-10-CM | POA: Diagnosis not present

## 2015-04-06 DIAGNOSIS — I2510 Atherosclerotic heart disease of native coronary artery without angina pectoris: Secondary | ICD-10-CM | POA: Insufficient documentation

## 2015-04-06 DIAGNOSIS — Z923 Personal history of irradiation: Secondary | ICD-10-CM | POA: Insufficient documentation

## 2015-04-06 DIAGNOSIS — Z7982 Long term (current) use of aspirin: Secondary | ICD-10-CM | POA: Insufficient documentation

## 2015-04-06 DIAGNOSIS — I428 Other cardiomyopathies: Secondary | ICD-10-CM

## 2015-04-06 DIAGNOSIS — I519 Heart disease, unspecified: Secondary | ICD-10-CM

## 2015-04-06 HISTORY — DX: Heart failure, unspecified: I50.9

## 2015-04-06 HISTORY — PX: CARDIAC CATHETERIZATION: SHX172

## 2015-04-06 LAB — POCT I-STAT 3, VENOUS BLOOD GAS (G3P V)
ACID-BASE DEFICIT: 1 mmol/L (ref 0.0–2.0)
Acid-base deficit: 1 mmol/L (ref 0.0–2.0)
Bicarbonate: 24.9 mEq/L — ABNORMAL HIGH (ref 20.0–24.0)
Bicarbonate: 25.4 mEq/L — ABNORMAL HIGH (ref 20.0–24.0)
O2 SAT: 58 %
O2 Saturation: 62 %
PCO2 VEN: 46 mmHg (ref 45.0–50.0)
PH VEN: 7.35 — AB (ref 7.250–7.300)
PO2 VEN: 34 mmHg (ref 30.0–45.0)
TCO2: 26 mmol/L (ref 0–100)
TCO2: 27 mmol/L (ref 0–100)
pCO2, Ven: 45.6 mmHg (ref 45.0–50.0)
pH, Ven: 7.345 — ABNORMAL HIGH (ref 7.250–7.300)
pO2, Ven: 32 mmHg (ref 30.0–45.0)

## 2015-04-06 LAB — POCT ACTIVATED CLOTTING TIME
ACTIVATED CLOTTING TIME: 159 s
ACTIVATED CLOTTING TIME: 478 s

## 2015-04-06 LAB — CBC
HEMATOCRIT: 39.5 % (ref 39.0–52.0)
HEMOGLOBIN: 13.5 g/dL (ref 13.0–17.0)
MCH: 33.4 pg (ref 26.0–34.0)
MCHC: 34.2 g/dL (ref 30.0–36.0)
MCV: 97.8 fL (ref 78.0–100.0)
Platelets: 145 10*3/uL — ABNORMAL LOW (ref 150–400)
RBC: 4.04 MIL/uL — ABNORMAL LOW (ref 4.22–5.81)
RDW: 12.5 % (ref 11.5–15.5)
WBC: 3.9 10*3/uL — ABNORMAL LOW (ref 4.0–10.5)

## 2015-04-06 LAB — PROTIME-INR
INR: 1.06 (ref 0.00–1.49)
PROTHROMBIN TIME: 14 s (ref 11.6–15.2)

## 2015-04-06 SURGERY — RIGHT/LEFT HEART CATH AND CORONARY ANGIOGRAPHY
Anesthesia: LOCAL

## 2015-04-06 MED ORDER — MIDAZOLAM HCL 2 MG/2ML IJ SOLN
INTRAMUSCULAR | Status: DC | PRN
  Administered 2015-04-06 (×2): 1 mg via INTRAVENOUS

## 2015-04-06 MED ORDER — HEPARIN SODIUM (PORCINE) 1000 UNIT/ML IJ SOLN
INTRAMUSCULAR | Status: DC | PRN
  Administered 2015-04-06: 5000 [IU] via INTRAVENOUS

## 2015-04-06 MED ORDER — IOHEXOL 350 MG/ML SOLN
INTRAVENOUS | Status: DC | PRN
  Administered 2015-04-06: 130 mL via INTRA_ARTERIAL

## 2015-04-06 MED ORDER — PANTOPRAZOLE SODIUM 40 MG PO PACK
40.0000 mg | PACK | Freq: Every day | ORAL | Status: DC
  Filled 2015-04-06: qty 20

## 2015-04-06 MED ORDER — VERAPAMIL HCL 2.5 MG/ML IV SOLN
INTRAVENOUS | Status: DC | PRN
  Administered 2015-04-06: 11:00:00 via INTRA_ARTERIAL

## 2015-04-06 MED ORDER — SODIUM CHLORIDE 0.9 % WEIGHT BASED INFUSION: 1.0000 mL/kg/h | INTRAVENOUS | Status: AC

## 2015-04-06 MED ORDER — ACETAMINOPHEN 325 MG PO TABS: 650.0000 mg | ORAL_TABLET | ORAL | Status: DC | PRN

## 2015-04-06 MED ORDER — SODIUM CHLORIDE 0.9 % IV SOLN
INTRAVENOUS | Status: DC
Start: 2015-04-07 — End: 2015-04-06

## 2015-04-06 MED ORDER — NITROGLYCERIN 1 MG/10 ML FOR IR/CATH LAB
INTRA_ARTERIAL | Status: AC
  Filled 2015-04-06: qty 10

## 2015-04-06 MED ORDER — SODIUM CHLORIDE 0.9 % IV SOLN: 250.0000 mL | INTRAVENOUS | Status: DC | PRN

## 2015-04-06 MED ORDER — LIDOCAINE HCL (PF) 1 % IJ SOLN
INTRAMUSCULAR | Status: AC
  Filled 2015-04-06: qty 30

## 2015-04-06 MED ORDER — POTASSIUM 75 MG PO TABS: 550.0000 mg | ORAL_TABLET | Freq: Every day | ORAL | Status: DC

## 2015-04-06 MED ORDER — CARVEDILOL 3.125 MG PO TABS
3.1250 mg | ORAL_TABLET | Freq: Two times a day (BID) | ORAL | Status: DC
  Administered 2015-04-06 – 2015-04-07 (×2): 3.125 mg via ORAL
  Filled 2015-04-06 (×2): qty 1

## 2015-04-06 MED ORDER — SPIRONOLACTONE 12.5 MG HALF TABLET
12.5000 mg | ORAL_TABLET | Freq: Every day | ORAL | Status: DC
  Administered 2015-04-07: 11:00:00 12.5 mg via ORAL
  Filled 2015-04-06: qty 1

## 2015-04-06 MED ORDER — CLOPIDOGREL BISULFATE 75 MG PO TABS
75.0000 mg | ORAL_TABLET | Freq: Every day | ORAL | Status: DC
  Administered 2015-04-07: 75 mg via ORAL
  Filled 2015-04-06: qty 1

## 2015-04-06 MED ORDER — MIDAZOLAM HCL 2 MG/2ML IJ SOLN
INTRAMUSCULAR | Status: AC
  Filled 2015-04-06: qty 2

## 2015-04-06 MED ORDER — LISINOPRIL 5 MG PO TABS: 2.5000 mg | ORAL_TABLET | Freq: Every day | ORAL | Status: DC

## 2015-04-06 MED ORDER — VERAPAMIL HCL 2.5 MG/ML IV SOLN
INTRAVENOUS | Status: AC
  Filled 2015-04-06: qty 2

## 2015-04-06 MED ORDER — PANTOPRAZOLE SODIUM 40 MG PO TBEC
40.0000 mg | DELAYED_RELEASE_TABLET | Freq: Every day | ORAL | Status: DC
  Administered 2015-04-06 – 2015-04-07 (×2): 40 mg via ORAL
  Filled 2015-04-06 (×2): qty 1

## 2015-04-06 MED ORDER — FENTANYL CITRATE (PF) 100 MCG/2ML IJ SOLN
INTRAMUSCULAR | Status: AC
  Filled 2015-04-06: qty 2

## 2015-04-06 MED ORDER — HEPARIN SODIUM (PORCINE) 1000 UNIT/ML IJ SOLN
INTRAMUSCULAR | Status: AC
  Filled 2015-04-06: qty 1

## 2015-04-06 MED ORDER — SODIUM CHLORIDE 0.9 % IJ SOLN: 3.0000 mL | Freq: Two times a day (BID) | INTRAMUSCULAR | Status: DC

## 2015-04-06 MED ORDER — HEPARIN (PORCINE) IN NACL 2-0.9 UNIT/ML-% IJ SOLN
INTRAMUSCULAR | Status: AC
  Filled 2015-04-06: qty 1500

## 2015-04-06 MED ORDER — FUROSEMIDE 20 MG PO TABS
20.0000 mg | ORAL_TABLET | Freq: Every day | ORAL | Status: DC
  Administered 2015-04-07: 11:00:00 20 mg via ORAL
  Filled 2015-04-06: qty 1

## 2015-04-06 MED ORDER — FENTANYL CITRATE (PF) 100 MCG/2ML IJ SOLN
INTRAMUSCULAR | Status: DC | PRN
  Administered 2015-04-06 (×2): 25 ug via INTRAVENOUS

## 2015-04-06 MED ORDER — ONDANSETRON HCL 4 MG/2ML IJ SOLN: 4.0000 mg | Freq: Four times a day (QID) | INTRAMUSCULAR | Status: DC | PRN

## 2015-04-06 MED ORDER — SODIUM CHLORIDE 0.9 % IJ SOLN: 3.0000 mL | INTRAMUSCULAR | Status: DC | PRN

## 2015-04-06 MED ORDER — CLOPIDOGREL BISULFATE 300 MG PO TABS
ORAL_TABLET | ORAL | Status: DC | PRN
  Administered 2015-04-06: 600 mg via ORAL

## 2015-04-06 MED ORDER — ASPIRIN 81 MG PO CHEW
81.0000 mg | CHEWABLE_TABLET | Freq: Every day | ORAL | Status: DC
  Administered 2015-04-06 – 2015-04-07 (×2): 81 mg via ORAL
  Filled 2015-04-06 (×2): qty 1

## 2015-04-06 MED ORDER — CLOPIDOGREL BISULFATE 300 MG PO TABS
ORAL_TABLET | ORAL | Status: AC
  Filled 2015-04-06: qty 2

## 2015-04-06 MED ORDER — ATORVASTATIN CALCIUM 80 MG PO TABS
80.0000 mg | ORAL_TABLET | Freq: Every day | ORAL | Status: DC
  Administered 2015-04-06: 80 mg via ORAL
  Filled 2015-04-06: qty 1

## 2015-04-06 MED ORDER — BIVALIRUDIN 250 MG IV SOLR
INTRAVENOUS | Status: AC
  Filled 2015-04-06: qty 250

## 2015-04-06 MED ORDER — BIVALIRUDIN 250 MG IV SOLR
250.0000 mg | INTRAVENOUS | Status: DC | PRN
  Administered 2015-04-06: 1.75 mg/kg/h via INTRAVENOUS

## 2015-04-06 MED ORDER — BIVALIRUDIN BOLUS VIA INFUSION - CUPID
INTRAVENOUS | Status: DC | PRN
  Administered 2015-04-06: 83.325 mg via INTRAVENOUS

## 2015-04-06 SURGICAL SUPPLY — 28 items
BALLN EMERGE MR 2.5X12 (BALLOONS) ×2
BALLN ~~LOC~~ EMERGE MR 3.5X12 (BALLOONS) ×2
BALLOON EMERGE MR 2.5X12 (BALLOONS) ×1 IMPLANT
BALLOON ~~LOC~~ EMERGE MR 3.5X12 (BALLOONS) ×1 IMPLANT
CATH BALLN WEDGE 5F 110CM (CATHETERS) ×2 IMPLANT
CATH INFINITI 5 FR JL3.5 (CATHETERS) ×2 IMPLANT
CATH INFINITI 5 FR MPA2 (CATHETERS) IMPLANT
CATH INFINITI 5FR ANG PIGTAIL (CATHETERS) ×2 IMPLANT
CATH INFINITI JR4 5F (CATHETERS) ×2 IMPLANT
CATH SWAN GANZ 7F STRAIGHT (CATHETERS) IMPLANT
CATH VISTA GUIDE 6FR XBLAD3.5 (CATHETERS) ×2 IMPLANT
DEVICE RAD COMP TR BAND LRG (VASCULAR PRODUCTS) ×2 IMPLANT
GLIDESHEATH SLEND SS 6F .021 (SHEATH) ×2 IMPLANT
KIT ENCORE 26 ADVANTAGE (KITS) ×2 IMPLANT
KIT HEART LEFT (KITS) ×2 IMPLANT
KIT HEART RIGHT NAMIC (KITS) IMPLANT
PACK CARDIAC CATHETERIZATION (CUSTOM PROCEDURE TRAY) ×2 IMPLANT
SHEATH FAST CATH BRACH 5F 5CM (SHEATH) ×2 IMPLANT
SHEATH PINNACLE 5F 10CM (SHEATH) IMPLANT
SHEATH PINNACLE 7F 10CM (SHEATH) IMPLANT
STENT PROMUS PREM MR 3.5X16 (Permanent Stent) ×2 IMPLANT
SYR MEDRAD MARK V 150ML (SYRINGE) ×2 IMPLANT
TRANSDUCER W/STOPCOCK (MISCELLANEOUS) ×2 IMPLANT
TUBING CIL FLEX 10 FLL-RA (TUBING) ×2 IMPLANT
WIRE ASAHI PROWATER 180CM (WIRE) ×2 IMPLANT
WIRE EMERALD 3MM-J .035X150CM (WIRE) IMPLANT
WIRE HI TORQ VERSACORE-J 145CM (WIRE) IMPLANT
WIRE SAFE-T 1.5MM-J .035X260CM (WIRE) ×2 IMPLANT

## 2015-04-06 NOTE — CV Procedure (Signed)
    Cardiac Catheterization Procedure Note  Name: [name] MRN: [id number] DOB: [birth year]  Procedure: Right Heart Cath, Left Heart Cath, Selective Coronary Angiography, LV angiography  Indication: Cardiomyopathy of uncertain etiology.    Procedural Details: The radial and brachial areas were prepped, draped, and anesthetized with 1% lidocaine. There was a pre-existing IV in the right brachial area that was replaced with a 45F venous sheath. A Swan-Ganz catheter was used for the right heart catheterization. Standard protocol was followed for recording of right heart pressures and sampling of oxygen saturations. Fick cardiac output was calculated. The right radial artery was entered using modified Seldinger technique and a 6 F sheath was placed.  The patient received 3 mg IA verapamil and weight-based IV heparin.  Standard Judkins catheters were used for selective coronary angiography and left ventriculography. There were no immediate procedural complications.   Procedural Findings: Hemodynamics (mmHg) RA mean 6 RV 28/5 PA 25/12, mean 19 PCWP mean 6 LV 105/11 AO 96/67  Oxygen saturations: PA 60% AO 95%  Cardiac Output (Fick) 4.76  Cardiac Index (Fick) 2.07   Coronary angiography: Coronary dominance: right  Left mainstem: No significant disease.   Left anterior descending (LAD): Luminal irregularities.  Left circumflex (LCx): There was a large PLOM.  There is 80% stenosis in the mid LCx prior to PLOM.  There is 95% stenosis in the mid LCx just post-PLOM.    Right coronary artery (RCA): Luminal irregularities.   Left ventriculography: EF 30-35% with basal to mid inferior akinesis.   Final Conclusions:  EF is improved from prior.  Filling pressures normal now.  Cardiac output low but not markedly low.  There is a tight stenosis in the mid LCx affecting a large PLOM (considerable territory).  Discussed with Dr Martinique, plan PCI today and will then wait 3 months to repeat  echo prior to CRT-D.    [name] 04/06/2015, 11:04 AM

## 2015-04-06 NOTE — Interval H&P Note (Signed)
Cath Lab Visit (complete for each Cath Lab visit)  Clinical Evaluation Leading to the Procedure:   ACS: No.  Non-ACS:    Anginal Classification: CCS III  Anti-ischemic medical therapy: Minimal Therapy (1 class of medications)  Non-Invasive Test Results: No non-invasive testing performed  Prior CABG: No previous CABG      History and Physical Interval Note:  04/06/2015 10:13 AM  [name]  has presented today for surgery, with the diagnosis of chf  The various methods of treatment have been discussed with the patient and family. After consideration of risks, benefits and other options for treatment, the patient has consented to  Procedure(s): Right/Left Heart Cath and Coronary Angiography (N/A) as a surgical intervention .  The patient's history has been reviewed, patient examined, no change in status, stable for surgery.  I have reviewed the patient's chart and labs.  Questions were answered to the patient's satisfaction.     Willis Holquin [employer]

## 2015-04-06 NOTE — Progress Notes (Signed)
Tele shows frequent MF PVCs.

## 2015-04-06 NOTE — H&P (View-Only) (Signed)
Patient ID: [name], male   DOB: 1941-07-04, 74 y.o.   MRN: [id number] PCP: Dr. Olen Pel Primary Cardiologist: Dr. Meda Coffee  74 yo with history of chronic systolic CHF and LBBB presents for CHF clinic evaluation.  Late last fall, patient began to notice dyspnea walking up steps.  He had been known to have LBBB x around 10 years but had no other cardiac history.  He eventually had an echo showing decreased LV systolic function and a Cardiolite in 1/16 showing no ischemia but low EF.  He was started on cardiac meds, including Lasix.  He says that his weight is down 30 lbs since February.  Repeat echo in 4/16 showed EF 10-15%, and cardiac MRI showed EF 18% with a late gadolinium enhancement pattern in the inferior and inferolateral walls that may be suggestive of prior MI. CPX in 5/16 actually showed low normal functional capacity, surprising given how low his EF was.  Currently, he is feeling pretty good.  Dyspnea only with heavy exertion.  No dyspnea walking on flat ground or going up a flight of steps.  Works full time with no problems now.  No chest pain or palpitations.  No daytime sleepiness and only mild snoring.  He is doing cardiac rehab.  He has seen Dr Rayann Heman and is planned for CRT-D implantation.   Labs (2/16): BNP 403, LDL 59, TSH 5.4 (mildly elevated) Labs (5/16): K 4, creatinine 1.07  PMH; 1. LBBB: x 10+ years.  2. Chronic systolic CHF: Adenosine Cardiolite (1/16) with normal perfusion.  Echo (4/16) with EF 10-15%, moderate LV dilation, mild LVH, diffuse hypokinesis.  Cardiac MRI (5/16) with severe LV dilation, EF 18%, septal-lateral dyssynchrony, diffuse hypokinesis with akinesis of the basal to mid inferior and inferolateral walls; LGE pattern suggested possible prior inferior/inferolateral MI; normal RV size and systolic function, mild MR.  CPX (5/16) with peak VO2 16.8, VE/VCO2 slope 30.4, RER 1.13 => low normal functional capacity, only mild limitation from heart failure.   3.  Hyperlipidemia 4. Obesity 5. 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   ROS: All systems reviewed and negative except as per HPI.   Current Outpatient Prescriptions  Medication Sig Dispense Refill  . aspirin 81 MG tablet Take 81 mg by mouth daily.    . fluorouracil (EFUDEX) 5 % cream Apply 5 application topically daily as needed (Patches on face).   0  . furosemide (LASIX) 20 MG tablet Take 20 mg by mouth daily.  0  . ibuprofen (ADVIL,MOTRIN) 200 MG tablet Take 400 mg by mouth every 6 (six) hours as needed (pain).    Marland Kitchen levothyroxine (SYNTHROID, LEVOTHROID) 50 MCG tablet Take 50 mcg by mouth daily.  5  . lisinopril (PRINIVIL,ZESTRIL) 5 MG tablet Take 5 mg by mouth daily.    Marland Kitchen omeprazole (PRILOSEC) 20 MG capsule Take 20 mg by mouth daily.    . simvastatin (ZOCOR) 10 MG tablet Take 1 tablet (10 mg total) by mouth daily.    . carvedilol (COREG) 3.125 MG tablet Take 1 tablet (3.125 mg total) by mouth 2 (two) times daily. 60 tablet 3  . spironolactone (ALDACTONE) 25 MG tablet Take 0.5 tablets (12.5 mg total) by mouth daily. 15 tablet 3   No current facility-administered medications for this encounter.  Facility-Administered Medications Ordered in Other Encounters  Medication Dose Route Frequency Provider Last Rate Last Dose  . adenosine (diagnostic) (ADENOSCAN) infusion 68.4 mg  0.56 mg/kg Intravenous Once Lelon Perla, MD   68.4 mg at 11/11/14 1310   BP 94/62 mmHg  Pulse 95  Resp 20  Wt 254 lb 4 oz (115.327 kg)  SpO2 98% General: NAD Neck: No JVD, no thyromegaly or thyroid nodule.  Lungs: Clear to auscultation bilaterally with normal respiratory effort. CV: Nondisplaced PMI.  Heart regular S1/S2, no S3/S4, no murmur.  Trace ankle edema.  No carotid bruit.  Normal pedal pulses.  Abdomen:  Soft, nontender, no hepatosplenomegaly, no distention.  Skin: Intact without lesions or rashes.  Neurologic: Alert and oriented x 3.  Psych: Normal affect. Extremities: No clubbing or cyanosis.  HEENT: Normal.   Assessment/Plan: 1. Chronic systolic CHF: Etiology uncertain, ?nonischemic (possibly prior viral myocarditis, never heavy drinker or drug abuser, no family history of cardiomyopathy, also possible that he has a LBBB cardiomyopathy).  Cardiolite showed no ischemia but cardiac MRI showed a pattern of LGE in the inferior and inferolateral walls concerning for prior MI.  Most recent study was a cardiac MRI in 5/16 showed EF 18%.  CPX in 5/16 surprisingly showed low normal functional capacity.  Weight is down considerably since CHF treatment began.  NYHA class II, not volume overloaded on exam.   - Need to rule out ischemic cardiomyopathy.  The scarred areas on MRI were not enough myocardium to explain the cardiomyopathy fully.  It this is an ischemic cardiomyopathy, would expect additional high grade lesions compromising the LAD and LCx territories as well.  I will do LHC/RHC.  This will be planned for 6/13 based on patient's schedule.  We discussed risks/benefits and he agrees to proceed.  I will check with Dr Rayann Heman to see if we can move the CRT-D implantation to after the cath (make sure that there are no high grade lesions that ought to be addressed prior to device placement).  - If he does not have significant, treatable CAD, he will need CRT-D (LBBB).  - Stop metoprolol, start Coreg 3.125 mg bid.  - Continue lisinopril 5 mg daily  - Add spironolactone 12.5 mg daily with BMET next week.  - TSH high in 2/16, would repeat TSH with free T4 and T3. 2. Hyperlipidemia: Continue statin, good LDL in 2/16.  3. LBBB: Chronic.  As above, will likely eventually need CRT-D.   [name] 03/26/2015

## 2015-04-07 ENCOUNTER — Telehealth (HOSPITAL_COMMUNITY): Payer: Self-pay | Admitting: Cardiac Rehabilitation

## 2015-04-07 ENCOUNTER — Encounter (HOSPITAL_COMMUNITY): Payer: Self-pay | Admitting: Cardiology

## 2015-04-07 DIAGNOSIS — R0602 Shortness of breath: Secondary | ICD-10-CM | POA: Diagnosis not present

## 2015-04-07 DIAGNOSIS — I5022 Chronic systolic (congestive) heart failure: Secondary | ICD-10-CM | POA: Diagnosis not present

## 2015-04-07 DIAGNOSIS — I447 Left bundle-branch block, unspecified: Secondary | ICD-10-CM | POA: Diagnosis not present

## 2015-04-07 DIAGNOSIS — I25709 Atherosclerosis of coronary artery bypass graft(s), unspecified, with unspecified angina pectoris: Secondary | ICD-10-CM | POA: Diagnosis not present

## 2015-04-07 DIAGNOSIS — I429 Cardiomyopathy, unspecified: Secondary | ICD-10-CM | POA: Diagnosis not present

## 2015-04-07 DIAGNOSIS — I2510 Atherosclerotic heart disease of native coronary artery without angina pectoris: Secondary | ICD-10-CM | POA: Diagnosis not present

## 2015-04-07 LAB — CBC
HCT: 38.1 % — ABNORMAL LOW (ref 39.0–52.0)
Hemoglobin: 12.8 g/dL — ABNORMAL LOW (ref 13.0–17.0)
MCH: 33.1 pg (ref 26.0–34.0)
MCHC: 33.6 g/dL (ref 30.0–36.0)
MCV: 98.4 fL (ref 78.0–100.0)
PLATELETS: 141 10*3/uL — AB (ref 150–400)
RBC: 3.87 MIL/uL — ABNORMAL LOW (ref 4.22–5.81)
RDW: 12.8 % (ref 11.5–15.5)
WBC: 5.4 10*3/uL (ref 4.0–10.5)

## 2015-04-07 LAB — BASIC METABOLIC PANEL
ANION GAP: 7 (ref 5–15)
BUN: 18 mg/dL (ref 6–20)
CALCIUM: 9.1 mg/dL (ref 8.9–10.3)
CO2: 28 mmol/L (ref 22–32)
Chloride: 105 mmol/L (ref 101–111)
Creatinine, Ser: 1.15 mg/dL (ref 0.61–1.24)
GFR calc Af Amer: >60 mL/min (ref >60)
GLUCOSE: 110 mg/dL — AB (ref 65–99)
POTASSIUM: 4.2 mmol/L (ref 3.5–5.1)
SODIUM: 140 mmol/L (ref 135–145)

## 2015-04-07 MED ORDER — DIGOXIN 125 MCG PO TABS: 0.1250 mg | ORAL_TABLET | Freq: Every day | ORAL | Status: DC

## 2015-04-07 MED ORDER — ATORVASTATIN CALCIUM 80 MG PO TABS: 80.0000 mg | ORAL_TABLET | Freq: Every day | ORAL | Status: DC

## 2015-04-07 MED ORDER — NITROGLYCERIN 0.4 MG SL SUBL: 0.4000 mg | SUBLINGUAL_TABLET | SUBLINGUAL | Status: DC | PRN

## 2015-04-07 MED ORDER — CLOPIDOGREL BISULFATE 75 MG PO TABS: 75.0000 mg | ORAL_TABLET | Freq: Every day | ORAL | Status: DC

## 2015-04-07 MED ORDER — DIGOXIN 125 MCG PO TABS
0.1250 mg | ORAL_TABLET | Freq: Every day | ORAL | Status: DC
  Filled 2015-04-07 (×2): qty 1

## 2015-04-07 MED ORDER — LISINOPRIL 5 MG PO TABS: 5.0000 mg | ORAL_TABLET | Freq: Every day | ORAL | Status: DC

## 2015-04-07 MED ORDER — LISINOPRIL 5 MG PO TABS
5.0000 mg | ORAL_TABLET | Freq: Every day | ORAL | Status: DC
  Administered 2015-04-07: 11:00:00 5 mg via ORAL
  Filled 2015-04-07: qty 1

## 2015-04-07 MED FILL — Lidocaine HCl Local Preservative Free (PF) Inj 1%: INTRAMUSCULAR | Qty: 30 | Status: AC

## 2015-04-07 MED FILL — Heparin Sodium (Porcine) 2 Unit/ML in Sodium Chloride 0.9%: INTRAMUSCULAR | Qty: 1500 | Status: AC

## 2015-04-07 NOTE — Progress Notes (Signed)
CARDIAC REHAB PHASE I   PRE:  Rate/Rhythm: 99 SR with LBBB and PVCs    BP: sitting 109/91 dinamapp    SaO2:   MODE:  Ambulation: 800 ft   POST:  Rate/Rhythm: 109 ST LBBB    BP: sitting 128/107 dinamapp, 140/80 manual     SaO2:   Pt tolerated fairly well. C/o hip pain. Denied SOB but I noted he was more SOB with talking and walking up incline. He agrees. Sts he feels normal, no different than before PCI. Ed completed and pt eager to do CRPII again.  Also discussed walking at home and NTG and Plavix. Discussed maintaining low sodium diet. Will send referral to [place]. [phone]   [name] [place] CES, ACSM 04/07/2015 9:02 AM

## 2015-04-07 NOTE — Telephone Encounter (Signed)
Phone message received from pt expressing interest in cardiac rehab.  Left message on answering machine

## 2015-04-07 NOTE — Discharge Summary (Signed)
Physician Discharge Summary  Patient ID: [name] MRN: [id number] DOB/AGE: 74-Dec-1942 74 y.o.   Primary Cardiologist: Dr. Meda Coffee Electrophysiologist: Dr. Rayann Heman  Admit date: 04/06/2015 Discharge date: 04/07/2015  Admission Diagnoses: Chronic Systolic Dysfunction of Left Ventricle   Discharge Diagnoses:  Principal Problem:   Chronic systolic dysfunction of left ventricle Active Problems:   Nonischemic cardiomyopathy   LBBB (left bundle branch block)   SOB (shortness of breath)   Chronic systolic CHF (congestive heart failure)   Discharged Condition: stable  Hospital Course: 74 y/o male with history of chronic systolic CHF (EF of 23-76% on echo 01/2015) and chronic LBBB who presented to [facility] on 04/06/15 to undergo planned L/RHC to rule out ischemic cardiomyopathy. The diagnostic portion of the procedure was performed by Dr. Aundra Dubin.  This revealed a tight stenosis in the mid LCx affecting a large PLOM (considerable territory). He then underwent successful PCI + DES to the proximal LCX, performed by Dr. Martinique. EF by cath was actually improved to 30-35%. He was placed on DAPT with ASA + Plavix. He had no post cath complications. He was continued on Coreg, lisinopril, Lipitor and spironolactone. His lisinopril was increased to 5 mg daily and Lipitor increased to 80 mg daily. Coreg was continued at 3.125 mg BID and spironolactone at 12.5 mg daily. Digoxin, 0.125 mg, was also added given his decreased LV function. The plan is to reassess LVF  3 months after PCI and medical therapy to see if an ICD is warranted. The patient was last seen and examined by Dr. Aundra Dubin, who determined he was stable for discharge home. A referral for OP cardiac rehab was placed. 2 week f/u in the CHF clinic was also arranged.    Consults: None  Significant Diagnostic Studies: [facility] 04/06/15  Coronary Findings    Dominance: Right   Left Main  No significant disease.     Left Anterior Descending  Luminal  irregularities.     Left Circumflex  There was a large PLOM. There is 80% stenosis in the mid LCx prior to PLOM. There is 95% stenosis in the mid LCx just post-PLOM.      Right Coronary Artery  Luminal irregularities       Right Heart Pressures RHC Hemodynamics (mmHg) RA mean 6 RV 28/5 PA 25/12, mean 19 PCWP mean 6 LV 105/11 AO 96/67  Oxygen saturations: PA 60% AO 95%  Cardiac Output (Fick) 4.76  Cardiac Index (Fick) 2.07    Wall Motion    EF 30-35% with basal to mid inferior akinesis.        Treatments: See Hospital Course  Discharge Exam: Blood pressure 90/63, pulse 96, temperature 98 F (36.7 C), temperature source Oral, resp. rate 20, height 5\' 11"  (1.803 m), weight 246 lb 14.6 oz (112 kg), SpO2 96 %.   Disposition: Final discharge disposition not confirmed      Discharge Instructions    Amb Referral to Cardiac Rehabilitation    Complete by:  As directed   Congestive Heart Failure: If diagnosis is Heart Failure, patient MUST meet each of the CMS criteria: 1. Left Ventricular Ejection Fraction </= 35% 2. NYHA class II-IV symptoms despite being on optimal heart failure therapy for at least 6 weeks. 3. Stable = have not had a recent (<6 weeks) or planned (<6 months) major cardiovascular hospitalization or procedure  Program Details: - Physician supervised classes - 1-3 classes per week over a 12-18 week period, generally for a total of 36 sessions  Physician Certification:  I certify that the above Cardiac Rehabilitation treatment is medically necessary and is medically approved by me for treatment of this patient. The patient is willing and cooperative, able to ambulate and medically stable to participate in exercise rehabilitation. The participant's progress and Individualized Treatment Plan will be reviewed by the Medical Director, Cardiac Rehab staff and as indicated by the Referring/Ordering Physician.  Diagnosis:  Heart Failure (see  criteria below)  Heart Failure Type:  Chronic Systolic     Amb Referral to Cardiac Rehabilitation    Complete by:  As directed   Congestive Heart Failure: If diagnosis is Heart Failure, patient MUST meet each of the CMS criteria: 1. Left Ventricular Ejection Fraction </= 35% 2. NYHA class II-IV symptoms despite being on optimal heart failure therapy for at least 6 weeks. 3. Stable = have not had a recent (<6 weeks) or planned (<6 months) major cardiovascular hospitalization or procedure  Program Details: - Physician supervised classes - 1-3 classes per week over a 12-18 week period, generally for a total of 36 sessions  Physician Certification: I certify that the above Cardiac Rehabilitation treatment is medically necessary and is medically approved by me for treatment of this patient. The patient is willing and cooperative, able to ambulate and medically stable to participate in exercise rehabilitation. The participant's progress and Individualized Treatment Plan will be reviewed by the Medical Director, Cardiac Rehab staff and as indicated by the Referring/Ordering Physician.  Diagnosis:  PCI            Medication List    STOP taking these medications        ibuprofen 200 MG tablet  Commonly known as:  ADVIL,MOTRIN      TAKE these medications        aspirin 81 MG tablet  Take 81 mg by mouth daily.     atorvastatin 80 MG tablet  Commonly known as:  LIPITOR  Take 1 tablet (80 mg total) by mouth daily at 6 PM.     carvedilol 3.125 MG tablet  Commonly known as:  COREG  Take 1 tablet (3.125 mg total) by mouth 2 (two) times daily.     clopidogrel 75 MG tablet  Commonly known as:  PLAVIX  Take 1 tablet (75 mg total) by mouth daily with breakfast.     digoxin 0.125 MG tablet  Commonly known as:  LANOXIN  Take 1 tablet (0.125 mg total) by mouth daily.     fluorouracil 5 % cream  Commonly known as:  EFUDEX  Apply 5 application topically daily as needed (Patches on face).       furosemide 20 MG tablet  Commonly known as:  LASIX  Take 20 mg by mouth daily.     levothyroxine 50 MCG tablet  Commonly known as:  SYNTHROID, LEVOTHROID  Take 50 mcg by mouth daily.     lisinopril 5 MG tablet  Commonly known as:  PRINIVIL,ZESTRIL  Take 1 tablet (5 mg total) by mouth daily.     nitroGLYCERIN 0.4 MG SL tablet  Commonly known as:  NITROSTAT  Place 1 tablet (0.4 mg total) under the tongue every 5 (five) minutes as needed for chest pain.     POTASSIUM PO  Take 550 mg by mouth daily.     spironolactone 25 MG tablet  Commonly known as:  ALDACTONE  Take 0.5 tablets (12.5 mg total) by mouth daily.       Follow-up Information    Follow up with [facility] On 04/22/2015.   Specialty:  Cardiology   Why:  2:00 pm    Contact information:   [address] [id number] [place] [phone]      Follow up with Dorothy Spark, MD On 05/29/2015.   Specialty:  Cardiology   Why:  8:15 am   Contact information:   [place] [address] [phone]      TIME SPENT ON DISCHARGE, INCLUDING PHYSICIAN TIME: >30 MINUTES   Signed: Lyda Jester 04/07/2015, 1:23 PM

## 2015-04-07 NOTE — Progress Notes (Signed)
Patient ID: [name], male   DOB: September 07, 1941, 74 y.o.   MRN: [id number]     SUBJECTIVE: Mild dyspnea with walk this morning.  No chest pain.    Scheduled Meds: . aspirin  81 mg Oral Daily  . atorvastatin  80 mg Oral q1800  . carvedilol  3.125 mg Oral BID  . clopidogrel  75 mg Oral Q breakfast  . furosemide  20 mg Oral Daily  . lisinopril  5 mg Oral Daily  . pantoprazole  40 mg Oral Q0600  . spironolactone  12.5 mg Oral Daily   Continuous Infusions:  PRN Meds:.acetaminophen, ondansetron (ZOFRAN) IV    Filed Vitals:   04/07/15 0000 04/07/15 0400 04/07/15 0600 04/07/15 0744  BP:  102/64 91/57 108/81  Pulse:  96 84 102  Temp:   97.4 F (36.3 C) 97.9 F (36.6 C)  TempSrc:   Oral Oral  Resp:   20 21  Height:      Weight: 246 lb 14.6 oz (112 kg)     SpO2:  95% 96% 94%    Intake/Output Summary (Last 24 hours) at 04/07/15 0849 Last data filed at 04/07/15 0225  Gross per 24 hour  Intake    804 ml  Output   1500 ml  Net   -696 ml    LABS: Basic Metabolic Panel:  Recent Labs  04/07/15 0329  NA 140  K 4.2  CL 105  CO2 28  GLUCOSE 110*  BUN 18  CREATININE 1.15  CALCIUM 9.1   Liver Function Tests: No results for input(s): AST, ALT, ALKPHOS, BILITOT, PROT, ALBUMIN in the last 72 hours. No results for input(s): LIPASE, AMYLASE in the last 72 hours. CBC:  Recent Labs  04/06/15 0853 04/07/15 0329  WBC 3.9* 5.4  HGB 13.5 12.8*  HCT 39.5 38.1*  MCV 97.8 98.4  PLT 145* 141*   Cardiac Enzymes: No results for input(s): CKTOTAL, CKMB, CKMBINDEX, TROPONINI in the last 72 hours. BNP: Invalid input(s): POCBNP D-Dimer: No results for input(s): DDIMER in the last 72 hours. Hemoglobin A1C: No results for input(s): HGBA1C in the last 72 hours. Fasting Lipid Panel: No results for input(s): CHOL, HDL, LDLCALC, TRIG, CHOLHDL, LDLDIRECT in the last 72 hours. Thyroid Function Tests: No results for input(s): TSH, T4TOTAL, T3FREE, THYROIDAB in the last 72  hours.  Invalid input(s): FREET3 Anemia Panel: No results for input(s): VITAMINB12, FOLATE, FERRITIN, TIBC, IRON, RETICCTPCT in the last 72 hours.  RADIOLOGY: Mr Card Morphology Wo/w Cm  03/13/2015   CLINICAL DATA:  74 year old male with cardiomyopathy of unknown etiology. H/o chemotherapy and a scar seen on Lexiscan nuclears stress test. LVEF 10-15%. LBBB.  EXAM: CARDIAC MRI  TECHNIQUE: The patient was scanned on a 1.5 Tesla GE magnet. A dedicated cardiac coil was used. Functional imaging was done using Fiesta sequences. 2,3, and 4 chamber views were done to assess for RWMA's. Modified Simpson's rule using a short axis stack was used to calculate an ejection fraction on a dedicated work Conservation officer, nature. The patient received 35 cc of Multihance. After 10 minutes inversion recovery sequences were used to assess for infiltration and scar tissue.  CONTRAST:  35 cc  of Multihance  FINDINGS: 1. There is severe left ventricular dilatation with normal wall thickness and severely decreased systolic function (LVEF = 18%).  There is severe diffuse hypokinesis with akinesis of the basal and mid inferolateral and inferior walls and severe left ventricular dyssynchrony.  LVEDD:  85 mm  LVESD:  80 mm  LVEDV:  354 ml  LVESV:  290 ml  SV:  64 ml  CO:  4.5 L/minute  Myocardial mass; 274 g  2. Normal right ventricular size, thickness and borderline systolic function (RVEF = 44%).  RVEDV:  135 ml  RVESV:  76 ml  3.  There is normal right and left atrial size.  4. Mild mitral regurgitation. Trivial aortic and tricuspid regurgitation.  5. There is thinning of the basal and mid inferolateral wall and partially of the basal and mid inferior wall associated with 75-100% late gadolinium enhancement consistent with prior myocardial infarction and no change of recovery if revascularized.  IMPRESSION: 1. There is severe left ventricular dilatation with normal wall thickness and severely decreased systolic function (LVEF =  18%).  There is severe diffuse hypokinesis with akinesis of the basal and mid inferolateral and inferior walls and severe left ventricular dyssynchrony.  2. Normal right ventricular size, thickness and borderline systolic function (RVEF = 44%).  3. Mild mitral regurgitation. Trivial aortic and tricuspid regurgitation.  4. There is thinning of the basal and mid inferolateral wall and partially of the basal and mid inferior wall associated with 75-100% late gadolinium enhancement consistent with prior myocardial infarction and no change of recovery if revascularized.  Ena Dawley   Electronically Signed   By: Ena Dawley   On: 03/13/2015 19:27    PHYSICAL EXAM General: NAD Neck: No JVD, no thyromegaly or thyroid nodule.  Lungs: Clear to auscultation bilaterally with normal respiratory effort. CV: Nondisplaced PMI.  Heart regular S1/S2, no S3/S4, no murmur.  No peripheral edema.   Abdomen: Soft, nontender, no hepatosplenomegaly, no distention.  Neurologic: Alert and oriented x 3.  Psych: Normal affect. Extremities: No clubbing or cyanosis. Right radial cath site benign.   TELEMETRY: Reviewed telemetry pt in NSR with PVCs  ASSESSMENT AND PLAN: 74 yo with history of cardiomyopathy of uncertain etiology was taken for right and left heart cath yesterday showing 90% mid LCx stenosis.  1. Chronic systolic CHF: Patient had 90% mid LCx stenosis on cath.  I do not think that this explains all his cardiomyopathy but may be playing a role.  EF appeared to be 30-35% on LV-gram, higher than on prior echo.  Filling pressures on RHC were normal.  Cardiac output was low but not markedly low.  BP stable today.  NYHA class III symptoms chronically.  - Continue current Coreg, lisinopril, and spironolactone.   - Will need followup in CHF clinic in 2 wks. - Repeat echo in 3 months, if EF remains < 35% will need CRT-D (LBBB on ECG).  - With cardiac output on the low side and soft BP, will add digoxin 0.125  daily.  2. CAD: Tight mid LCx stenosis proximal to PLOM covering large territory.  Given cardiomyopathy, it was decided that this should be intervened upon.  Now s/p DES to LCx.  He will continue ASA and Plavix 75, will change statin to atorvastatin 80 mg daily.  3. Disposition: Home today.  Will need cardiac rehab referral and followup in CHF clinic in 2 wks.  Would delay CRT-D until echo is repeated in 3 months.  Home meds: Plavix 75 daily, ASA 81 daily, digoxin 0.125 daily, Lasix 20 daily, lisinopril 5 daily, spironolactone 12.5 daily, Coreg 3.125 bid, atorvastatin 40 daily.   [name] 04/07/2015 8:56 AM

## 2015-04-08 ENCOUNTER — Encounter (HOSPITAL_COMMUNITY): Payer: Medicare Other

## 2015-04-10 ENCOUNTER — Encounter (HOSPITAL_COMMUNITY): Payer: Medicare Other

## 2015-04-13 ENCOUNTER — Encounter (HOSPITAL_COMMUNITY): Payer: Medicare Other

## 2015-04-15 ENCOUNTER — Encounter (HOSPITAL_COMMUNITY): Payer: Medicare Other

## 2015-04-17 ENCOUNTER — Encounter (HOSPITAL_COMMUNITY): Payer: Medicare Other

## 2015-04-20 ENCOUNTER — Encounter (HOSPITAL_COMMUNITY): Payer: Medicare Other

## 2015-04-22 ENCOUNTER — Encounter (HOSPITAL_COMMUNITY): Payer: Self-pay

## 2015-04-22 ENCOUNTER — Ambulatory Visit (HOSPITAL_COMMUNITY)
Admission: RE | Admit: 2015-04-22 | Discharge: 2015-04-22 | Disposition: A | Discharge status: Home / Self Care | Payer: Medicare Other | Source: Ambulatory Visit | Attending: Cardiology | Admitting: Cardiology

## 2015-04-22 ENCOUNTER — Encounter (HOSPITAL_COMMUNITY): Payer: Medicare Other

## 2015-04-22 VITALS — BP 100/60 | HR 76 | Wt 252.8 lb

## 2015-04-22 DIAGNOSIS — E669 Obesity, unspecified: Secondary | ICD-10-CM | POA: Diagnosis not present

## 2015-04-22 DIAGNOSIS — E785 Hyperlipidemia, unspecified: Secondary | ICD-10-CM | POA: Diagnosis not present

## 2015-04-22 DIAGNOSIS — I2510 Atherosclerotic heart disease of native coronary artery without angina pectoris: Secondary | ICD-10-CM | POA: Insufficient documentation

## 2015-04-22 DIAGNOSIS — I5022 Chronic systolic (congestive) heart failure: Secondary | ICD-10-CM | POA: Insufficient documentation

## 2015-04-22 DIAGNOSIS — I447 Left bundle-branch block, unspecified: Secondary | ICD-10-CM | POA: Insufficient documentation

## 2015-04-22 MED ORDER — CARVEDILOL 3.125 MG PO TABS: 6.2500 mg | ORAL_TABLET | Freq: Two times a day (BID) | ORAL | Status: DC

## 2015-04-22 NOTE — Progress Notes (Signed)
Patient ID: [name], male   DOB: [birth year], 74 y.o.   MRN: [id number] PCP: Dr. Olen Pel Primary Cardiologist: Dr. Meda Coffee AHF Cardiologist: Adithya Difrancesco is a 74 y.o. with history of chronic systolic CHF and LBBB presents for CHF clinic evaluation.  Late last fall, patient began to notice dyspnea walking up steps.  He had been known to have LBBB x around 10 years but had no other cardiac history.  He eventually had an echo showing decreased LV systolic function and a Cardiolite in 1/16 showing no ischemia but low EF.  He was started on cardiac meds, including Lasix.  He says that his weight is down 30 lbs since February.  Repeat echo in 4/16 showed EF 10-15%, and cardiac MRI showed EF 18% with a late gadolinium enhancement pattern in the inferior and inferolateral walls that may be suggestive of prior MI. CPX in 5/16 actually showed low normal functional capacity, surprising given how low his EF was.  He presents today for HF follow up.  At the pervious visit we switched him from metoprolol to Coreg and scheduled him for cath. Results as below.  Says he has noticed his pulse is much stronger since his cath.  Has had occasional nausea since the stent. He takes heartburn medication and has not noticed any relief. Currently, he is feeling pretty good.  Dyspnea only with heavy exertion. Still works full time with no problems now.  Denies chest pain or palpitations. No problems on flat ground or with stairs. Very seldomly has lightheadedness when standing rapidly, unsure if related to eyesight ref cataract surgery 3 weeks ago. No daytime sleepiness and only mild snoring. Is going to restart cardiac rehab July 18 as he is s/p stent. Sees Dr Rayann Heman and is planned for CRT-D implantation, Will need to hold for 3 months until can repeat echo. Weights around 245-250 at home.  Labs (2/16): BNP 403, LDL 59, TSH 5.4 (mildly elevated) Labs (5/16): K 4, creatinine 1.07  PMH; 1. LBBB: x 10+ years.  2.  Chronic systolic CHF: Adenosine Cardiolite (1/16) with normal perfusion.  Echo (4/16) with EF 10-15%, moderate LV dilation, mild LVH, diffuse hypokinesis.  Cardiac MRI (5/16) with severe LV dilation, EF 18%, septal-lateral dyssynchrony, diffuse hypokinesis with akinesis of the basal to mid inferior and inferolateral walls; LGE pattern suggested possible prior inferior/inferolateral MI; normal RV size and systolic function, mild MR.  CPX (5/16) with peak VO2 16.8, VE/VCO2 slope 30.4, RER 1.13 => low normal functional capacity, only mild limitation from heart failure.   [facility] 04/06/15 Right/Main without significant disease. LAD and RCA with luminal irregularities. LCx with large PLOM, 80% stenosis in the mid LCx prior to PLOM, 95% stenosis in mid LCx post-PLOM.  Right Heart pressures: RA mean 6, RV 28/5,  PA 25/12 mean 19, PCWP mean 6, LV 105/11, AO 96/67, Oxygen saturations: PA 60%, AO 95%  Cardiac Output (Fick) 4.76 Cardiac Index (Fick) 2.07Stent placed in LCx same day by Dr. Martinique, with 0% residual stenosis. 3. Hyperlipidemia 4. Obesity 5. 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   ROS: All systems reviewed and negative except as per HPI.   Current Outpatient Prescriptions  Medication Sig Dispense Refill  . aspirin 81 MG tablet Take 81 mg by mouth  daily.    . atorvastatin (LIPITOR) 80 MG tablet Take 1 tablet (80 mg total) by mouth daily at 6 PM. 30 tablet 5  . carvedilol (COREG) 3.125 MG tablet Take 1 tablet (3.125 mg total) by mouth 2 (two) times daily. 60 tablet 3  . clopidogrel (PLAVIX) 75 MG tablet Take 1 tablet (75 mg total) by mouth daily with breakfast. 30 tablet 11  . digoxin (LANOXIN) 0.125 MG tablet Take 1 tablet (0.125 mg total) by mouth daily. 30 tablet 5  . fluorouracil (EFUDEX) 5 %  cream Apply 5 application topically daily as needed (Patches on face).   0  . furosemide (LASIX) 20 MG tablet Take 20 mg by mouth daily.  0  . levothyroxine (SYNTHROID, LEVOTHROID) 50 MCG tablet Take 50 mcg by mouth daily.  5  . lisinopril (PRINIVIL,ZESTRIL) 5 MG tablet Take 1 tablet (5 mg total) by mouth daily. 30 tablet 5  . nitroGLYCERIN (NITROSTAT) 0.4 MG SL tablet Place 1 tablet (0.4 mg total) under the tongue every 5 (five) minutes as needed for chest pain. 25 tablet 2  . POTASSIUM PO Take 550 mg by mouth daily.    Marland Kitchen spironolactone (ALDACTONE) 25 MG tablet Take 0.5 tablets (12.5 mg total) by mouth daily. 15 tablet 3   No current facility-administered medications for this encounter.   Facility-Administered Medications Ordered in Other Encounters  Medication Dose Route Frequency Provider Last Rate Last Dose  . adenosine (diagnostic) (ADENOSCAN) infusion 68.4 mg  0.56 mg/kg Intravenous Once Lelon Perla, MD   68.4 mg at 11/11/14 1310   BP 100/60 mmHg  Pulse 76  Wt 252 lb 12 oz (114.647 kg)  SpO2 97% General: NAD Neck: No JVD, no thyromegaly or thyroid nodule.  Lungs: Slight crackles bibasilarly CV: Nondisplaced PMI.  Heart regular S1/S2, no S3/S4, no murmur.  0-1+ edema bilateral LEs.  No carotid bruit.  Normal pedal pulses.  Abdomen: Soft, nontender, no hepatosplenomegaly, no distention.  Skin: Intact without lesions or rashes.  Neurologic: Alert and oriented x 3.  Psych: Normal affect. Extremities: No clubbing or cyanosis.  HEENT: Normal.   Assessment/Plan: 1. Chronic systolic CHF, NYHA class II, not volume overloaded on exam. -  Mixed ischemic-nonischemic ref recent cath (possibly prior viral myocarditis, never heavy drinker or drug abuser, no family history of cardiomyopathy, also possible that he has a LBBB cardiomyopathy).  - cMRI showed a pattern of LGE in the inferior and inferolateral walls concerning for prior MI.  cardiac MRI in 5/16 showed EF 18%.  CPX in 5/16  surprisingly showed low normal functional capacity.   - Cath showed EF 30-35% with basal to mid inferior akinesis, will need repeat Echo in September prior to placement of CRT-D (LBBB). - Weight down 2 lbs since last visit.  - Edema in legs, but normal wears hose for lymphedema.  Has not been propping. - LHC showed 90% stenosis of LCx with PCI and stenting. - Increase Coreg 6.25 mg bid.  - Continue lisinopril 5 mg daily  - Continue spironolactone 12.5 mg daily - TSH high in 2/16, would repeat TSH with free T4 and T3. - Educated on keeping fluid intake < 2 L each day and limit salt intake. 2. Hyperlipidemia: Continue statin, good LDL in 2/16.  3. LBBB: Chronic.  As above, will likely eventually need CRT-D.  4. Obesity - Some snoring and daytime fatigue.  Refer sleep study. evaluation  Shirley Friar PA-C 04/22/2015  Patient seen and examined with [name], PA-C.  We discussed all aspects of the encounter. I agree with the assessment and plan as stated above.   Doing very well. NYHA II. EF improving. Volume status looks good. Will increase carvedilol to 6.25 bid as tolerated.  Now s/p LCX stent. Continue DAPT. Will repeat echo in 3 months to assess need for CRT-D. Wife tells him he snores. Send for sleep study.   Shaylon Aden,MD 10:21 PM

## 2015-04-22 NOTE — Patient Instructions (Signed)
INCREASE Carvedilol (Coreg) 6.25 mg twice daily. Call us if you are experiencing dizziness, fatigue, lightheadedness 1 week after medication change.  Will refer you to [facility] for sleep study evaluation.  Address: [address], [place], [place] [address]  [phone]  Follow up 6 weeks for routine appointment.  Will schedule echocardiogram in 3 months.  Do the following things EVERYDAY: 1) Weigh yourself in the morning before breakfast. Write it down and keep it in a log. 2) Take your medicines as prescribed 3) Eat low salt foods-Limit salt (sodium) to 2000 mg per day.  4) Stay as active as you can everyday 5) Limit all fluids for the day to less than 2 liters

## 2015-04-24 ENCOUNTER — Encounter (HOSPITAL_COMMUNITY): Payer: Medicare Other

## 2015-05-05 ENCOUNTER — Encounter (HOSPITAL_COMMUNITY): Admission: RE | Payer: Self-pay | Source: Ambulatory Visit

## 2015-05-05 ENCOUNTER — Ambulatory Visit (HOSPITAL_COMMUNITY): Admission: RE | Admit: 2015-05-05 | Payer: Medicare Other | Source: Ambulatory Visit | Admitting: Internal Medicine

## 2015-05-05 SURGERY — BIV ICD INSERTION CRT-D

## 2015-05-07 ENCOUNTER — Encounter (HOSPITAL_COMMUNITY)
Admission: RE | Admit: 2015-05-07 | Discharge: 2015-05-07 | Disposition: A | Discharge status: Home / Self Care | Payer: Medicare Other | Source: Ambulatory Visit | Attending: Cardiology | Admitting: Cardiology

## 2015-05-07 DIAGNOSIS — I509 Heart failure, unspecified: Secondary | ICD-10-CM | POA: Insufficient documentation

## 2015-05-07 NOTE — Progress Notes (Signed)
Cardiac Rehab Medication Review by a Pharmacist  Does the patient  feel that his/her medications are working for him/her?  yes  Has the patient been experiencing any side effects to the medications prescribed?  Yes. Experiencing some nausea but has tapered off. Still concerned about some unresolving fatigue however, cardiologist may adjust some medications. [facility]) Does the patient measure his/her own blood pressure or blood glucose at home?  yes occasionally  Does the patient have any problems obtaining medications due to transportation or finances?   yes  Understanding of regimen: good Understanding of indications: good Potential of compliance: good    Pharmacist comments: N/A   Seen in conjunction with Stephens November, PharmD, Pharmacy Resident. Agree with the above   Telisha Zawadzki 05/07/2015 8:41 AM

## 2015-05-11 ENCOUNTER — Encounter (HOSPITAL_COMMUNITY)
Admission: RE | Admit: 2015-05-11 | Discharge: 2015-05-11 | Disposition: A | Discharge status: Home / Self Care | Payer: Medicare Other | Source: Ambulatory Visit | Attending: Cardiology | Admitting: Cardiology

## 2015-05-11 NOTE — Progress Notes (Signed)
Pt arrived ambulatory to Cardiac Rehab department.  Pt observed hobbling with shuffle gait.  Pt stated that over the weekend he was working and stepped off a ladder and fell.  Pt complains that the right knee feels tight.  Pt did not have his knee evaluated at the time of injury.  Sunday he spent most of the day on the couch.  Advised pt not to exercise today due to not knowing the extent of the injury.  Pt will see his primary Md today for evaluation.  Asked pt to please notify rehab with the evaluation results and recommendations for exercise.  Pt given rehab contact phone number. Cherre Huger, BSN

## 2015-05-12 ENCOUNTER — Telehealth (HOSPITAL_COMMUNITY): Payer: Self-pay | Admitting: *Deleted

## 2015-05-13 ENCOUNTER — Encounter (HOSPITAL_COMMUNITY): Payer: Medicare Other

## 2015-05-15 ENCOUNTER — Encounter (HOSPITAL_COMMUNITY): Payer: Medicare Other

## 2015-05-18 ENCOUNTER — Encounter (HOSPITAL_COMMUNITY): Payer: Medicare Other

## 2015-05-19 ENCOUNTER — Telehealth (HOSPITAL_COMMUNITY): Payer: Self-pay | Admitting: Cardiac Rehabilitation

## 2015-05-19 NOTE — Telephone Encounter (Signed)
pc received from pt he will continue to be absent from cardiac rehab this week due to continued knee pain. Pt was evaluated by his PCP who advised him to avoid strenuous activity x 1 week, use ICE and advil. Pt advised to notify his PCP if symptoms unimproved or worsen.  Understanding verbalized.

## 2015-05-20 ENCOUNTER — Encounter (HOSPITAL_COMMUNITY): Payer: Medicare Other

## 2015-05-22 ENCOUNTER — Encounter (HOSPITAL_COMMUNITY): Payer: Medicare Other

## 2015-05-25 ENCOUNTER — Encounter (HOSPITAL_COMMUNITY)
Admission: RE | Admit: 2015-05-25 | Discharge: 2015-05-25 | Disposition: A | Discharge status: Home / Self Care | Payer: Medicare Other | Source: Ambulatory Visit | Attending: Cardiology | Admitting: Cardiology

## 2015-05-25 ENCOUNTER — Encounter (HOSPITAL_COMMUNITY): Payer: Self-pay

## 2015-05-25 DIAGNOSIS — I509 Heart failure, unspecified: Secondary | ICD-10-CM | POA: Diagnosis not present

## 2015-05-25 NOTE — Progress Notes (Addendum)
Pt started cardiac rehab today.  Pt tolerated light exercise without difficulty. Pt did not c/o knee discomfort.  VSS, telemetry-sinus rhythm, 1st degree AV block, LBBB, occasional PVC, asymptomatic.  Medication list reconciled.  Pt verbalized compliance with medications and denies barriers to compliance. PSYCHOSOCIAL ASSESSMENT:  PHQ-0. Pt exhibits positive coping skills, hopeful outlook with supportive family. No psychosocial needs identified at this time, no psychosocial interventions necessary.    Pt works from home.    Pt cardiac rehab  goal is  to improve his cardiac health and lose weight.  Pt educated about importance of medication compliance, diet and exercise for improvement in his heart function.   Pt encouraged to participate in home exercise with cardiac and nutrition education classes to increase ability to achieve these goals.   Pt oriented to exercise equipment and routine.  Understanding verbalized.

## 2015-05-27 ENCOUNTER — Encounter: Payer: Self-pay | Admitting: Pulmonary Disease

## 2015-05-27 ENCOUNTER — Ambulatory Visit (INDEPENDENT_AMBULATORY_CARE_PROVIDER_SITE_OTHER): Payer: Medicare Other | Admitting: Pulmonary Disease

## 2015-05-27 ENCOUNTER — Encounter (HOSPITAL_COMMUNITY)
Admission: RE | Admit: 2015-05-27 | Discharge: 2015-05-27 | Disposition: A | Discharge status: Home / Self Care | Payer: Medicare Other | Source: Ambulatory Visit | Attending: Cardiology | Admitting: Cardiology

## 2015-05-27 VITALS — BP 122/74 | HR 64 | Ht 71.0 in | Wt 253.6 lb

## 2015-05-27 DIAGNOSIS — I509 Heart failure, unspecified: Secondary | ICD-10-CM | POA: Diagnosis not present

## 2015-05-27 DIAGNOSIS — G4733 Obstructive sleep apnea (adult) (pediatric): Secondary | ICD-10-CM | POA: Diagnosis not present

## 2015-05-27 NOTE — Assessment & Plan Note (Addendum)
The main issue here is his excessive daytime fatigue, he does not seem to be very sleepy Given excessive daytime fatigue, narrow pharyngeal exam,  & loud snoring, obstructive sleep apnea is possible & an overnight polysomnogram will be scheduled as a split study. The pathophysiology of obstructive sleep apnea , it's cardiovascular consequences & modes of treatment including CPAP were discused with the patient in detail & they evidenced understanding. Pre test prob is intermediate Given cardiac comorbidities, we cannot perform a home study and hence attended polysomnogram would be suggested.

## 2015-05-27 NOTE — Progress Notes (Signed)
Quality of LIfe Survey:  Pt completed Quality of Life survey as a participant in Cardiac Rehab.  Overall pt scores were satisfactory.  Pt expresses concern about his continued fatigue. 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  Pt reports it is improving however he continues to need daily naps.  Pt reports significant improvement in his dyspnea on exertion with his recent cardiac rehab participation and is looking forward to more improvement with this participation.  Pt denies other areas of concern, has supportive family and enjoyable career. Pt has positive outlook with good coping skills.  Will continue to monitor.

## 2015-05-27 NOTE — Progress Notes (Signed)
Subjective:    Patient ID: [name], male    DOB: [birth year], 74 y.o.   MRN: [id number]  HPI  Chief Complaint  Patient presents with  . Sleep Consult    Referred by: Dr. Francesca Oman office; snoring; Cardiology rehab started in Mid- march of this year.  LBB blockage.  Went through Chemo/Radiation. causes severe fatigue. 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 He has a history of chronic systolic CHF and LBBB  echo in 4/16 showed EF 10-15%, stent in 03/2015 , plan is for rpt echo in 3 months and then to evaluate for resynchronization therapy. He is undergoing cardiac rehabilitation. 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 Epworth sleepiness score is 3. He does admit to excessive daytime fatigue. He denies others sleep issues and feels that he sleeps well. Bedtime is around 9 PM, sleep latency is about 30 minutes, he sleeps on his right side with one pillow, reports 2-3 nocturnal awakenings for nocturia and is out of bed by 6 AM feeling refreshed without dryness of mouth or headaches. He does admit to napping daily for about an hour, naps are refreshing. He has lost 30 pounds over the last 1 year. There is no history suggestive of cataplexy, sleep paralysis or parasomnias He smoked less than 10 pack years before he quit in 1985 He denies alcohol usage   Past Medical History  Diagnosis Date  . Obesity   . Hyperlipidemia   . LBBB (left bundle branch block)   . Prediabetes   . Hyperplastic colon polyp   . Degenerative disc disease, thoracic   . Acid reflux   . Nonischemic cardiomyopathy   . CAD (coronary artery disease)   . Merkel cell cancer 2006    s/p gluteal resection, lymph node dissection, chemo/ radiation  . Shortness of breath dyspnea   . CHF (congestive heart failure)     Past Surgical History  Procedure Laterality  Date  . Excision of merkel cell  2006  . Left knee surgery  1960  . Colonoscopy  2009  . Left and right cataract sugery    . Cardiac catheterization  04/06/2015  . Cardiac catheterization N/A 04/06/2015    Procedure: Right/Left Heart Cath and Coronary Angiography;  Surgeon: Larey Dresser, MD;  Location: [facility];  Service: Cardiovascular;  Laterality: N/A;  . Cardiac catheterization N/A 04/06/2015    Procedure: Coronary Stent Intervention;  Surgeon: Peter M Martinique, MD;  Location: [facility];  Service: Cardiovascular;  Laterality: N/A;   Allergies  Allergen Reactions  . Tape Rash    History   Social History  . Marital Status: Married    Spouse Name: bridgette  . Number of Children: 2  . Years of Education: college   Occupational History  . [employer]    Social History Main Topics  . Smoking status: Former Smoker -- 0.25 packs/day for 15 years    Types: Cigarettes    Quit date: 10/25/1983  . Smokeless tobacco: Never Used  . Alcohol Use: 0.0 oz/week    0 Standard drinks or equivalent per week     Comment: wine occasionally  . Drug Use: No  . Sexual Activity: Not on file   Other Topics Concern  . Not on file   Social History Narrative   Pt lives in [place], married x 45 years.  2  grown sons have HTN.   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 Cancer Father     prostate  . Cancer Mother     breast  . Lupus Mother   . Stroke Sister   . Thyroid disease Sister      Review of Systems  Constitutional: Negative for fever, chills, activity change, appetite change and unexpected weight change.  HENT: Negative for congestion, dental problem, postnasal drip, rhinorrhea, sneezing, sore throat, trouble swallowing and voice change.   Eyes: Negative for visual disturbance.  Respiratory: Negative for cough, choking and shortness of breath.   Cardiovascular: Negative for chest pain  and leg swelling.  Gastrointestinal: Negative for nausea, vomiting and abdominal pain.  Genitourinary: Negative for difficulty urinating.  Musculoskeletal: Negative for arthralgias.  Skin: Negative for rash.  Psychiatric/Behavioral: Negative for behavioral problems and confusion.       Objective:   Physical Exam  Gen. Pleasant, obese, in no distress, normal affect ENT - no lesions, no post nasal drip, class 2-3 airway Neck: No JVD, no thyromegaly, no carotid bruits Lungs: no use of accessory muscles, no dullness to percussion, decreased without rales or rhonchi  Cardiovascular: Rhythm regular, heart sounds  normal, no murmurs or gallops, no peripheral edema Abdomen: soft and non-tender, no hepatosplenomegaly, BS normal. Musculoskeletal: No deformities, no cyanosis or clubbing Neuro:  alert, non focal, no tremors       Assessment & Plan:

## 2015-05-27 NOTE — Patient Instructions (Signed)
Sleep study will be scheduled 

## 2015-05-29 ENCOUNTER — Encounter (HOSPITAL_COMMUNITY): Payer: Medicare Other

## 2015-05-29 ENCOUNTER — Ambulatory Visit (INDEPENDENT_AMBULATORY_CARE_PROVIDER_SITE_OTHER): Payer: Medicare Other | Admitting: Cardiology

## 2015-05-29 ENCOUNTER — Encounter: Payer: Self-pay | Admitting: Cardiology

## 2015-05-29 ENCOUNTER — Encounter (HOSPITAL_COMMUNITY)
Admission: RE | Admit: 2015-05-29 | Discharge: 2015-05-29 | Disposition: A | Discharge status: Home / Self Care | Payer: Medicare Other | Source: Ambulatory Visit | Attending: Cardiology | Admitting: Cardiology

## 2015-05-29 VITALS — BP 114/62 | HR 76 | Ht 71.0 in | Wt 249.0 lb

## 2015-05-29 DIAGNOSIS — I5022 Chronic systolic (congestive) heart failure: Secondary | ICD-10-CM | POA: Diagnosis not present

## 2015-05-29 DIAGNOSIS — E785 Hyperlipidemia, unspecified: Secondary | ICD-10-CM

## 2015-05-29 DIAGNOSIS — R05 Cough: Secondary | ICD-10-CM | POA: Diagnosis not present

## 2015-05-29 DIAGNOSIS — R059 Cough, unspecified: Secondary | ICD-10-CM | POA: Insufficient documentation

## 2015-05-29 DIAGNOSIS — I2510 Atherosclerotic heart disease of native coronary artery without angina pectoris: Secondary | ICD-10-CM

## 2015-05-29 DIAGNOSIS — I509 Heart failure, unspecified: Secondary | ICD-10-CM | POA: Diagnosis not present

## 2015-05-29 LAB — COMPREHENSIVE METABOLIC PANEL
ALT: 20 U/L (ref 0–53)
AST: 17 U/L (ref 0–37)
Albumin: 4.3 g/dL (ref 3.5–5.2)
Alkaline Phosphatase: 64 U/L (ref 39–117)
BUN: 22 mg/dL (ref 6–23)
CO2: 29 mEq/L (ref 19–32)
Calcium: 9.5 mg/dL (ref 8.4–10.5)
Chloride: 104 mEq/L (ref 96–112)
Creatinine, Ser: 1.13 mg/dL (ref 0.40–1.50)
GFR: 67.43 mL/min (ref >60.00)
Glucose, Bld: 115 mg/dL — ABNORMAL HIGH (ref 70–99)
Potassium: 4.4 mEq/L (ref 3.5–5.1)
Sodium: 139 mEq/L (ref 135–145)
Total Bilirubin: 0.8 mg/dL (ref 0.2–1.2)
Total Protein: 7.2 g/dL (ref 6.0–8.3)

## 2015-05-29 MED ORDER — LOSARTAN POTASSIUM 25 MG PO TABS: 25.0000 mg | ORAL_TABLET | Freq: Every day | ORAL | Status: DC

## 2015-05-29 NOTE — Progress Notes (Signed)
Patient ID: [name], male   DOB: 1941/07/22, 74 y.o.   MRN: [id number] Patient ID: [name], male   DOB: 02-24-1941, 74 y.o.   MRN: [id number]  PCP: Dr. Olen Pel Primary Cardiologist: Dr. Meda Coffee AHF Cardiologist: Aundra Dubin  Chief complain: Cough  [name] is a 74 y.o. with history of chronic systolic CHF and LBBB presents for CHF clinic evaluation.  Late last fall, patient began to notice dyspnea walking up steps.  He had been known to have LBBB x around 10 years but had no other cardiac history.  He eventually had an echo showing decreased LV systolic function and a Cardiolite in 1/16 showing no ischemia but low EF.  He was started on cardiac meds, including Lasix.  He says that his weight is down 30 lbs since February.  Repeat echo in 4/16 showed EF 10-15%, and cardiac MRI showed EF 18% with a late gadolinium enhancement pattern in the inferior and inferolateral walls that may be suggestive of prior MI. CPX in 5/16 actually showed low normal functional capacity, surprising given how low his EF was.  He presents today for HF follow up.  At the pervious visit we switched him from metoprolol to Coreg and scheduled him for cath. Results as below.  Says he has noticed his pulse is much stronger since his cath.  Has had occasional nausea since the stent. He takes heartburn medication and has not noticed any relief. Currently, he is feeling pretty good.  Dyspnea only with heavy exertion. Still works full time with no problems now.  Denies chest pain or palpitations. No problems on flat ground or with stairs. Very seldomly has lightheadedness when standing rapidly, unsure if related to eyesight ref cataract surgery 3 weeks ago. No daytime sleepiness and only mild snoring. Is going to restart cardiac rehab July 18 as he is s/p stent. Sees Dr Rayann Heman and is planned for CRT-D implantation, Will need to hold for 3 months until can repeat echo. Weights around 245-250 at home.  05/29/2015 - 6 weeks  follow up, he feels better, improved pulse and fatigue, works full time, takes nap after lunch, no orthopnea or PND, no LE edema. No chest pain, palpitations or syncope, complaint with ,meds, developed night only cough since on new meds.   Labs (2/16): BNP 403, LDL 59, TSH 5.4 (mildly elevated) Labs (5/16): K 4, creatinine 1.07  PMH; 1. LBBB: x 10+ years.  2. Chronic systolic CHF: Adenosine Cardiolite (1/16) with normal perfusion.  Echo (4/16) with EF 10-15%, moderate LV dilation, mild LVH, diffuse hypokinesis.  Cardiac MRI (5/16) with severe LV dilation, EF 18%, septal-lateral dyssynchrony, diffuse hypokinesis with akinesis of the basal to mid inferior and inferolateral walls; LGE pattern suggested possible prior inferior/inferolateral MI; normal RV size and systolic function, mild MR.  CPX (5/16) with peak VO2 16.8, VE/VCO2 slope 30.4, RER 1.13 => low normal functional capacity, only mild limitation from heart failure.   [facility] 04/06/15 Right/Main without significant disease. LAD and RCA with luminal irregularities. LCx with large PLOM, 80% stenosis in the mid LCx prior to PLOM, 95% stenosis in mid LCx post-PLOM.  Right Heart pressures: RA mean 6, RV 28/5,  PA 25/12 mean 19, PCWP mean 6, LV 105/11, AO 96/67, Oxygen saturations: PA 60%, AO 95%  Cardiac Output (Fick) 4.76 Cardiac Index (Fick) 2.07Stent placed in LCx same day by Dr. Martinique, with 0% residual stenosis. 3. Hyperlipidemia 4. Obesity 5. OA 6. Merkel cell cancer (gluteal area) s/p chemotherapy and  radiation.   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   ROS: All systems reviewed and negative except as per HPI.   Current Outpatient Prescriptions  Medication Sig Dispense Refill  . aspirin 81 MG tablet Take 81 mg by mouth daily.    Marland Kitchen atorvastatin (LIPITOR) 80 MG tablet Take 1 tablet (80 mg total) by mouth daily at 6  PM. 30 tablet 5  . carvedilol (COREG) 3.125 MG tablet Take 3.125 mg by mouth 2 (two) times daily with a meal.    . clopidogrel (PLAVIX) 75 MG tablet Take 1 tablet (75 mg total) by mouth daily with breakfast. 30 tablet 11  . digoxin (LANOXIN) 0.125 MG tablet Take 1 tablet (0.125 mg total) by mouth daily. 30 tablet 5  . fluorouracil (EFUDEX) 5 % cream Apply 5 application topically daily as needed (Patches on face).   0  . furosemide (LASIX) 20 MG tablet Take 20 mg by mouth daily.  0  . levothyroxine (SYNTHROID, LEVOTHROID) 50 MCG tablet Take 50 mcg by mouth daily.  5  . lisinopril (PRINIVIL,ZESTRIL) 5 MG tablet Take 1 tablet (5 mg total) by mouth daily. 30 tablet 5  . nitroGLYCERIN (NITROSTAT) 0.4 MG SL tablet Place 1 tablet (0.4 mg total) under the tongue every 5 (five) minutes as needed for chest pain. 25 tablet 2  . POTASSIUM PO Take 550 mg by mouth daily.    Marland Kitchen spironolactone (ALDACTONE) 25 MG tablet Take 0.5 tablets (12.5 mg total) by mouth daily. 15 tablet 3   No current facility-administered medications for this visit.   Facility-Administered Medications Ordered in Other Visits  Medication Dose Route Frequency Provider Last Rate Last Dose  . adenosine (diagnostic) (ADENOSCAN) infusion 68.4 mg  0.56 mg/kg Intravenous Once Lelon Perla, MD   68.4 mg at 11/11/14 1310   BP 114/62 mmHg  Pulse 76  Ht 5\' 11"  (1.803 m)  Wt 249 lb (112.946 kg)  BMI 34.74 kg/m2  SpO2 95% General: NAD Neck: No JVD, no thyromegaly or thyroid nodule.  Lungs: Slight crackles bibasilarly CV: Nondisplaced PMI.  Heart regular S1/S2, no S3/S4, no murmur.  0-1+ edema bilateral LEs.  No carotid bruit.  Normal pedal pulses.  Abdomen: Soft, nontender, no hepatosplenomegaly, no distention.  Skin: Intact without lesions or rashes.  Neurologic: Alert and oriented x 3.  Psych: Normal affect. Extremities: No clubbing or cyanosis.  HEENT: Normal.    Assessment/Plan:  1. Chronic systolic CHF, NYHA class II, not  volume overloaded on exam. -  Mixed ischemic-nonischemic ref recent cath (possibly prior viral myocarditis, never heavy drinker or drug abuser, no family history of cardiomyopathy, also possible that he has a LBBB cardiomyopathy).  - cMRI showed a pattern of LGE in the inferior and inferolateral walls concerning for prior MI.  cardiac MRI in 5/16 showed EF 18%.  CPX in 5/16 surprisingly showed low normal functional capacity.   - Cath showed EF 30-35% with basal to mid inferior akinesis, will need repeat Echo in September prior to placement of CRT-D (LBBB). - Weight down 3 lbs since last visit.  - Edema in legs, but normal wears hose for lymphedema.  Has not been propping. - LHC showed 90% stenosis of LCx with PCI and stenting. - Increase Coreg 6.25 mg bid.  - discontinue lisinopril 5 mg daily  - start losartan 25 mg po daily -  Continue spironolactone 12.5 mg daily - TSH high in 2/16, would repeat TSH with free T4 and T3. - Educated on keeping fluid intake < 2 L each day and limit salt intake.  - schedule echo 3 months post intervention, if LVEF < 35% --> BiV ICD  2. Hyperlipidemia: Continue statin, good LDL in 2/16.  3. LBBB: Chronic.  As above, will likely eventually need CRT-D.  4. Obesity - Some snoring and daytime fatigue.  Refer sleep study. Evaluation 5. CAD - s/p PCI to LCX in 03/2015  Today CMP.  Dorothy Spark, MD 05/29/2015

## 2015-05-29 NOTE — Patient Instructions (Signed)
Medication Instructions:   STOP TAKING LISINOPRIL NOW  START TAKING LOSARTAN 25 MG ONCE DAILY    Your physician recommends that you return for lab work in:  TODAY---CMET     Testing/Procedures:  Your physician has requested that you have an echocardiogram. Echocardiography is a painless test that uses sound waves to create images of your heart. It provides your doctor with information about the size and shape of your heart and how well your heart's chambers and valves are working. This procedure takes approximately one hour. There are no restrictions for this procedure.  PER DR NELSON THIS NEEDS TO BE SCHEDULED IN ONE MONTH AT SOMETIME OF THE BEGINNING OF September, FOR WORK-UP PURPOSES AND INSURANCE PURPOSES    Follow-Up:  2 MONTHS WITH DR Meda Coffee

## 2015-06-01 ENCOUNTER — Encounter (HOSPITAL_COMMUNITY)
Admission: RE | Admit: 2015-06-01 | Discharge: 2015-06-01 | Disposition: A | Discharge status: Home / Self Care | Payer: Medicare Other | Source: Ambulatory Visit | Attending: Cardiology | Admitting: Cardiology

## 2015-06-01 DIAGNOSIS — I509 Heart failure, unspecified: Secondary | ICD-10-CM | POA: Diagnosis not present

## 2015-06-01 NOTE — Progress Notes (Signed)
[name] 74 y.o. male Nutrition Note Spoke with pt. Nutrition Plan and Nutrition Survey goals reviewed with pt. Pt is following Step 1 of the Therapeutic Lifestyle Changes diet. Pt wants to lose wt. Pt has been trying to lose wt by decreasing portion sizes consumed. Wt loss tips reviewed.  Pt with dx of CHF. Per discussion, pt's wife does not use canned/convenience foods often when she cooks. Pt rarely adds salt to food. Pt eats out "about 3 times a week, which is down from about 5 times a week." Ways to decrease sodium when eating out discussed. Pt expressed understanding of the information reviewed. Pt aware of nutrition education classes offered.  No results found for: HGBA1C  Nutrition Diagnosis ? Food-and nutrition-related knowledge deficit related to lack of exposure to information as related to diagnosis of: ? CVD ? ? Obesity related to excessive energy intake as evidenced by a BMI of 36.0  Nutrition RX/ Estimated Daily Nutrition Needs for: wt loss 1650-2150 Kcal, 45-60 gm fat, 10-14 gm sat fat, 1.6-2.2 gm trans-fat, <1500 mg sodium  Nutrition Intervention ? Pt's individual nutrition plan reviewed with pt. ? Benefits of adopting Therapeutic Lifestyle Changes discussed when Medficts reviewed. ? Pt to attend the Portion Distortion class ? Pt to attend the   ? Nutrition I class                  ? Nutrition II class ? Pt given handouts for: ? Nutrition I class ? Nutrition II class ? Continue client-centered nutrition education by RD, as part of interdisciplinary care. Goal(s) ? Pt to identify and limit food sources of saturated fat, trans fat, and cholesterol ? Pt to identify food quantities necessary to achieve: ? wt loss to a goal wt of 226-244 lb (103-111.2 kg) at graduation from cardiac rehab.  Monitor and Evaluate progress toward nutrition goal with team. Nutrition Risk: Change to Moderate [name], M.Ed, RD, LDN, CDE 06/01/2015 11:25 AM

## 2015-06-03 ENCOUNTER — Encounter (HOSPITAL_COMMUNITY)
Admission: RE | Admit: 2015-06-03 | Discharge: 2015-06-03 | Disposition: A | Discharge status: Home / Self Care | Payer: Medicare Other | Source: Ambulatory Visit | Attending: Cardiology | Admitting: Cardiology

## 2015-06-03 DIAGNOSIS — I509 Heart failure, unspecified: Secondary | ICD-10-CM | POA: Diagnosis not present

## 2015-06-05 ENCOUNTER — Encounter (HOSPITAL_COMMUNITY)
Admission: RE | Admit: 2015-06-05 | Discharge: 2015-06-05 | Disposition: A | Discharge status: Home / Self Care | Payer: Medicare Other | Source: Ambulatory Visit | Attending: Cardiology | Admitting: Cardiology

## 2015-06-05 DIAGNOSIS — I509 Heart failure, unspecified: Secondary | ICD-10-CM | POA: Diagnosis not present

## 2015-06-05 NOTE — Progress Notes (Signed)
Reviewed home exercise with pt today.  Pt plans to walk and attend fitness center for exercise, in addition to CRPII.  Reviewed THR, pulse, RPE, sign and symptoms, NTG use, and when to call 911 or MD.  Pt voiced understanding.     [name]

## 2015-06-08 ENCOUNTER — Encounter (HOSPITAL_COMMUNITY)
Admission: RE | Admit: 2015-06-08 | Discharge: 2015-06-08 | Disposition: A | Discharge status: Home / Self Care | Payer: Medicare Other | Source: Ambulatory Visit | Attending: Cardiology | Admitting: Cardiology

## 2015-06-08 DIAGNOSIS — I509 Heart failure, unspecified: Secondary | ICD-10-CM | POA: Diagnosis not present

## 2015-06-10 ENCOUNTER — Encounter (HOSPITAL_COMMUNITY)
Admission: RE | Admit: 2015-06-10 | Discharge: 2015-06-10 | Disposition: A | Discharge status: Home / Self Care | Payer: Medicare Other | Source: Ambulatory Visit | Attending: Cardiology | Admitting: Cardiology

## 2015-06-10 DIAGNOSIS — I509 Heart failure, unspecified: Secondary | ICD-10-CM | POA: Diagnosis not present

## 2015-06-12 ENCOUNTER — Encounter (HOSPITAL_COMMUNITY)
Admission: RE | Admit: 2015-06-12 | Discharge: 2015-06-12 | Disposition: A | Discharge status: Home / Self Care | Payer: Medicare Other | Source: Ambulatory Visit | Attending: Cardiology | Admitting: Cardiology

## 2015-06-12 DIAGNOSIS — I509 Heart failure, unspecified: Secondary | ICD-10-CM | POA: Diagnosis not present

## 2015-06-15 ENCOUNTER — Encounter (HOSPITAL_COMMUNITY)
Admission: RE | Admit: 2015-06-15 | Discharge: 2015-06-15 | Disposition: A | Discharge status: Home / Self Care | Payer: Medicare Other | Source: Ambulatory Visit | Attending: Cardiology | Admitting: Cardiology

## 2015-06-15 DIAGNOSIS — I509 Heart failure, unspecified: Secondary | ICD-10-CM | POA: Diagnosis not present

## 2015-06-17 ENCOUNTER — Encounter (HOSPITAL_COMMUNITY)
Admission: RE | Admit: 2015-06-17 | Discharge: 2015-06-17 | Disposition: A | Discharge status: Home / Self Care | Payer: Medicare Other | Source: Ambulatory Visit | Attending: Cardiology | Admitting: Cardiology

## 2015-06-17 DIAGNOSIS — I509 Heart failure, unspecified: Secondary | ICD-10-CM | POA: Diagnosis not present

## 2015-06-19 ENCOUNTER — Encounter (HOSPITAL_COMMUNITY)
Admission: RE | Admit: 2015-06-19 | Discharge: 2015-06-19 | Disposition: A | Discharge status: Home / Self Care | Payer: Medicare Other | Source: Ambulatory Visit | Attending: Cardiology | Admitting: Cardiology

## 2015-06-19 DIAGNOSIS — I509 Heart failure, unspecified: Secondary | ICD-10-CM | POA: Diagnosis not present

## 2015-06-22 ENCOUNTER — Encounter (HOSPITAL_COMMUNITY)
Admission: RE | Admit: 2015-06-22 | Discharge: 2015-06-22 | Disposition: A | Discharge status: Home / Self Care | Payer: Medicare Other | Source: Ambulatory Visit | Attending: Cardiology | Admitting: Cardiology

## 2015-06-22 DIAGNOSIS — I509 Heart failure, unspecified: Secondary | ICD-10-CM | POA: Diagnosis not present

## 2015-06-24 ENCOUNTER — Encounter (HOSPITAL_COMMUNITY)
Admission: RE | Admit: 2015-06-24 | Discharge: 2015-06-24 | Disposition: A | Discharge status: Home / Self Care | Payer: Medicare Other | Source: Ambulatory Visit | Attending: Cardiology | Admitting: Cardiology

## 2015-06-24 DIAGNOSIS — I509 Heart failure, unspecified: Secondary | ICD-10-CM | POA: Diagnosis not present

## 2015-06-24 NOTE — Progress Notes (Addendum)
No psychosocial needs identified, no intervention necessary. Pt is exercising on his own at home going to gym and walking at work.  Pt does feel he is increasing his strength.   Pt encouraged to continue home exercise.  Will continue to monitor.

## 2015-06-26 ENCOUNTER — Encounter (HOSPITAL_COMMUNITY)
Admission: RE | Admit: 2015-06-26 | Discharge: 2015-06-26 | Disposition: A | Discharge status: Home / Self Care | Payer: Medicare Other | Source: Ambulatory Visit | Attending: Cardiology | Admitting: Cardiology

## 2015-06-26 DIAGNOSIS — I509 Heart failure, unspecified: Secondary | ICD-10-CM | POA: Diagnosis not present

## 2015-06-30 ENCOUNTER — Other Ambulatory Visit (HOSPITAL_COMMUNITY): Payer: Medicare Other

## 2015-07-01 ENCOUNTER — Encounter (HOSPITAL_COMMUNITY)
Admission: RE | Admit: 2015-07-01 | Discharge: 2015-07-01 | Disposition: A | Discharge status: Home / Self Care | Payer: Medicare Other | Source: Ambulatory Visit | Attending: Cardiology | Admitting: Cardiology

## 2015-07-01 ENCOUNTER — Other Ambulatory Visit: Payer: Self-pay

## 2015-07-01 ENCOUNTER — Ambulatory Visit (HOSPITAL_COMMUNITY): Payer: Medicare Other | Attending: Cardiology

## 2015-07-01 DIAGNOSIS — R059 Cough, unspecified: Secondary | ICD-10-CM

## 2015-07-01 DIAGNOSIS — I509 Heart failure, unspecified: Secondary | ICD-10-CM | POA: Insufficient documentation

## 2015-07-01 DIAGNOSIS — I340 Nonrheumatic mitral (valve) insufficiency: Secondary | ICD-10-CM | POA: Insufficient documentation

## 2015-07-01 DIAGNOSIS — R05 Cough: Secondary | ICD-10-CM | POA: Diagnosis not present

## 2015-07-01 DIAGNOSIS — I2510 Atherosclerotic heart disease of native coronary artery without angina pectoris: Secondary | ICD-10-CM | POA: Diagnosis not present

## 2015-07-01 DIAGNOSIS — I5022 Chronic systolic (congestive) heart failure: Secondary | ICD-10-CM

## 2015-07-01 DIAGNOSIS — I517 Cardiomegaly: Secondary | ICD-10-CM | POA: Insufficient documentation

## 2015-07-01 DIAGNOSIS — E785 Hyperlipidemia, unspecified: Secondary | ICD-10-CM | POA: Diagnosis not present

## 2015-07-03 ENCOUNTER — Encounter (HOSPITAL_COMMUNITY)
Admission: RE | Admit: 2015-07-03 | Discharge: 2015-07-03 | Disposition: A | Discharge status: Home / Self Care | Payer: Medicare Other | Source: Ambulatory Visit | Attending: Cardiology | Admitting: Cardiology

## 2015-07-03 DIAGNOSIS — I509 Heart failure, unspecified: Secondary | ICD-10-CM | POA: Diagnosis not present

## 2015-07-06 ENCOUNTER — Encounter (HOSPITAL_COMMUNITY)
Admission: RE | Admit: 2015-07-06 | Discharge: 2015-07-06 | Disposition: A | Discharge status: Home / Self Care | Payer: Medicare Other | Source: Ambulatory Visit | Attending: Cardiology | Admitting: Cardiology

## 2015-07-06 DIAGNOSIS — I509 Heart failure, unspecified: Secondary | ICD-10-CM | POA: Diagnosis not present

## 2015-07-08 ENCOUNTER — Encounter (HOSPITAL_COMMUNITY)
Admission: RE | Admit: 2015-07-08 | Discharge: 2015-07-08 | Disposition: A | Discharge status: Home / Self Care | Payer: Medicare Other | Source: Ambulatory Visit | Attending: Cardiology | Admitting: Cardiology

## 2015-07-08 DIAGNOSIS — I509 Heart failure, unspecified: Secondary | ICD-10-CM | POA: Diagnosis not present

## 2015-07-10 ENCOUNTER — Encounter (HOSPITAL_COMMUNITY)
Admission: RE | Admit: 2015-07-10 | Discharge: 2015-07-10 | Disposition: A | Discharge status: Home / Self Care | Payer: Medicare Other | Source: Ambulatory Visit | Attending: Cardiology | Admitting: Cardiology

## 2015-07-10 ENCOUNTER — Encounter: Payer: Self-pay | Admitting: *Deleted

## 2015-07-10 ENCOUNTER — Ambulatory Visit (INDEPENDENT_AMBULATORY_CARE_PROVIDER_SITE_OTHER): Payer: Medicare Other | Admitting: Internal Medicine

## 2015-07-10 ENCOUNTER — Encounter (HOSPITAL_COMMUNITY): Payer: Medicare Other

## 2015-07-10 ENCOUNTER — Encounter: Payer: Self-pay | Admitting: Internal Medicine

## 2015-07-10 VITALS — BP 110/82 | HR 83 | Ht 71.0 in | Wt 252.2 lb

## 2015-07-10 DIAGNOSIS — I509 Heart failure, unspecified: Secondary | ICD-10-CM | POA: Diagnosis not present

## 2015-07-10 DIAGNOSIS — I5022 Chronic systolic (congestive) heart failure: Secondary | ICD-10-CM

## 2015-07-10 DIAGNOSIS — I428 Other cardiomyopathies: Secondary | ICD-10-CM

## 2015-07-10 DIAGNOSIS — I429 Cardiomyopathy, unspecified: Secondary | ICD-10-CM | POA: Diagnosis not present

## 2015-07-10 NOTE — Progress Notes (Signed)
Electrophysiology Office Note   Date:  07/10/2015   ID:  [name], [name] 1941/07/25, MRN [id number]  PCP:  Orpah Melter, MD  Cardiologist:  Dr Meda Coffee Primary Electrophysiologist: [name], MD    Chief Complaint  Patient presents with  . ICM  . Chronic systolic CHF     History of Present Illness: [name] is a 74 y.o. male who presents today for electrophysiology follow-up.    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  He reports that he began having symptoms of SOB and DOE 12/15.  His symptoms progressed.  He underwent echo 11/11/14 which revealed EF 25% with moderate MR and moderate LA enlargement.  He had a myoview 2/16 that showed no prior scar and no ischemia but dilated left ventricle with moderately to severely impaired systolic function.  He has been treated with an optimal medical regimen without improvement.  He has also recently had PCI 6/16.  Despite revascularization, echo 07/01/15 reveals EF 20-25%. He continues to have SOB with moderate activity.   He has stable LE lymphedema, controlled by a low dose lasix.     Today, he denies symptoms of palpitations, chest pain,  orthopnea, PND,  claudication, dizziness, presyncope, syncope, bleeding, or neurologic sequela. The patient is tolerating medications without difficulties and is otherwise without complaint today.    Past Medical History  Diagnosis Date  . Obesity   . Hyperlipidemia   . LBBB (left bundle branch block)   . Prediabetes   . Hyperplastic colon polyp   . Degenerative disc disease, thoracic   . Acid reflux   . Nonischemic cardiomyopathy   . CAD (coronary artery disease)   . Merkel cell cancer 2006    s/p gluteal resection, lymph node dissection, chemo/ radiation  . Shortness of breath dyspnea   . CHF (congestive heart  failure)    Past Surgical History  Procedure Laterality Date  . Excision of merkel cell  2006  . Left knee surgery  1960  . Colonoscopy  2009  . Left and right cataract sugery    . Cardiac catheterization  04/06/2015  . Cardiac catheterization N/A 04/06/2015    Procedure: Right/Left Heart Cath and Coronary Angiography;  Surgeon: Larey Dresser, MD;  Location: [place] [facility];  Service: Cardiovascular;  Laterality: N/A;  . Cardiac catheterization N/A 04/06/2015    Procedure: Coronary Stent Intervention;  Surgeon: Peter M Martinique, MD;  Location: [facility];  Service: Cardiovascular;  Laterality: N/A;     Current Outpatient Prescriptions  Medication Sig Dispense Refill  . aspirin 81 MG tablet Take 81 mg by mouth daily.    Marland Kitchen atorvastatin (LIPITOR) 80 MG tablet Take 1 tablet (80 mg total) by mouth daily at 6 PM. 30 tablet 5  . carvedilol (COREG) 3.125 MG tablet Take 3.125 mg by mouth 2 (two) times daily with a meal.    . clopidogrel (PLAVIX) 75 MG tablet Take 1 tablet (75 mg total) by mouth daily with breakfast. 30 tablet 11  . digoxin (LANOXIN) 0.125 MG tablet Take 1 tablet (0.125 mg total) by mouth daily. 30 tablet 5  . fluorouracil (EFUDEX) 5 % cream Apply 5 application topically daily as needed (Patches on face).   0  . furosemide (LASIX) 20 MG tablet Take 20 mg by mouth daily.  0  .  ibuprofen (ADVIL,MOTRIN) 200 MG tablet Take 200 mg by mouth 2 (two) times daily as needed (pain).    Marland Kitchen levothyroxine (SYNTHROID, LEVOTHROID) 50 MCG tablet Take 50 mcg by mouth daily.  5  . losartan (COZAAR) 25 MG tablet Take 1 tablet (25 mg total) by mouth daily. 90 tablet 3  . nitroGLYCERIN (NITROSTAT) 0.4 MG SL tablet Place 1 tablet (0.4 mg total) under the tongue every 5 (five) minutes as needed for chest pain. 25 tablet 2  . POTASSIUM PO Take 550 mg by mouth daily.    Marland Kitchen spironolactone (ALDACTONE) 25 MG tablet Take 0.5 tablets (12.5 mg total) by mouth daily. 15 tablet 3   No current  facility-administered medications for this visit.   Facility-Administered Medications Ordered in Other Visits  Medication Dose Route Frequency Provider Last Rate Last Dose  . adenosine (diagnostic) (ADENOSCAN) infusion 68.4 mg  0.56 mg/kg Intravenous Once Lelon Perla, MD   68.4 mg at 11/11/14 1310    Allergies:   Tape   Social History:  The patient  reports that he quit smoking about 31 years ago. His smoking use included Cigarettes. He has a 3.75 pack-year smoking history. He has never used smokeless tobacco. He reports that he drinks alcohol. He reports that he does not use illicit drugs.   Family History:  The patient's  family history includes Cancer in his father and mother; Lupus in his mother; Stroke in his sister; Thyroid disease in his sister.    ROS:  Please see the history of present illness.   All other systems are reviewed and negative.    PHYSICAL EXAM: VS:  BP 110/82 mmHg  Pulse 83  Ht 5\' 11"  (1.803 m)  Wt 114.397 kg (252 lb 3.2 oz)  BMI 35.19 kg/m2 , BMI Body mass index is 35.19 kg/(m^2). GEN: overweight, in no acute distress HEENT: normal Neck: no JVD, carotid bruits, or masses Cardiac: RRR; no murmurs, rubs, or gallops,R leg edema (chronic) Respiratory:  clear to auscultation bilaterally, normal work of breathing GI: soft, nontender, nondistended, + BS MS: no deformity or atrophy Skin: warm and dry  Neuro:  Strength and sensation are intact Psych: euthymic mood, full affect  EKG:  EKG is ordered today. The ekg ordered today shows sinus rhythm 83 bpm, PR 242, QRS 174 (LBBB), QTc491   Recent Labs: 11/25/2014: Pro B Natriuretic peptide (BNP) 403.0*; TSH 5.19* 04/07/2015: Hemoglobin 12.8*; Platelets 141* 05/29/2015: ALT 20; BUN 22; Creatinine, Ser 1.13; Potassium 4.4; Sodium 139    Lipid Panel     Component Value Date/Time   CHOL 114 11/25/2014 0747   TRIG 66.0 11/25/2014 0747   HDL 42.20 11/25/2014 0747   CHOLHDL 3 11/25/2014 0747   VLDL 13.2  11/25/2014 0747   LDLCALC 59 11/25/2014 0747     Wt Readings from Last 3 Encounters:  07/10/15 114.397 kg (252 lb 3.2 oz)  05/29/15 112.946 kg (249 lb)  05/27/15 115.032 kg (253 lb 9.6 oz)      Other studies Reviewed: Additional studies/ records that were reviewed today include: cath report, CHF clinic note, Echo Review of the above records today demonstrates: EF 20%   ASSESSMENT AND PLAN:  1.  The patient has a nonischemic CM (EF 18%), NYHA Class II/III CHF, LBBB, and CAD. At this time, he meets MADIT II/ SCD-HeFT criteria for ICD implantation for primary prevention of sudden death.  He has a LBBB with QRS >150 msec and therefore also meets criteria for CRT.  Risks, benefits,  alternatives to BiV ICD implantation were discussed in detail with the patient today. The patient  understands that the risks include but are not limited to bleeding, infection, pneumothorax, perforation, tamponade, vascular damage, renal failure, MI, stroke, death, inappropriate shocks, and lead dislodgement and wishes to proceed.  We will therefore schedule device implantation at the next available time.  2. Obesity Weight loss is advised  Current medicines are reviewed at length with the patient today.   The patient does not have concerns regarding his medicines.  The following changes were made today:  none   Signed, [name], MD  07/10/2015 8:47 AM     [address] [place] [address] [phone] (office) [phone] (fax)

## 2015-07-10 NOTE — Patient Instructions (Signed)
Medication Instructions:  Your physician recommends that you continue on your current medications as directed. Please refer to the Current Medication list given to you today.   Labwork: Your physician recommends that you return for lab work on 07/31/15 at office visit with Dr Meda Coffee: BMP/CBC   Testing/Procedures: Your physician has recommended that you have a defibrillator inserted. An implantable cardioverter defibrillator (ICD) is a small device that is placed in your chest or, in rare cases, your abdomen. This device uses electrical pulses or shocks to help control life-threatening, irregular heartbeats that could lead the heart to suddenly stop beating (sudden cardiac arrest). Leads are attached to the ICD that goes into your heart. This is done in the hospital and usually requires an overnight stay. Please see the instruction sheet given to you today for more information.    Follow-Up: Your physician recommends that you schedule a follow-up appointment in: 10-14 days from 08/04/15 with device clinic for wound check and 3 months with Dr Rayann Heman   Any Other Special Instructions Will Be Listed Below (If Applicable).

## 2015-07-13 ENCOUNTER — Other Ambulatory Visit: Payer: Self-pay | Admitting: Cardiology

## 2015-07-13 ENCOUNTER — Encounter (HOSPITAL_COMMUNITY)
Admission: RE | Admit: 2015-07-13 | Discharge: 2015-07-13 | Disposition: A | Discharge status: Home / Self Care | Payer: Medicare Other | Source: Ambulatory Visit | Attending: Cardiology | Admitting: Cardiology

## 2015-07-13 DIAGNOSIS — I509 Heart failure, unspecified: Secondary | ICD-10-CM | POA: Diagnosis not present

## 2015-07-15 ENCOUNTER — Encounter (HOSPITAL_COMMUNITY)
Admission: RE | Admit: 2015-07-15 | Discharge: 2015-07-15 | Disposition: A | Discharge status: Home / Self Care | Payer: Medicare Other | Source: Ambulatory Visit | Attending: Cardiology | Admitting: Cardiology

## 2015-07-15 DIAGNOSIS — I509 Heart failure, unspecified: Secondary | ICD-10-CM | POA: Diagnosis not present

## 2015-07-17 ENCOUNTER — Encounter (HOSPITAL_COMMUNITY)
Admission: RE | Admit: 2015-07-17 | Discharge: 2015-07-17 | Disposition: A | Discharge status: Home / Self Care | Payer: Medicare Other | Source: Ambulatory Visit | Attending: Cardiology | Admitting: Cardiology

## 2015-07-17 DIAGNOSIS — I509 Heart failure, unspecified: Secondary | ICD-10-CM | POA: Diagnosis not present

## 2015-07-20 ENCOUNTER — Encounter: Payer: Self-pay | Admitting: Cardiology

## 2015-07-20 ENCOUNTER — Other Ambulatory Visit: Payer: Self-pay | Admitting: Cardiology

## 2015-07-20 ENCOUNTER — Encounter (HOSPITAL_COMMUNITY): Payer: Medicare Other

## 2015-07-22 ENCOUNTER — Encounter (HOSPITAL_COMMUNITY): Payer: Medicare Other

## 2015-07-24 ENCOUNTER — Encounter (HOSPITAL_COMMUNITY): Payer: Medicare Other

## 2015-07-27 ENCOUNTER — Encounter (HOSPITAL_COMMUNITY)
Admission: RE | Admit: 2015-07-27 | Discharge: 2015-07-27 | Disposition: A | Discharge status: Home / Self Care | Payer: Medicare Other | Source: Ambulatory Visit | Attending: Cardiology | Admitting: Cardiology

## 2015-07-27 DIAGNOSIS — I509 Heart failure, unspecified: Secondary | ICD-10-CM | POA: Diagnosis not present

## 2015-07-27 NOTE — Progress Notes (Signed)
Pt weight up 2.2 lb today at cardiac rehab.  Pt asymptomatic, denies dyspnea or edema.  02 sat-96%.  Pt just returned from trip to [facility] for his mother-in-laws funeral.  Pt instructed to follow strict low sodium diet.  Will continue to monitor.

## 2015-07-29 ENCOUNTER — Encounter (HOSPITAL_COMMUNITY)
Admission: RE | Admit: 2015-07-29 | Discharge: 2015-07-29 | Disposition: A | Discharge status: Home / Self Care | Payer: Medicare Other | Source: Ambulatory Visit | Attending: Cardiology | Admitting: Cardiology

## 2015-07-29 DIAGNOSIS — I509 Heart failure, unspecified: Secondary | ICD-10-CM | POA: Diagnosis not present

## 2015-07-31 ENCOUNTER — Encounter: Payer: Self-pay | Admitting: *Deleted

## 2015-07-31 ENCOUNTER — Encounter (HOSPITAL_COMMUNITY)
Admission: RE | Admit: 2015-07-31 | Discharge: 2015-07-31 | Disposition: A | Discharge status: Home / Self Care | Payer: Medicare Other | Source: Ambulatory Visit | Attending: Cardiology | Admitting: Cardiology

## 2015-07-31 ENCOUNTER — Other Ambulatory Visit (INDEPENDENT_AMBULATORY_CARE_PROVIDER_SITE_OTHER): Payer: Medicare Other | Admitting: *Deleted

## 2015-07-31 ENCOUNTER — Encounter (HOSPITAL_COMMUNITY): Payer: Self-pay

## 2015-07-31 ENCOUNTER — Encounter: Payer: Self-pay | Admitting: Cardiology

## 2015-07-31 ENCOUNTER — Ambulatory Visit (INDEPENDENT_AMBULATORY_CARE_PROVIDER_SITE_OTHER): Payer: Medicare Other | Admitting: Cardiology

## 2015-07-31 VITALS — BP 100/58 | HR 59 | Ht 71.0 in | Wt 250.0 lb

## 2015-07-31 DIAGNOSIS — I2510 Atherosclerotic heart disease of native coronary artery without angina pectoris: Secondary | ICD-10-CM | POA: Diagnosis not present

## 2015-07-31 DIAGNOSIS — I5022 Chronic systolic (congestive) heart failure: Secondary | ICD-10-CM | POA: Diagnosis not present

## 2015-07-31 DIAGNOSIS — I428 Other cardiomyopathies: Secondary | ICD-10-CM

## 2015-07-31 DIAGNOSIS — I509 Heart failure, unspecified: Secondary | ICD-10-CM | POA: Diagnosis not present

## 2015-07-31 DIAGNOSIS — I420 Dilated cardiomyopathy: Secondary | ICD-10-CM

## 2015-07-31 DIAGNOSIS — I429 Cardiomyopathy, unspecified: Secondary | ICD-10-CM | POA: Diagnosis not present

## 2015-07-31 DIAGNOSIS — I447 Left bundle-branch block, unspecified: Secondary | ICD-10-CM

## 2015-07-31 DIAGNOSIS — I2583 Coronary atherosclerosis due to lipid rich plaque: Secondary | ICD-10-CM

## 2015-07-31 LAB — CBC WITH DIFFERENTIAL/PLATELET
Basophils Absolute: 0 10*3/uL (ref 0.0–0.1)
Basophils Relative: 0 % (ref 0–1)
Eosinophils Absolute: 0.1 10*3/uL (ref 0.0–0.7)
Eosinophils Relative: 2 % (ref 0–5)
HCT: 36.2 % — ABNORMAL LOW (ref 39.0–52.0)
Hemoglobin: 12.2 g/dL — ABNORMAL LOW (ref 13.0–17.0)
Lymphocytes Relative: 13 % (ref 12–46)
Lymphs Abs: 0.6 10*3/uL — ABNORMAL LOW (ref 0.7–4.0)
MCH: 33.9 pg (ref 26.0–34.0)
MCHC: 33.7 g/dL (ref 30.0–36.0)
MCV: 100.6 fL — ABNORMAL HIGH (ref 78.0–100.0)
MPV: 11.8 fL (ref 8.6–12.4)
Monocytes Absolute: 0.6 10*3/uL (ref 0.1–1.0)
Monocytes Relative: 12 % (ref 3–12)
Neutro Abs: 3.5 10*3/uL (ref 1.7–7.7)
Neutrophils Relative %: 73 % (ref 43–77)
Platelets: 159 10*3/uL (ref 150–400)
RBC: 3.6 MIL/uL — ABNORMAL LOW (ref 4.22–5.81)
RDW: 13.4 % (ref 11.5–15.5)
WBC: 4.8 10*3/uL (ref 4.0–10.5)

## 2015-07-31 LAB — BASIC METABOLIC PANEL
BUN: 16 mg/dL (ref 7–25)
CO2: 25 mmol/L (ref 20–31)
Calcium: 8.7 mg/dL (ref 8.6–10.3)
Chloride: 104 mmol/L (ref 98–110)
Creat: 1.07 mg/dL (ref 0.70–1.18)
Glucose, Bld: 98 mg/dL (ref 65–99)
Potassium: 4.1 mmol/L (ref 3.5–5.3)
Sodium: 140 mmol/L (ref 135–146)

## 2015-07-31 NOTE — Progress Notes (Signed)
Electrophysiology Office Note   Date:  07/31/2015   ID:  [name], [place] 04-10-1941, MRN [id number]  PCP:  Orpah Melter, MD  Cardiologist:  Dr Meda Coffee Primary Electrophysiologist: Dorothy Spark, MD    No chief complaint on file.    History of Present Illness: RYLEY BACHTEL is a 74 y.o. male who presents today for electrophysiology follow-up.    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  He reports that he began having symptoms of SOB and DOE 12/15.  His symptoms progressed.  He underwent echo 11/11/14 which revealed EF 25% with moderate MR and moderate LA enlargement.  He had a myoview 2/16 that showed no prior scar and no ischemia but dilated left ventricle with moderately to severely impaired systolic function.  He has been treated with an optimal medical regimen without improvement.  He has also recently had PCI 6/16.  Despite revascularization, echo 07/01/15 reveals EF 20-25%. He continues to have SOB with moderate activity.   He has stable LE lymphedema, controlled by a low dose lasix.     Today, he denies symptoms of palpitations, chest pain,  orthopnea, PND,  claudication, dizziness, presyncope, syncope, bleeding, or neurologic sequela. The patient is tolerating medications without difficulties and is otherwise without complaint today. She finished his cardiac rehab today and feels great. He is scheduled for a BiV-ICD implantation with Dr Rayann Heman on 07/25/2015.   Past Medical History  Diagnosis Date  . Obesity   . Hyperlipidemia   . LBBB (left bundle branch block)   . Prediabetes   . Hyperplastic colon polyp   . Degenerative disc disease, thoracic   . Acid reflux   . Nonischemic cardiomyopathy ([place])   . CAD (coronary artery disease)   . Merkel cell cancer ([place]) 2006    s/p gluteal resection,  lymph node dissection, chemo/ radiation  . Shortness of breath dyspnea   . CHF (congestive heart failure) [facility])    Past Surgical History  Procedure Laterality Date  . Excision of merkel cell  2006  . Left knee surgery  1960  . Colonoscopy  2009  . Left and right cataract sugery    . Cardiac catheterization  04/06/2015  . Cardiac catheterization N/A 04/06/2015    Procedure: Right/Left Heart Cath and Coronary Angiography;  Surgeon: Larey Dresser, MD;  Location: [facility];  Service: Cardiovascular;  Laterality: N/A;  . Cardiac catheterization N/A 04/06/2015    Procedure: Coronary Stent Intervention;  Surgeon: Peter M Martinique, MD;  Location: [place] [facility];  Service: Cardiovascular;  Laterality: N/A;     Current Outpatient Prescriptions  Medication Sig Dispense Refill  . aspirin 81 MG tablet Take 81 mg by mouth daily.    Marland Kitchen atorvastatin (LIPITOR) 80 MG tablet Take 1 tablet (80 mg total) by mouth daily at 6 PM. 30 tablet 5  . carvedilol (COREG) 3.125 MG tablet Take 3.125 mg by mouth 2 (two) times daily with a meal.    . clopidogrel (PLAVIX) 75 MG tablet Take 1 tablet (75 mg total) by mouth daily with breakfast. 30 tablet 11  . digoxin (LANOXIN) 0.125 MG tablet Take 1 tablet (0.125 mg total) by mouth daily. 30 tablet 5  . fluorouracil (EFUDEX) 5 % cream Apply 5 application topically daily as needed (Patches on face).   0  .  furosemide (LASIX) 20 MG tablet Take 20 mg by mouth daily.  0  . ibuprofen (ADVIL,MOTRIN) 200 MG tablet Take 200 mg by mouth 2 (two) times daily as needed (pain).    Marland Kitchen levothyroxine (SYNTHROID, LEVOTHROID) 50 MCG tablet Take 50 mcg by mouth daily.  5  . losartan (COZAAR) 25 MG tablet Take 1 tablet (25 mg total) by mouth daily. 90 tablet 3  . nitroGLYCERIN (NITROSTAT) 0.4 MG SL tablet Place 1 tablet (0.4 mg total) under the tongue every 5 (five) minutes as needed for chest pain. 25 tablet 2  . POTASSIUM PO Take 550 mg by mouth daily as needed.     Marland Kitchen  spironolactone (ALDACTONE) 25 MG tablet TAKE ONE HALF TABLETS (12.5 MG TOTAL) BY MOUTH DAILY. 15 tablet 2   No current facility-administered medications for this visit.   Facility-Administered Medications Ordered in Other Visits  Medication Dose Route Frequency Provider Last Rate Last Dose  . adenosine (diagnostic) (ADENOSCAN) infusion 68.4 mg  0.56 mg/kg Intravenous Once Lelon Perla, MD   68.4 mg at 11/11/14 1310    Allergies:   Tape   Social History:  The patient  reports that he quit smoking about 31 years ago. His smoking use included Cigarettes. He has a 3.75 pack-year smoking history. He has never used smokeless tobacco. He reports that he drinks alcohol. He reports that he does not use illicit drugs.   Family History:  The patient's  family history includes Cancer in his father and mother; Lupus in his mother; Stroke in his sister; Thyroid disease in his sister. There is no history of Heart attack or Hypertension.    ROS:  Please see the history of present illness.   All other systems are reviewed and negative.    PHYSICAL EXAM: VS:  BP 100/58 mmHg  Pulse 59  Ht 5\' 11"  (1.803 m)  Wt 250 lb (113.399 kg)  BMI 34.88 kg/m2  SpO2 93% , BMI Body mass index is 34.88 kg/(m^2). GEN: overweight, in no acute distress HEENT: normal Neck: no JVD, carotid bruits, or masses Cardiac: RRR; no murmurs, rubs, or gallops,R leg edema (chronic) Respiratory:  clear to auscultation bilaterally, normal work of breathing GI: soft, nontender, nondistended, + BS MS: no deformity or atrophy Skin: warm and dry  Neuro:  Strength and sensation are intact Psych: euthymic mood, full affect  EKG:  EKG is ordered today. The ekg ordered today shows sinus rhythm 83 bpm, PR 242, QRS 174 (LBBB), QTc491   Recent Labs: 11/25/2014: Pro B Natriuretic peptide (BNP) 403.0*; TSH 5.19* 04/07/2015: Hemoglobin 12.8*; Platelets 141* 05/29/2015: ALT 20; BUN 22; Creatinine, Ser 1.13; Potassium 4.4; Sodium 139     Lipid Panel     Component Value Date/Time   CHOL 114 11/25/2014 0747   TRIG 66.0 11/25/2014 0747   HDL 42.20 11/25/2014 0747   CHOLHDL 3 11/25/2014 0747   VLDL 13.2 11/25/2014 0747   LDLCALC 59 11/25/2014 0747     Wt Readings from Last 3 Encounters:  07/31/15 250 lb (113.399 kg)  07/10/15 252 lb 3.2 oz (114.397 kg)  05/29/15 249 lb (112.946 kg)    TTE: 07/01/2015 Study Conclusions - Left ventricle: The cavity size was normal. Wall thickness was increased in a pattern of mild LVH. Systolic function was severely reduced. The estimated ejection fraction was in the range of 20% to 25%. There is akinesis of the anteroseptal and inferoseptal myocardium. - Mitral valve: Calcified annulus. There was mild regurgitation.  Impressions: - Since  last echocardiogram, EF has mildly improved.    ASSESSMENT AND PLAN:  1.  The patient has a nonischemic CM (EF 20-25%), NYHA Class II CHF, LBBB, and CAD. At this time, he meets MADIT II/ SCD-HeFT criteria for ICD implantation for primary prevention of sudden death.  He has a LBBB with QRS >150 msec and therefore also meets criteria for CRT. He is scheduled for a device implantation on 08/04/2015. He just completed cardiac rehab, feels great. Appears euvolemic.  - continue Coreg 3.125 mg bid.  - discontinue lisinopril 5 mg daily  - start losartan 25 mg po daily - Continue spironolactone 12.5 mg daily  2. Obesity Weight loss is advised  3. CAD -   LHC showed 90% stenosis of LCx with PCI and stenting.  4. LBBB  Follow up in 3 months with echocardiogram to evaluate for LVEF post BiV-ICD.   Signed, Dorothy Spark, MD  07/31/2015 11:24 AM

## 2015-07-31 NOTE — Addendum Note (Signed)
Addended by: Eulis Foster on: 07/31/2015 09:58 AM   Modules accepted: Orders

## 2015-07-31 NOTE — Progress Notes (Signed)
Pt graduated from cardiac rehab program today with completion of 33 exercise sessions in Phase II.  Pt exited early in anticipation of ICD implant next week.  Pt maintained good attendance and progressed nicely during his participation in rehab as evidenced by increased MET level.   Medication list reconciled. Repeat  PHQ score- 0 .  Pt has made significant lifestyle changes and should be commended for his success. Pt feels he has achieved his goals during cardiac rehab, which include getting stronger and feeling better.  Pt is encouraged to be making progress with his health.    Pt plans to continue exercising on his own walking with his wife.  Recommended cardiac maintenance program to pt to increase compliance with exercise pattern.  Pt will consider.

## 2015-07-31 NOTE — Addendum Note (Signed)
Addended by: Eulis Foster on: 07/31/2015 09:57 AM   Modules accepted: Orders

## 2015-07-31 NOTE — Patient Instructions (Signed)
Medication Instructions:  Your physician recommends that you continue on your current medications as directed. Please refer to the Current Medication list given to you today.   Labwork: NONE  Testing/Procedures: Your physician has requested that you have an echocardiogram. Echocardiography is a painless test that uses sound waves to create images of your heart. It provides your doctor with information about the size and shape of your heart and how well your heart's chambers and valves are working. This procedure takes approximately one hour. There are no restrictions for this procedure.  PRIOR TO  3 MONTH  APPT  Follow-Up: Your physician recommends that you schedule a follow-up appointment in:  3 MONTHS WITH DR Meda Coffee  Any Other Special Instructions Will Be Listed Below (If Applicable).

## 2015-08-03 ENCOUNTER — Encounter (HOSPITAL_COMMUNITY): Payer: Medicare Other

## 2015-08-04 ENCOUNTER — Encounter (HOSPITAL_COMMUNITY)
Admission: RE | Disposition: A | Discharge status: Home / Self Care | Payer: Self-pay | Source: Ambulatory Visit | Attending: Internal Medicine

## 2015-08-04 ENCOUNTER — Ambulatory Visit (HOSPITAL_COMMUNITY)
Admission: RE | Admit: 2015-08-04 | Discharge: 2015-08-05 | Disposition: A | Discharge status: Home / Self Care | Payer: Medicare Other | Source: Ambulatory Visit | Attending: Internal Medicine | Admitting: Internal Medicine

## 2015-08-04 ENCOUNTER — Encounter (HOSPITAL_COMMUNITY): Payer: Self-pay | Admitting: Physician Assistant

## 2015-08-04 DIAGNOSIS — I447 Left bundle-branch block, unspecified: Secondary | ICD-10-CM | POA: Diagnosis not present

## 2015-08-04 DIAGNOSIS — Z9221 Personal history of antineoplastic chemotherapy: Secondary | ICD-10-CM | POA: Diagnosis not present

## 2015-08-04 DIAGNOSIS — E669 Obesity, unspecified: Secondary | ICD-10-CM | POA: Diagnosis not present

## 2015-08-04 DIAGNOSIS — Z79899 Other long term (current) drug therapy: Secondary | ICD-10-CM | POA: Insufficient documentation

## 2015-08-04 DIAGNOSIS — Z87891 Personal history of nicotine dependence: Secondary | ICD-10-CM | POA: Insufficient documentation

## 2015-08-04 DIAGNOSIS — Z7902 Long term (current) use of antithrombotics/antiplatelets: Secondary | ICD-10-CM | POA: Insufficient documentation

## 2015-08-04 DIAGNOSIS — E785 Hyperlipidemia, unspecified: Secondary | ICD-10-CM | POA: Diagnosis not present

## 2015-08-04 DIAGNOSIS — Z923 Personal history of irradiation: Secondary | ICD-10-CM | POA: Insufficient documentation

## 2015-08-04 DIAGNOSIS — I519 Heart disease, unspecified: Secondary | ICD-10-CM | POA: Diagnosis present

## 2015-08-04 DIAGNOSIS — I428 Other cardiomyopathies: Secondary | ICD-10-CM

## 2015-08-04 DIAGNOSIS — Z7982 Long term (current) use of aspirin: Secondary | ICD-10-CM | POA: Insufficient documentation

## 2015-08-04 DIAGNOSIS — I429 Cardiomyopathy, unspecified: Secondary | ICD-10-CM | POA: Diagnosis present

## 2015-08-04 DIAGNOSIS — Z8589 Personal history of malignant neoplasm of other organs and systems: Secondary | ICD-10-CM | POA: Insufficient documentation

## 2015-08-04 DIAGNOSIS — Z959 Presence of cardiac and vascular implant and graft, unspecified: Secondary | ICD-10-CM

## 2015-08-04 DIAGNOSIS — I5022 Chronic systolic (congestive) heart failure: Secondary | ICD-10-CM | POA: Insufficient documentation

## 2015-08-04 DIAGNOSIS — I10 Essential (primary) hypertension: Secondary | ICD-10-CM | POA: Diagnosis not present

## 2015-08-04 DIAGNOSIS — I255 Ischemic cardiomyopathy: Secondary | ICD-10-CM | POA: Diagnosis not present

## 2015-08-04 DIAGNOSIS — G4733 Obstructive sleep apnea (adult) (pediatric): Secondary | ICD-10-CM | POA: Diagnosis present

## 2015-08-04 DIAGNOSIS — I2510 Atherosclerotic heart disease of native coronary artery without angina pectoris: Secondary | ICD-10-CM | POA: Insufficient documentation

## 2015-08-04 HISTORY — PX: EP IMPLANTABLE DEVICE: SHX172B

## 2015-08-04 LAB — SURGICAL PCR SCREEN
MRSA, PCR: NEGATIVE
STAPHYLOCOCCUS AUREUS: NEGATIVE

## 2015-08-04 SURGERY — BIV ICD INSERTION CRT-D
Anesthesia: LOCAL

## 2015-08-04 MED ORDER — IOHEXOL 350 MG/ML SOLN
INTRAVENOUS | Status: DC | PRN
  Administered 2015-08-04: 15 mL via INTRAVENOUS

## 2015-08-04 MED ORDER — SODIUM CHLORIDE 0.9 % IV SOLN
INTRAVENOUS | Status: DC
  Administered 2015-08-04: 13:00:00 via INTRAVENOUS

## 2015-08-04 MED ORDER — SODIUM CHLORIDE 0.9 % IR SOLN
Status: AC
  Filled 2015-08-04: qty 2

## 2015-08-04 MED ORDER — SPIRONOLACTONE 12.5 MG HALF TABLET
12.5000 mg | ORAL_TABLET | Freq: Every day | ORAL | Status: DC
  Administered 2015-08-05: 12.5 mg via ORAL
  Filled 2015-08-04: qty 1

## 2015-08-04 MED ORDER — LIDOCAINE HCL (PF) 1 % IJ SOLN
INTRAMUSCULAR | Status: AC
  Filled 2015-08-04: qty 30

## 2015-08-04 MED ORDER — ACETAMINOPHEN 325 MG PO TABS
325.0000 mg | ORAL_TABLET | ORAL | Status: DC | PRN
  Administered 2015-08-04 – 2015-08-05 (×3): 650 mg via ORAL
  Filled 2015-08-04 (×3): qty 2

## 2015-08-04 MED ORDER — SODIUM CHLORIDE 0.9 % IV SOLN
INTRAVENOUS | Status: DC
Start: 2015-08-04 — End: 2015-08-04
  Administered 2015-08-04: 13:00:00 via INTRAVENOUS
  Administered 2015-08-04: 250 mL/h via INTRAVENOUS

## 2015-08-04 MED ORDER — ONDANSETRON HCL 4 MG/2ML IJ SOLN: 4.0000 mg | Freq: Four times a day (QID) | INTRAMUSCULAR | Status: DC | PRN

## 2015-08-04 MED ORDER — LEVOTHYROXINE SODIUM 50 MCG PO TABS
50.0000 ug | ORAL_TABLET | Freq: Every day | ORAL | Status: DC
  Administered 2015-08-05: 07:00:00 50 ug via ORAL
  Filled 2015-08-04: qty 1

## 2015-08-04 MED ORDER — ANGIOPLASTY BOOK
Freq: Once | Status: DC
  Filled 2015-08-04: qty 1

## 2015-08-04 MED ORDER — MUPIROCIN 2 % EX OINT
TOPICAL_OINTMENT | CUTANEOUS | Status: AC
  Administered 2015-08-04: 13:00:00
  Filled 2015-08-04: qty 22

## 2015-08-04 MED ORDER — HEPARIN (PORCINE) IN NACL 2-0.9 UNIT/ML-% IJ SOLN
INTRAMUSCULAR | Status: DC | PRN
  Administered 2015-08-04: 14:00:00

## 2015-08-04 MED ORDER — LOSARTAN POTASSIUM 50 MG PO TABS
25.0000 mg | ORAL_TABLET | Freq: Every day | ORAL | Status: DC
  Administered 2015-08-05: 10:00:00 25 mg via ORAL
  Filled 2015-08-04: qty 1

## 2015-08-04 MED ORDER — CEFAZOLIN SODIUM-DEXTROSE 2-3 GM-% IV SOLR
INTRAVENOUS | Status: AC
  Filled 2015-08-04: qty 50

## 2015-08-04 MED ORDER — HEPARIN (PORCINE) IN NACL 2-0.9 UNIT/ML-% IJ SOLN
INTRAMUSCULAR | Status: AC
  Filled 2015-08-04: qty 500

## 2015-08-04 MED ORDER — CARVEDILOL 3.125 MG PO TABS
3.1250 mg | ORAL_TABLET | Freq: Two times a day (BID) | ORAL | Status: DC
  Administered 2015-08-04 – 2015-08-05 (×2): 3.125 mg via ORAL
  Filled 2015-08-04 (×2): qty 1

## 2015-08-04 MED ORDER — CEFAZOLIN SODIUM 1-5 GM-% IV SOLN
1.0000 g | Freq: Four times a day (QID) | INTRAVENOUS | Status: AC
  Administered 2015-08-04 – 2015-08-05 (×3): 1 g via INTRAVENOUS
  Filled 2015-08-04 (×3): qty 50

## 2015-08-04 MED ORDER — HYDROCODONE-ACETAMINOPHEN 5-325 MG PO TABS
1.0000 | ORAL_TABLET | ORAL | Status: DC | PRN
  Administered 2015-08-05: 1 via ORAL
  Filled 2015-08-04: qty 1

## 2015-08-04 MED ORDER — MIDAZOLAM HCL 5 MG/5ML IJ SOLN
INTRAMUSCULAR | Status: AC
  Filled 2015-08-04: qty 25

## 2015-08-04 MED ORDER — LIDOCAINE HCL (PF) 1 % IJ SOLN
INTRAMUSCULAR | Status: DC | PRN
  Administered 2015-08-04: 35 mL via INTRADERMAL

## 2015-08-04 MED ORDER — SODIUM CHLORIDE 0.9 % IJ SOLN
3.0000 mL | INTRAMUSCULAR | Status: DC | PRN
Start: 2015-08-04 — End: 2015-08-04

## 2015-08-04 MED ORDER — CEFAZOLIN SODIUM-DEXTROSE 2-3 GM-% IV SOLR
2.0000 g | INTRAVENOUS | Status: AC
  Administered 2015-08-04: 2 g via INTRAVENOUS

## 2015-08-04 MED ORDER — SODIUM CHLORIDE 0.9 % IR SOLN
80.0000 mg | Status: AC
  Administered 2015-08-04: 80 mg
  Filled 2015-08-04: qty 2

## 2015-08-04 MED ORDER — FENTANYL CITRATE (PF) 100 MCG/2ML IJ SOLN
INTRAMUSCULAR | Status: AC
  Filled 2015-08-04: qty 4

## 2015-08-04 MED ORDER — SODIUM CHLORIDE 0.9 % IJ SOLN: 3.0000 mL | Freq: Two times a day (BID) | INTRAMUSCULAR | Status: DC

## 2015-08-04 MED ORDER — MIDAZOLAM HCL 5 MG/5ML IJ SOLN
INTRAMUSCULAR | Status: DC | PRN
  Administered 2015-08-04 (×5): 1 mg via INTRAVENOUS

## 2015-08-04 MED ORDER — SODIUM CHLORIDE 0.9 % IV SOLN: 250.0000 mL | INTRAVENOUS | Status: DC | PRN

## 2015-08-04 MED ORDER — MUPIROCIN 2 % EX OINT: 1.0000 "application " | TOPICAL_OINTMENT | Freq: Once | CUTANEOUS | Status: DC

## 2015-08-04 MED ORDER — FENTANYL CITRATE (PF) 100 MCG/2ML IJ SOLN
INTRAMUSCULAR | Status: DC | PRN
  Administered 2015-08-04 (×3): 12.5 ug via INTRAVENOUS

## 2015-08-04 MED ORDER — YOU HAVE A PACEMAKER BOOK
Freq: Once | Status: DC
  Filled 2015-08-04: qty 1

## 2015-08-04 MED ORDER — IOHEXOL 350 MG/ML SOLN
INTRAVENOUS | Status: DC | PRN
  Administered 2015-08-04: 25 mL via INTRACARDIAC

## 2015-08-04 SURGICAL SUPPLY — 19 items
ASSURA CRTD CD3369-40Q (ICD Generator) ×2 IMPLANT
BALLN COR SINUS VENO 6FR 80 (BALLOONS) ×2
BALLOON COR SINUS VENO 6FR 80 (BALLOONS) ×1 IMPLANT
CABLE SURGICAL S-101-97-12 (CABLE) ×2 IMPLANT
CATH ATTAIN COM SURV 6250V-MB2 (CATHETERS) ×2 IMPLANT
CATH HEXAPOLAR DAMATO 6F (CATHETERS) ×2 IMPLANT
DEFIB ASSURA MULTI-CHMBR CRT-D (ICD Generator) ×1 IMPLANT
KIT ESSENTIALS PG (KITS) ×2 IMPLANT
LEAD DURATA 7122Q-65CM (Lead) ×2 IMPLANT
LEAD QUARTET 1458Q-86CM (Lead) ×2 IMPLANT
LEAD TENDRIL SDX 2088TC-52CM (Lead) ×2 IMPLANT
PAD DEFIB LIFELINK (PAD) ×2 IMPLANT
SHEATH CLASSIC 7F (SHEATH) ×4 IMPLANT
SHEATH CLASSIC 9.5F (SHEATH) ×2 IMPLANT
SLITTER 6232ADJ (MISCELLANEOUS) ×2 IMPLANT
TRAY PACEMAKER INSERTION (CUSTOM PROCEDURE TRAY) ×2 IMPLANT
WIRE ACUITY WHISPER EDS 4648 (WIRE) ×4 IMPLANT
WIRE HI TORQ VERSACORE-J 145CM (WIRE) ×2 IMPLANT
WIRE MAILMAN 182CM (WIRE) ×2 IMPLANT

## 2015-08-04 NOTE — Discharge Summary (Signed)
ELECTROPHYSIOLOGY PROCEDURE DISCHARGE SUMMARY    Patient ID: [name],  MRN: [id number], DOB/AGE: [birth year] 74 y.o.  Admit date: 08/04/2015 Discharge date: 08/05/2015  Primary Care Physician: Orpah Melter, MD Primary Cardiologist: Meda Coffee Electrophysiologist: Hurley Sobel  Primary Discharge Diagnosis:  Ischemic cardiomyopathy, congestive heart failure, LBBB s/p CRTD this admission  Secondary Discharge Diagnosis:  1.  Obesity 2.  Hyperlipidemia 3.  CAD 4.  DDD  Allergies  Allergen Reactions  . Tape Other (See Comments)    Rash     Procedures This Admission:  1.  Implantation of a STJ CRTD on 08-04-15 by Dr Rayann Heman.  The patient received a STJ model number [phone] ICD with model number 2088 right atrial lead, 7122 right ventricular lead, and 1458 left ventricular lead.  DFT's were deferred at time of implant.  There were no immediate post procedure complications. 2.  CXR on 08-05-15 demonstrated no pneumothorax status post device implantation.   Brief HPI: ALDWIN Christensen is a 74 y.o. male was referred to electrophysiology in the outpatient setting for consideration of ICD implantation.  Past medical history includes ICM, congestive heart failure, and LBBB.  The patient has persistent LV dysfunction despite guideline directed therapy.  Risks, benefits, and alternatives to ICD implantation were reviewed with the patient who wished to proceed.   Hospital Course:  The patient was admitted and underwent implantation of a STJ CRTD with details as outlined above. He was monitored on telemetry overnight which demonstrated sinus rhythm with V pacing.  Left chest was without hematoma or ecchymosis.  The device was interrogated and found to be functioning normally.  CXR was obtained and demonstrated no pneumothorax status post device implantation.  Wound care, arm mobility, and restrictions were reviewed with the patient.  The patient was examined and considered stable for discharge  to home.   The patient's discharge medications include an ARB (Losartan) and beta blocker (Coreg).   Physical Exam: Filed Vitals:   08/04/15 1800 08/04/15 1900 08/04/15 2042 08/05/15 0344  BP: 136/73 99/57 95/58  99/65  Pulse:   83 87  Temp:   97.6 F (36.4 C) 98.6 F (37 C)  TempSrc:   Oral Oral  Resp: 24 13 16 18   Height:      Weight:    252 lb 3.3 oz (114.4 kg)  SpO2: 96% 90% 95% 93%    GEN- The patient is well appearing, alert and oriented x 3 today.   HEENT: normocephalic, atraumatic; sclera clear, conjunctiva pink; hearing intact; oropharynx clear; neck supple Lungs- Clear to ausculation bilaterally, normal work of breathing.  No wheezes, rales, rhonchi Heart- Regular rate and rhythm (paced) GI- soft, non-tender, non-distended, bowel sounds present Extremities- no clubbing, cyanosis, or edema; DP/PT/radial pulses 2+ bilaterally MS- no significant deformity or atrophy Skin- warm and dry, no rash or lesion, left chest without hematoma/ecchymosis Psych- euthymic mood, full affect Neuro- strength and sensation are intact   Labs:   Lab Results  Component Value Date   WBC 4.8 07/31/2015   HGB 12.2* 07/31/2015   HCT 36.2* 07/31/2015   MCV 100.6* 07/31/2015   PLT 159 07/31/2015     Recent Labs Lab 08/05/15 0445  NA 138  K 3.7  CL 101  CO2 25  BUN 13  CREATININE 1.07  CALCIUM 8.7*  GLUCOSE 116*    Discharge Medications:    Medication List    TAKE these medications        aspirin 81 MG tablet  Take 81  mg by mouth daily.     atorvastatin 80 MG tablet  Commonly known as:  LIPITOR  Take 1 tablet (80 mg total) by mouth daily at 6 PM.     carvedilol 3.125 MG tablet  Commonly known as:  COREG  Take 3.125 mg by mouth 2 (two) times daily with a meal.     clopidogrel 75 MG tablet  Commonly known as:  PLAVIX  Take 1 tablet (75 mg total) by mouth daily with breakfast.     digoxin 0.125 MG tablet  Commonly known as:  LANOXIN  Take 1 tablet (0.125 mg total)  by mouth daily.     fluorouracil 5 % cream  Commonly known as:  EFUDEX  Apply 5 application topically daily as needed (Patches on face).     furosemide 20 MG tablet  Commonly known as:  LASIX  Take 20 mg by mouth daily.     ibuprofen 200 MG tablet  Commonly known as:  ADVIL,MOTRIN  Take 200 mg by mouth 2 (two) times daily as needed (pain).     levothyroxine 50 MCG tablet  Commonly known as:  SYNTHROID, LEVOTHROID  Take 50 mcg by mouth daily.     losartan 25 MG tablet  Commonly known as:  COZAAR  Take 1 tablet (25 mg total) by mouth daily.     nitroGLYCERIN 0.4 MG SL tablet  Commonly known as:  NITROSTAT  Place 1 tablet (0.4 mg total) under the tongue every 5 (five) minutes as needed for chest pain.     POTASSIUM PO  Take 550 mg by mouth daily as needed.     spironolactone 25 MG tablet  Commonly known as:  ALDACTONE  TAKE ONE HALF TABLETS (12.5 MG TOTAL) BY MOUTH DAILY.        Disposition:  Discharge Instructions    Diet - low sodium heart healthy    Complete by:  As directed      Increase activity slowly    Complete by:  As directed           Follow-up Information    Follow up with [facility] On 08/17/2015.   Why:  at 9:30AM for wound check   Contact information:   [address] [place] [address]       Follow up with Patsey Berthold, NP On 09/21/2015.   Specialty:  Cardiology   Why:  at 8:40AM   Contact information:   [place] [address] [phone]       Follow up with [name], MD On 11/09/2015.   Specialty:  Cardiology   Why:  at 9:30AM   Contact information:   [address] [place] [address] [phone]       Duration of Discharge Encounter: Greater than 30 minutes including physician time.  Signed, Chanetta Marshall, NP 08/05/2015 6:54 AM   I have seen, examined the patient, and reviewed the above assessment and plan.  Device interrogation is reviewed and  normal.  CXR reveals stable leads, no ptx. Changes to above are made where necessary.    Co Sign: [name], MD 08/05/2015 8:27 AM

## 2015-08-04 NOTE — Interval H&P Note (Signed)
History and Physical Interval Note:   ICD Criteria  Current LVEF:20%. Within 12 months prior to implant: Yes   Heart failure history: Yes, Class III  Cardiomyopathy history: Yes, Non-Ischemic Cardiomyopathy.  Atrial Fibrillation/Atrial Flutter: No.  Ventricular tachycardia history: No.  Cardiac arrest history: No.  History of syndromes with risk of sudden death: No.  Previous ICD: No.  Current ICD indication: Primary   Beta Blocker therapy for 3 or more months: Yes, prescribed.   Ace Inhibitor/ARB therapy for 3 or more months: Yes, prescribed.    LBBB with QRS > 150 msec     08/04/2015 2:11 PM  [name]  has presented today for surgery, with the diagnosis of nonischemic cardiomyopathy, chf  The various methods of treatment have been discussed with the patient and family. After consideration of risks, benefits and other options for treatment, the patient has consented to  Procedure(s): BiV ICD Insertion CRT-D (N/A) as a surgical intervention .  The patient's history has been reviewed, patient examined, no change in status, stable for surgery.  I have reviewed the patient's chart and labs.  Questions were answered to the patient's satisfaction.     [name]

## 2015-08-04 NOTE — Discharge Instructions (Signed)
° ° °  Supplemental Discharge Instructions for  Pacemaker/Defibrillator Patients  Activity No heavy lifting or vigorous activity with your left/right arm for 6 to 8 weeks.  Do not raise your left/right arm above your head for one week.  Gradually raise your affected arm as drawn below.           __      08-08-15                08-09-15               08-10-15               08-11-15  NO DRIVING for  1 week   ; you may begin driving on  11-94-17   .  WOUND CARE - Keep the wound area clean and dry.  Do not get this area wet for one week. No showers for one week; you may shower on 08-11-15    . - The tape/steri-strips on your wound will fall off; do not pull them off.  No bandage is needed on the site.  DO  NOT apply any creams, oils, or ointments to the wound area. - If you notice any drainage or discharge from the wound, any swelling or bruising at the site, or you develop a fever > 101? F after you are discharged home, call the office at once.  Special Instructions - You are still able to use cellular telephones; use the ear opposite the side where you have your pacemaker/defibrillator.  Avoid carrying your cellular phone near your device. - When traveling through airports, show security personnel your identification card to avoid being screened in the metal detectors.  Ask the security personnel to use the hand wand. - Avoid arc welding equipment, MRI testing (magnetic resonance imaging), TENS units (transcutaneous nerve stimulators).  Call the office for questions about other devices. - Avoid electrical appliances that are in poor condition or are not properly grounded. - Microwave ovens are safe to be near or to operate.  Additional information for defibrillator patients should your device go off: - If your device goes off ONCE and you feel fine afterward, notify the device clinic nurses. - If your device goes off ONCE and you do not feel well afterward, call 911. - If your device goes off  TWICE, call 911. - If your device goes off THREE times in one day, call 911.  DO NOT DRIVE YOURSELF OR A FAMILY MEMBER WITH A DEFIBRILLATOR TO THE HOSPITAL--CALL 911.

## 2015-08-04 NOTE — H&P (View-Only) (Signed)
Electrophysiology Office Note   Date:  07/10/2015   ID:  [name], [name] 1940/11/03, MRN [id number]  PCP:  Orpah Melter, MD  Cardiologist:  Dr Meda Coffee Primary Electrophysiologist: [name], MD    Chief Complaint  Patient presents with  . ICM  . Chronic systolic CHF     History of Present Illness: [name] is a 74 y.o. male who presents today for electrophysiology follow-up.    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  He reports that he began having symptoms of SOB and DOE 12/15.  His symptoms progressed.  He underwent echo 11/11/14 which revealed EF 25% with moderate MR and moderate LA enlargement.  He had a myoview 2/16 that showed no prior scar and no ischemia but dilated left ventricle with moderately to severely impaired systolic function.  He has been treated with an optimal medical regimen without improvement.  He has also recently had PCI 6/16.  Despite revascularization, echo 07/01/15 reveals EF 20-25%. He continues to have SOB with moderate activity.   He has stable LE lymphedema, controlled by a low dose lasix.     Today, he denies symptoms of palpitations, chest pain,  orthopnea, PND,  claudication, dizziness, presyncope, syncope, bleeding, or neurologic sequela. The patient is tolerating medications without difficulties and is otherwise without complaint today.    Past Medical History  Diagnosis Date  . Obesity   . Hyperlipidemia   . LBBB (left bundle branch block)   . Prediabetes   . Hyperplastic colon polyp   . Degenerative disc disease, thoracic   . Acid reflux   . Nonischemic cardiomyopathy   . CAD (coronary artery disease)   . Merkel cell cancer 2006    s/p gluteal resection, lymph node dissection, chemo/ radiation  . Shortness of breath dyspnea   . CHF (congestive heart  failure)    Past Surgical History  Procedure Laterality Date  . Excision of merkel cell  2006  . Left knee surgery  1960  . Colonoscopy  2009  . Left and right cataract sugery    . Cardiac catheterization  04/06/2015  . Cardiac catheterization N/A 04/06/2015    Procedure: Right/Left Heart Cath and Coronary Angiography;  Surgeon: Larey Dresser, MD;  Location: [facility];  Service: Cardiovascular;  Laterality: N/A;  . Cardiac catheterization N/A 04/06/2015    Procedure: Coronary Stent Intervention;  Surgeon: Peter M Martinique, MD;  Location: [facility];  Service: Cardiovascular;  Laterality: N/A;     Current Outpatient Prescriptions  Medication Sig Dispense Refill  . aspirin 81 MG tablet Take 81 mg by mouth daily.    Marland Kitchen atorvastatin (LIPITOR) 80 MG tablet Take 1 tablet (80 mg total) by mouth daily at 6 PM. 30 tablet 5  . carvedilol (COREG) 3.125 MG tablet Take 3.125 mg by mouth 2 (two) times daily with a meal.    . clopidogrel (PLAVIX) 75 MG tablet Take 1 tablet (75 mg total) by mouth daily with breakfast. 30 tablet 11  . digoxin (LANOXIN) 0.125 MG tablet Take 1 tablet (0.125 mg total) by mouth daily. 30 tablet 5  . fluorouracil (EFUDEX) 5 % cream Apply 5 application topically daily as needed (Patches on face).   0  . furosemide (LASIX) 20 MG tablet Take 20 mg by mouth daily.  0  .  ibuprofen (ADVIL,MOTRIN) 200 MG tablet Take 200 mg by mouth 2 (two) times daily as needed (pain).    Marland Kitchen levothyroxine (SYNTHROID, LEVOTHROID) 50 MCG tablet Take 50 mcg by mouth daily.  5  . losartan (COZAAR) 25 MG tablet Take 1 tablet (25 mg total) by mouth daily. 90 tablet 3  . nitroGLYCERIN (NITROSTAT) 0.4 MG SL tablet Place 1 tablet (0.4 mg total) under the tongue every 5 (five) minutes as needed for chest pain. 25 tablet 2  . POTASSIUM PO Take 550 mg by mouth daily.    Marland Kitchen spironolactone (ALDACTONE) 25 MG tablet Take 0.5 tablets (12.5 mg total) by mouth daily. 15 tablet 3   No current  facility-administered medications for this visit.   Facility-Administered Medications Ordered in Other Visits  Medication Dose Route Frequency Provider Last Rate Last Dose  . adenosine (diagnostic) (ADENOSCAN) infusion 68.4 mg  0.56 mg/kg Intravenous Once Lelon Perla, MD   68.4 mg at 11/11/14 1310    Allergies:   Tape   Social History:  The patient  reports that he quit smoking about 31 years ago. His smoking use included Cigarettes. He has a 3.75 pack-year smoking history. He has never used smokeless tobacco. He reports that he drinks alcohol. He reports that he does not use illicit drugs.   Family History:  The patient's  family history includes Cancer in his father and mother; Lupus in his mother; Stroke in his sister; Thyroid disease in his sister.    ROS:  Please see the history of present illness.   All other systems are reviewed and negative.    PHYSICAL EXAM: VS:  BP 110/82 mmHg  Pulse 83  Ht 5\' 11"  (1.803 m)  Wt 114.397 kg (252 lb 3.2 oz)  BMI 35.19 kg/m2 , BMI Body mass index is 35.19 kg/(m^2). GEN: overweight, in no acute distress HEENT: normal Neck: no JVD, carotid bruits, or masses Cardiac: RRR; no murmurs, rubs, or gallops,R leg edema (chronic) Respiratory:  clear to auscultation bilaterally, normal work of breathing GI: soft, nontender, nondistended, + BS MS: no deformity or atrophy Skin: warm and dry  Neuro:  Strength and sensation are intact Psych: euthymic mood, full affect  EKG:  EKG is ordered today. The ekg ordered today shows sinus rhythm 83 bpm, PR 242, QRS 174 (LBBB), QTc491   Recent Labs: 11/25/2014: Pro B Natriuretic peptide (BNP) 403.0*; TSH 5.19* 04/07/2015: Hemoglobin 12.8*; Platelets 141* 05/29/2015: ALT 20; BUN 22; Creatinine, Ser 1.13; Potassium 4.4; Sodium 139    Lipid Panel     Component Value Date/Time   CHOL 114 11/25/2014 0747   TRIG 66.0 11/25/2014 0747   HDL 42.20 11/25/2014 0747   CHOLHDL 3 11/25/2014 0747   VLDL 13.2  11/25/2014 0747   LDLCALC 59 11/25/2014 0747     Wt Readings from Last 3 Encounters:  07/10/15 114.397 kg (252 lb 3.2 oz)  05/29/15 112.946 kg (249 lb)  05/27/15 115.032 kg (253 lb 9.6 oz)      Other studies Reviewed: Additional studies/ records that were reviewed today include: cath report, CHF clinic note, Echo Review of the above records today demonstrates: EF 20%   ASSESSMENT AND PLAN:  1.  The patient has a nonischemic CM (EF 18%), NYHA Class II/III CHF, LBBB, and CAD. At this time, he meets MADIT II/ SCD-HeFT criteria for ICD implantation for primary prevention of sudden death.  He has a LBBB with QRS >150 msec and therefore also meets criteria for CRT.  Risks, benefits,  alternatives to BiV ICD implantation were discussed in detail with the patient today. The patient  understands that the risks include but are not limited to bleeding, infection, pneumothorax, perforation, tamponade, vascular damage, renal failure, MI, stroke, death, inappropriate shocks, and lead dislodgement and wishes to proceed.  We will therefore schedule device implantation at the next available time.  2. Obesity Weight loss is advised  Current medicines are reviewed at length with the patient today.   The patient does not have concerns regarding his medicines.  The following changes were made today:  none   Signed, [name], MD  07/10/2015 8:47 AM     [address] [place] [address] [phone] (office) [phone] (fax)

## 2015-08-04 NOTE — H&P (Signed)
History and Physical  Date: 08/04/15  ID: [name], DOB 1941-07-13, MRN [id number]  PCP: Orpah Melter, MD Cardiologist: Dr Meda Coffee Primary Electrophysiologist: [name], MD   Chief Complaint  Patient presents with  . ICM  . Chronic systolic CHF    History of Present Illness: DERREL MOORE is a 74 y.o. male who presents today for elective CRT ICD implant.  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 He reported that he began having symptoms of SOB and DOE 12/15. His symptoms progressed. He underwent echo 11/11/14 which revealed EF 25% with moderate MR and moderate LA enlargement. He had a myoview 2/16 that showed no prior scar and no ischemia but dilated left ventricle with moderately to severely impaired systolic function. He has been treated with an optimal medical regimen without improvement and had PCI to the circumflex in June 16. Despite revascularization, his echo 07/01/15 reveals EF 20-25%. He continues to have SOB with moderate activity. He has stable RLE lymphedema, controlled by a low dose lasix.   Today, he denies symptoms of palpitations, chest pain, orthopnea, PND, claudication, dizziness, presyncope, syncope, bleeding, or neurologic sequela. The patient is tolerating medications without difficulties and is otherwise without complaint today.   Past Medical History  Diagnosis Date  . Obesity   . Hyperlipidemia   . LBBB (left bundle branch block)   . Prediabetes   . Hyperplastic colon polyp   . Degenerative disc disease, thoracic   . Acid reflux   . Nonischemic cardiomyopathy ([place])   . CAD (coronary artery disease)     PCI to Circ with DES on 04/06/15  . Merkel cell cancer ([place]) 2006    s/p gluteal resection, lymph node dissection, chemo/ radiation  .  Shortness of breath dyspnea   . CHF (congestive heart failure) [facility])        Past Surgical History  Procedure Laterality Date  . Excision of merkel cell  2006  . Left knee surgery  1960  . Colonoscopy  2009  . Left and right cataract sugery    . Cardiac catheterization  04/06/2015  . Cardiac catheterization N/A 04/06/2015    Procedure: Right/Left Heart Cath and Coronary Angiography;  Surgeon: Larey Dresser, MD;  Location: [facility] [place] [facility];  Service: Cardiovascular;  Laterality: N/A;  . Cardiac catheterization N/A 04/06/2015    Procedure: Coronary Stent Intervention;  Surgeon: Peter M Martinique, MD;  Location: [place] [facility];  Service: Cardiovascular;  Laterality: N/A;   Allergies: Tape   Social History: The patient  reports that he quit smoking about 31 years ago. His smoking use included Cigarettes. He has a 3.75 pack-year smoking history. He has never used smokeless tobacco. He reports that he drinks alcohol. He reports that he does not use illicit drugs.   Family History: The patient's family history includes Cancer in his father and mother; Lupus in his mother; Stroke in his sister; Thyroid disease in his sister.   ROS: Please see the history of present illness. All other systems are reviewed and negative.   Current Outpatient Prescriptions  Medication Sig Dispense Refill  . aspirin 81 MG tablet Take 81 mg by mouth daily.    Marland Kitchen atorvastatin (LIPITOR) 80 MG tablet Take 1 tablet (80 mg total) by mouth daily at 6 PM. 30 tablet 5  . carvedilol (COREG)  3.125 MG tablet Take 3.125 mg by mouth 2 (two) times daily with a meal.    . clopidogrel (PLAVIX) 75 MG tablet Take 1 tablet (75 mg total) by mouth daily with breakfast. 30 tablet 11  . digoxin (LANOXIN) 0.125 MG tablet Take 1 tablet (0.125 mg total) by mouth daily. 30 tablet 5  . fluorouracil (EFUDEX) 5 % cream Apply 5 application topically daily as needed (Patches on face).   0  . furosemide  (LASIX) 20 MG tablet Take 20 mg by mouth daily.  0  . ibuprofen (ADVIL,MOTRIN) 200 MG tablet Take 200 mg by mouth 2 (two) times daily as needed (pain).    Marland Kitchen levothyroxine (SYNTHROID, LEVOTHROID) 50 MCG tablet Take 50 mcg by mouth daily.  5  . losartan (COZAAR) 25 MG tablet Take 1 tablet (25 mg total) by mouth daily. 90 tablet 3  . nitroGLYCERIN (NITROSTAT) 0.4 MG SL tablet Place 1 tablet (0.4 mg total) under the tongue every 5 (five) minutes as needed for chest pain. 25 tablet 2  . POTASSIUM PO Take 550 mg by mouth daily.    Marland Kitchen spironolactone (ALDACTONE) 25 MG tablet Take 0.5 tablets (12.5 mg total) by mouth daily. 15 tablet 3        Current medicines are reviewed at length with the patient today, he has not missed any doses.   PHYSICAL EXAM: VS: BP 109/69 mmHg  Pulse 90  Temp(Src) 97.6 F (36.4 C) (Oral)  Resp 18  Ht 5\' 11"  (1.803 m)  Wt 250 lb (113.399 kg)  BMI 34.88 kg/m2  SpO2 97%  GEN: overweight, in no acute distress  HEENT: normal  Neck: no JVD, carotid bruits, or masses Cardiac: RRR; no murmurs, rubs, or gallops, minimal R leg edema (chronic) Respiratory: clear to auscultation bilaterally, normal work of breathing GI: soft, nontender, nondistended, + BS MS: no deformity or atrophy  Skin: warm and dry  Neuro: Strength and sensation are intact Psych: euthymic mood, full affect  07/31/15: Labs are reviewed EKG: 07/10/15:  Showed sinus rhythm 83 bpm, PR 242, QRS 174 (LBBB), QTc491  ASSESSMENT AND PLAN:  1. The patient has a nonischemic CM (EF 20-25%), NYHA Class II/III CHF, LBBB, and CAD. At this time, he meets MADIT II/ SCD-HeFT criteria for ICD implantation for primary prevention of sudden death. He has a LBBB with QRS >150 msec and therefore also meets criteria for CRT. Risks, benefits, alternatives to BiV ICD implantation were discussed in detail with the patient at his last office visit and again today, he understands the risks  and wishes to proceed. We will therefore proceed with device implantation today.  Signed, Tommye Standard, PA-C

## 2015-08-05 ENCOUNTER — Ambulatory Visit (HOSPITAL_COMMUNITY): Payer: Medicare Other

## 2015-08-05 ENCOUNTER — Encounter (HOSPITAL_COMMUNITY): Payer: Medicare Other

## 2015-08-05 ENCOUNTER — Encounter (HOSPITAL_COMMUNITY): Payer: Self-pay | Admitting: Internal Medicine

## 2015-08-05 ENCOUNTER — Telehealth: Payer: Self-pay | Admitting: Physician Assistant

## 2015-08-05 DIAGNOSIS — I255 Ischemic cardiomyopathy: Secondary | ICD-10-CM | POA: Diagnosis not present

## 2015-08-05 DIAGNOSIS — I447 Left bundle-branch block, unspecified: Secondary | ICD-10-CM

## 2015-08-05 DIAGNOSIS — E785 Hyperlipidemia, unspecified: Secondary | ICD-10-CM | POA: Diagnosis not present

## 2015-08-05 DIAGNOSIS — I519 Heart disease, unspecified: Secondary | ICD-10-CM

## 2015-08-05 DIAGNOSIS — I5022 Chronic systolic (congestive) heart failure: Secondary | ICD-10-CM | POA: Diagnosis not present

## 2015-08-05 LAB — BASIC METABOLIC PANEL
Anion gap: 12 (ref 5–15)
BUN: 13 mg/dL (ref 6–20)
CALCIUM: 8.7 mg/dL — AB (ref 8.9–10.3)
CHLORIDE: 101 mmol/L (ref 101–111)
CO2: 25 mmol/L (ref 22–32)
CREATININE: 1.07 mg/dL (ref 0.61–1.24)
GFR calc Af Amer: >60 mL/min (ref >60)
GFR calc non Af Amer: >60 mL/min (ref >60)
GLUCOSE: 116 mg/dL — AB (ref 65–99)
Potassium: 3.7 mmol/L (ref 3.5–5.1)
Sodium: 138 mmol/L (ref 135–145)

## 2015-08-05 MED ORDER — BENZONATATE 100 MG PO CAPS: 100.0000 mg | ORAL_CAPSULE | Freq: Three times a day (TID) | ORAL | Status: DC | PRN

## 2015-08-05 MED ORDER — CETIRIZINE HCL 10 MG PO TABS: 10.0000 mg | ORAL_TABLET | Freq: Every day | ORAL | Status: DC

## 2015-08-05 MED ORDER — CETIRIZINE HCL 10 MG PO TABS
10.0000 mg | ORAL_TABLET | Freq: Once | ORAL | Status: AC
  Administered 2015-08-05: 11:00:00 10 mg via ORAL
  Filled 2015-08-05: qty 1

## 2015-08-05 NOTE — Telephone Encounter (Signed)
The patient was discharged today after CRTD placement yesterday. Patient is dripping blood from wound site since 4pm after he slept on wound site. Patient did took plavix this morning. Case discussed with Dr. Claiborne Billings and Chanetta Marshall, NP. Adviced patient to put a dressing with some pressure. Patient dose have a dressing supplies at home. If continues to bleed, Please go to ER.  Patient will f/u in clinic tomorrow. The patient denies CP, SOB, dizziness, syncope or elevated temperature.  The patient understand the plan and will apply new dressing with some pressure.    Travonne Schowalter, [place]

## 2015-08-06 ENCOUNTER — Encounter: Payer: Self-pay | Admitting: Internal Medicine

## 2015-08-06 ENCOUNTER — Ambulatory Visit (INDEPENDENT_AMBULATORY_CARE_PROVIDER_SITE_OTHER): Payer: Medicare Other | Admitting: Internal Medicine

## 2015-08-06 VITALS — BP 86/57 | HR 96 | Ht 71.0 in | Wt 256.0 lb

## 2015-08-06 DIAGNOSIS — I420 Dilated cardiomyopathy: Secondary | ICD-10-CM

## 2015-08-06 DIAGNOSIS — I429 Cardiomyopathy, unspecified: Secondary | ICD-10-CM

## 2015-08-06 MED ORDER — CEPHALEXIN 500 MG PO CAPS: ORAL_CAPSULE | ORAL | Status: DC

## 2015-08-07 ENCOUNTER — Encounter (HOSPITAL_COMMUNITY): Payer: Medicare Other

## 2015-08-09 NOTE — Progress Notes (Signed)
Patient Care Team: Orpah Melter, MD as PCP - General (Family Medicine)   HPI  [name] is a 74 y.o. male Seen as an add one today because of bleeding from his incision from CRT undertaken this week He is on DAPT HE UNDERWENT DES 6 /16  Records and Results Reviewed CATH RECORDS**  Past Medical History  Diagnosis Date  . Obesity   . Hyperlipidemia   . LBBB (left bundle branch block)   . Hyperplastic colon polyp   . Degenerative disc disease, thoracic   . Acid reflux   . Nonischemic cardiomyopathy ([place])   . CAD (coronary artery disease)     PCI to Circ with DES on 04/06/15  . Shortness of breath dyspnea   . CHF (congestive heart failure) ([place])   . Merkel cell cancer ([place]) 05/2005; 07-2005    s/p gluteal resection, lymph node dissection, chemo/ radiation  . AICD (automatic cardioverter/defibrillator) present     Past Surgical History  Procedure Laterality Date  . Skin cancer excision  05/2005; 07/2005    excision of merkel cell  . Knee cartilage surgery Left 1960  . Colonoscopy  2009  . Cataract extraction w/ intraocular lens  implant, bilateral Bilateral 11/2014  . Cardiac catheterization N/A 04/06/2015    Procedure: Right/Left Heart Cath and Coronary Angiography;  Surgeon: Larey Dresser, MD;  Location: [place];  Service: Cardiovascular;  Laterality: N/A;  . Cardiac catheterization N/A 04/06/2015    Procedure: Coronary Stent Intervention;  Surgeon: Peter M Martinique, MD;  Location: [facility];  Service: Cardiovascular;  Laterality: N/A;  . Bi-ventricular implantable cardioverter defibrillator  (crt-d)  08/04/2015  . Tonsillectomy    . Ep implantable device N/A 08/04/2015    Procedure: BiV ICD Insertion CRT-D;  Surgeon: [name], MD;  Location: [place] [facility];  Service: Cardiovascular;  Laterality: N/A;    Current Outpatient Prescriptions  Medication Sig Dispense Refill  . aspirin 81 MG tablet Take 81 mg by mouth daily.    Marland Kitchen  atorvastatin (LIPITOR) 80 MG tablet Take 1 tablet (80 mg total) by mouth daily at 6 PM. 30 tablet 5  . benzonatate (TESSALON PERLES) 100 MG capsule Take 1 capsule (100 mg total) by mouth 3 (three) times daily as needed for cough. 20 capsule 0  . carvedilol (COREG) 3.125 MG tablet Take 3.125 mg by mouth 2 (two) times daily with a meal.    . cephALEXin (KEFLEX) 500 MG capsule Take two capsules (1000 mg) by mouth every 12 hours for 7 days 28 capsule 0  . cetirizine (ZYRTEC ALLERGY) 10 MG tablet Take 1 tablet (10 mg total) by mouth daily. 30 tablet 1  . clopidogrel (PLAVIX) 75 MG tablet Take 1 tablet (75 mg total) by mouth daily with breakfast. 30 tablet 11  . digoxin (LANOXIN) 0.125 MG tablet Take 1 tablet (0.125 mg total) by mouth daily. 30 tablet 5  . fluorouracil (EFUDEX) 5 % cream Apply 5 application topically daily as needed (Patches on face).   0  . furosemide (LASIX) 20 MG tablet Take 20 mg by mouth daily.  0  . ibuprofen (ADVIL,MOTRIN) 200 MG tablet Take 200 mg by mouth 2 (two) times daily as needed (pain).    Marland Kitchen levothyroxine (SYNTHROID, LEVOTHROID) 50 MCG tablet Take 50 mcg by mouth daily.  5  . losartan (COZAAR) 25 MG tablet Take 1 tablet (25 mg total) by mouth daily. 90 tablet 3  . nitroGLYCERIN (  NITROSTAT) 0.4 MG SL tablet Place 1 tablet (0.4 mg total) under the tongue every 5 (five) minutes as needed for chest pain. 25 tablet 2  . POTASSIUM PO Take 550 mg by mouth daily as needed.     Marland Kitchen spironolactone (ALDACTONE) 25 MG tablet TAKE ONE HALF TABLETS (12.5 MG TOTAL) BY MOUTH DAILY. 15 tablet 2   No current facility-administered medications for this visit.   Facility-Administered Medications Ordered in Other Visits  Medication Dose Route Frequency Provider Last Rate Last Dose  . adenosine (diagnostic) (ADENOSCAN) infusion 68.4 mg  0.56 mg/kg Intravenous Once Lelon Perla, MD   68.4 mg at 11/11/14 1310    Allergies  Allergen Reactions  . Tape Other (See Comments)    Rash       Review of Systems negative except from HPI and PMH  Physical Exam BP 86/57 mmHg  Pulse 96  Ht 5\' 11"  (1.803 m)  Wt 256 lb (116.121 kg)  BMI 35.72 kg/m2 Well developed and well nourished in no acute distress HENT normal E scleral and icterus clear Neck Supple JVP flat; carotids brisk and full Clear to ausculation Small hematoma, but bleeding is evident through the incision into the bandage Regular rate and rhythm, no murmurs gallops or rub Soft with active bowel sounds No clubbing cyanosis   Edema Alert and oriented, grossly normal motor and sensory function Skin Warm and Dry    Assessment and  Plan  I appllied pressure to the wound for 15 minutes about and then placed a pressure dressing on the wound I spoke with Dr Shirlee More who reviewed the films and suggested would be ok to hold the ASA for about 5 days Rx w keflex To be seen next week in the clinic by Dr Greggory Brandy

## 2015-08-10 ENCOUNTER — Encounter (HOSPITAL_COMMUNITY): Payer: Medicare Other

## 2015-08-10 ENCOUNTER — Ambulatory Visit (INDEPENDENT_AMBULATORY_CARE_PROVIDER_SITE_OTHER): Payer: Medicare Other | Admitting: *Deleted

## 2015-08-10 ENCOUNTER — Telehealth: Payer: Self-pay | Admitting: *Deleted

## 2015-08-10 DIAGNOSIS — R718 Other abnormality of red blood cells: Secondary | ICD-10-CM

## 2015-08-10 DIAGNOSIS — I420 Dilated cardiomyopathy: Secondary | ICD-10-CM

## 2015-08-10 DIAGNOSIS — I429 Cardiomyopathy, unspecified: Secondary | ICD-10-CM

## 2015-08-10 NOTE — Telephone Encounter (Signed)
Notified the pt that per Dr Meda Coffee his labs indicated that he has normal BMP, but high MCV- and she recommends the pt have a B 12 and folate level checked.  Scheduled the pt a lab appt for 08/17/15 at our office, as pt requested this day, for he will be here anyway's for wound check at his defib site.  Pt verbalized understanding and agrees with this plan.

## 2015-08-10 NOTE — Telephone Encounter (Signed)
Notes Recorded by Dorothy Spark, MD on 08/01/2015 at 7:38 AM Normal BMP, high MCV - he should have B 12 level and folate level checked.

## 2015-08-10 NOTE — Progress Notes (Signed)
Wound recheck s/p CRT-D implant on 08/04/15. Patient was seen in the clinic by Dr.Klein on 10/13 due to a bleed from incision site. During this appt a pressure dressing was applied, patient's ASA was placed on hold, and Keflex was ordered.   Pressure dressing was removed today. No active bleeding, ecchymosis, or edema noted today.   Site evaluated by Dr.Allred. No changes made in current plan. Per Dr.Allred patient will need to follow up on 08/17/2015 for wound check as scheduled. Patient to call office if he notices any changes prior to this appointment.

## 2015-08-10 NOTE — Telephone Encounter (Signed)
-----   Message from Dalphine Handing, RN sent at 08/03/2015  5:50 PM EDT ----- Called patient about lab results. Patient stated he would be out for 1 1/2 weeks with his procedure tomorrow. Will forward to [facility], Dr. Francesca Oman nurse to schedule labs with patient later.

## 2015-08-12 ENCOUNTER — Encounter: Payer: Self-pay | Admitting: Internal Medicine

## 2015-08-12 ENCOUNTER — Ambulatory Visit: Payer: Medicare Other

## 2015-08-12 ENCOUNTER — Encounter (HOSPITAL_COMMUNITY): Payer: Medicare Other

## 2015-08-14 ENCOUNTER — Encounter (HOSPITAL_COMMUNITY): Payer: Medicare Other

## 2015-08-17 ENCOUNTER — Encounter: Payer: Self-pay | Admitting: Internal Medicine

## 2015-08-17 ENCOUNTER — Ambulatory Visit (HOSPITAL_BASED_OUTPATIENT_CLINIC_OR_DEPARTMENT_OTHER): Payer: Medicare Other | Attending: Pulmonary Disease

## 2015-08-17 ENCOUNTER — Other Ambulatory Visit (INDEPENDENT_AMBULATORY_CARE_PROVIDER_SITE_OTHER): Payer: Medicare Other | Admitting: *Deleted

## 2015-08-17 ENCOUNTER — Ambulatory Visit: Payer: Medicare Other

## 2015-08-17 ENCOUNTER — Ambulatory Visit (INDEPENDENT_AMBULATORY_CARE_PROVIDER_SITE_OTHER): Payer: Medicare Other | Admitting: *Deleted

## 2015-08-17 DIAGNOSIS — R0683 Snoring: Secondary | ICD-10-CM | POA: Diagnosis not present

## 2015-08-17 DIAGNOSIS — I519 Heart disease, unspecified: Secondary | ICD-10-CM | POA: Diagnosis not present

## 2015-08-17 DIAGNOSIS — R718 Other abnormality of red blood cells: Secondary | ICD-10-CM

## 2015-08-17 DIAGNOSIS — I429 Cardiomyopathy, unspecified: Secondary | ICD-10-CM

## 2015-08-17 DIAGNOSIS — I428 Other cardiomyopathies: Secondary | ICD-10-CM

## 2015-08-17 DIAGNOSIS — I447 Left bundle-branch block, unspecified: Secondary | ICD-10-CM

## 2015-08-17 DIAGNOSIS — I493 Ventricular premature depolarization: Secondary | ICD-10-CM | POA: Diagnosis not present

## 2015-08-17 DIAGNOSIS — I5022 Chronic systolic (congestive) heart failure: Secondary | ICD-10-CM | POA: Diagnosis not present

## 2015-08-17 DIAGNOSIS — G4736 Sleep related hypoventilation in conditions classified elsewhere: Secondary | ICD-10-CM | POA: Diagnosis not present

## 2015-08-17 DIAGNOSIS — G4733 Obstructive sleep apnea (adult) (pediatric): Secondary | ICD-10-CM | POA: Insufficient documentation

## 2015-08-17 DIAGNOSIS — G4719 Other hypersomnia: Secondary | ICD-10-CM | POA: Diagnosis not present

## 2015-08-17 LAB — CUP PACEART INCLINIC DEVICE CHECK
Battery Remaining Longevity: 82.8
Brady Statistic RV Percent Paced: 92 %
HIGH POWER IMPEDANCE MEASURED VALUE: 60 Ohm
HIGH POWER IMPEDANCE MEASURED VALUE: 64.125
Implantable Lead Implant Date: 20161011
Implantable Lead Location: 753860
Lead Channel Pacing Threshold Amplitude: 0.625 V
Lead Channel Pacing Threshold Amplitude: 0.75 V
Lead Channel Pacing Threshold Pulse Width: 0.5 ms
Lead Channel Pacing Threshold Pulse Width: 0.5 ms
Lead Channel Sensing Intrinsic Amplitude: 11.9 mV
Lead Channel Sensing Intrinsic Amplitude: 4.8 mV
Lead Channel Setting Pacing Amplitude: 2 V
Lead Channel Setting Pacing Amplitude: 3.5 V
Lead Channel Setting Pacing Pulse Width: 0.5 ms
Lead Channel Setting Sensing Sensitivity: 0.5 mV
MDC IDC LEAD IMPLANT DT: 20161011
MDC IDC LEAD IMPLANT DT: 20161011
MDC IDC LEAD LOCATION: 753858
MDC IDC LEAD LOCATION: 753860
MDC IDC MSMT LEADCHNL LV IMPEDANCE VALUE: 512.5 Ohm
MDC IDC MSMT LEADCHNL LV PACING THRESHOLD AMPLITUDE: 0.75 V
MDC IDC MSMT LEADCHNL RA IMPEDANCE VALUE: 450 Ohm
MDC IDC MSMT LEADCHNL RA PACING THRESHOLD AMPLITUDE: 0.75 V
MDC IDC MSMT LEADCHNL RA PACING THRESHOLD PULSEWIDTH: 0.5 ms
MDC IDC MSMT LEADCHNL RV IMPEDANCE VALUE: 675 Ohm
MDC IDC MSMT LEADCHNL RV PACING THRESHOLD PULSEWIDTH: 0.5 ms
MDC IDC PG SERIAL: 7276664
MDC IDC SESS DTM: 20161024163837
MDC IDC SET LEADCHNL LV PACING AMPLITUDE: 2 V
MDC IDC SET LEADCHNL RV PACING PULSEWIDTH: 0.5 ms
MDC IDC STAT BRADY RA PERCENT PACED: 0.21 %

## 2015-08-17 LAB — FOLATE: Folate: 8.4 ng/mL

## 2015-08-17 LAB — VITAMIN B12: Vitamin B-12: 256 pg/mL (ref 211–911)

## 2015-08-17 NOTE — Progress Notes (Signed)
CRT-D Wound check appointment. Steri-strips removed. Wound without redness or edema. Incision edges approximated, wound well healed. Normal device function. Thresholds, sensing, and impedances consistent with implant measurements. Device programmed at 3.5V for extra safety margin until 3 month visit. Histogram distribution appropriate for patient and level of activity. Pt BiVpacing 92%, PVC 2.1%. No mode switches or ventricular arrhythmias noted. Patient educated about wound care, arm mobility, lifting restrictions, shock plan. ROV w/ AS 09/21/15.

## 2015-08-18 ENCOUNTER — Telehealth: Payer: Self-pay | Admitting: Pulmonary Disease

## 2015-08-18 DIAGNOSIS — G4733 Obstructive sleep apnea (adult) (pediatric): Secondary | ICD-10-CM

## 2015-08-18 DIAGNOSIS — G4730 Sleep apnea, unspecified: Secondary | ICD-10-CM | POA: Diagnosis not present

## 2015-08-18 NOTE — Progress Notes (Signed)
Patient Name: [name], [name] Date: 08/17/2015 Gender: Male D.O.B: 06/06/1941 Age (years): 53 Referring Provider: Kara Mead MD, ABSM Height (inches): 71 Interpreting Physician: Kara Mead MD, ABSM Weight (lbs): 255 RPSGT: Madelon Lips BMI: 36 MRN: [id number] Neck Size: 18.50   CLINICAL INFORMATION The patient is referred for a split night study . He has nonischemic cardiomyopathy and excessive daytime fatigue   MEDICATIONS Medications taken by the patient : N/A Medications administered by patient during sleep study : No sleep medicine administered.   SLEEP STUDY TECHNIQUE As per the AASM Manual for the Scoring of Sleep and Associated Events v2.3 (April 2016) with a hypopnea requiring 4% desaturations. The channels recorded and monitored were frontal, central and occipital EEG, electrooculogram (EOG), submentalis EMG (chin), nasal and oral airflow, thoracic and abdominal wall motion, anterior tibialis EMG, snore microphone, electrocardiogram, and pulse oximetry. Bi-level positive airway pressure (BiPAP) was initiated when the patient met split night criteria and was titrated according to treat sleep-disordered breathing.   RESPIRATORY PARAMETERS Diagnostic Total AHI (/hr): 21.8 RDI (/hr): 23.1 OA Index (/hr): - CA Index (/hr): 0.4 REM AHI (/hr): 34.3 NREM AHI (/hr): 20.7 Supine AHI (/hr): 40.0 Non-supine AHI (/hr): 22.56 Min O2 Sat (%): 79.00 Mean O2 (%): 90.24 Time below 88% (min): 17.2     Titration - Optimal CPAP pressure could not be determined. Central apneas seemed to enlarge on a higher levels of CPAP. BiPAP of 18/14 was trialed but centrals persisted   SLEEP ARCHITECTURE The study was initiated at 10:12:39 PM and terminated at 5:10:50 AM. The total recorded time was 418.2 minutes. EEG confirmed total sleep time was 287.4 minutes yielding a sleep efficiency of 68.7%. Sleep onset after lights out was 14.3 minutes with a REM latency of 81.5 minutes. The patient spent  19.79% of the night in stage N1 sleep, 67.86% in stage N2 sleep, 0.00% in stage N3 and 12.35% in REM. Wake after sleep onset (WASO) was 116.5 minutes. The Arousal Index was 18.4/hour.   LEG MOVEMENT DATA The total Periodic Limb Movements of Sleep (PLMS) were 304. The PLMS index was 63.47 .   CARDIAC DATA The 2 lead EKG demonstrated pacemaker generated. The mean heart rate was 76.64 beats per minute. Other EKG findings include: PVCs.   IMPRESSIONS - Moderate obstructive sleep apnea occurred during the diagnostic portion of the study (AHI = 21.8 /hour). An optimal CPAP or BiPAP pressure could not be selected for this patient based on the available study data. - Central sleep apnea Was not significant during the diagnostic portion of the study (CAI = 0.4/hour) But emerged during titration- suggestive of complex sleep apnea - Severe oxygen desaturation was noted during the diagnostic portion of the study (Min O2 = 79.00%). - The patient snored with Loud snoring volume during the diagnostic portion of the study. - EKG findings include PVCs. - Severe periodic limb movements of sleep occurred during the study.   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 Monitor download to see if central apneas disappear with time. If not, then may need BiPAP titration with backup rate. ASV would be contraindicated in this patient with cardiomyopathy - Avoid alcohol, sedatives and other CNS depressants that may worsen sleep apnea and disrupt normal sleep architecture. - Sleep hygiene should be reviewed to assess factors that may improve sleep quality. - Weight management and regular exercise should be initiated or continued. - Return  for  re-evaluation.  Kara Mead MD. [facility]. [facility]

## 2015-08-18 NOTE — Telephone Encounter (Signed)
Patient notified. Patient scheduled for follow up Nothing further needed.

## 2015-08-18 NOTE — Telephone Encounter (Signed)
Moderate OSA 21/hour was noted. Central apneas emerged on CPAP titration Send prescription for- Auto CPAP 10-18 cm , medium fullface mask, humidity, download in 4 weeks Please arrange office visit with me in 6 weeks

## 2015-09-01 ENCOUNTER — Telehealth: Payer: Self-pay | Admitting: Pulmonary Disease

## 2015-09-01 ENCOUNTER — Encounter: Payer: Self-pay | Admitting: *Deleted

## 2015-09-01 ENCOUNTER — Encounter: Payer: Self-pay | Admitting: Cardiology

## 2015-09-01 NOTE — Telephone Encounter (Signed)
Spoke with the pt to inform him that Dr Meda Coffee reviewed his mychart concern from earlier this morning, and she has written the pt a letter to inform his insurance company, as to what the need for cardiac rehab is for this pt.  Informed the pt that I had our billing dept provide 2 contact numbers that he can call, to appeal for coverage of his cardiac rehab, to [facility].  Informed the pt that I had our medical records representative print off every office note the pt has had in our office with Dr Meda Coffee and other MD's up to the point where he was first referred to cardiac rehab back in 11/2014.  Advised the pt that he should contact cardiac rehab at [facility] to request that they print off any supporting documentation to help in aiding with this appeal.  Informed the pt that the forms are at the front desk in a manilla envelope, waiting for his spouse to pick up.  Pt verbalized understanding, agrees with this plan, and very gracious for all the assistance provided.

## 2015-09-01 NOTE — Telephone Encounter (Signed)
Please see if this patient needs an appointment

## 2015-09-01 NOTE — Telephone Encounter (Signed)
Jeani Hawking says that patient got our offices mixed up today, patient was supposed to have gone to their office but he arrived at our [place] office instead.  Jeani Hawking contacted the patient today and scheduled an appointment for him to come back on Thursday morning, 09/03/2015 at 8am.  Patient has been notified and verbalized understanding.  Nothing further needed.. Closing encounter.

## 2015-09-01 NOTE — Telephone Encounter (Signed)
Patient called [facility] office and spoke with Santiago Glad regarding appointment.  Patient had forgotten that he scheduled OV on 10/01/15 and arrived today thinking he had an appointment today.  I advised patient that his appointment was scheduled for 10/01/15 and that he needs to bring his SD card with him when he arrives. Patient advised me that he has not received his CPAP machine.  Patient said he has not heard anything from [facility] about his machine.  I called [facility] and they were all out to lunch, left message with answering service to have someone from [facility] to contact me to advise me on the status of patient's CPAP machine.   Awaiting call back from [facility] Will hold until complete

## 2015-09-01 NOTE — Telephone Encounter (Signed)
Patient has an appointment scheduled on 10/01/15 with Dr. Elsworth Soho for CPAP follow up.

## 2015-09-01 NOTE — Telephone Encounter (Signed)
Jeani Hawking returning call.Hillery Hunter

## 2015-09-01 NOTE — Telephone Encounter (Signed)
Lynn from [facility] called requesting to speak to [facility]

## 2015-09-01 NOTE — Telephone Encounter (Signed)
Called [facility] back and Jeani Hawking was in a meeting.  Left message with receptionist to have Jeani Hawking return my call.

## 2015-09-11 ENCOUNTER — Other Ambulatory Visit: Payer: Self-pay | Admitting: Internal Medicine

## 2015-09-20 ENCOUNTER — Other Ambulatory Visit: Payer: Self-pay | Admitting: Cardiology

## 2015-09-20 ENCOUNTER — Encounter: Payer: Self-pay | Admitting: Nurse Practitioner

## 2015-09-20 NOTE — Progress Notes (Signed)
This encounter was created in error - please disregard.

## 2015-09-21 ENCOUNTER — Encounter: Payer: Medicare Other | Admitting: Internal Medicine

## 2015-09-21 ENCOUNTER — Encounter: Payer: Medicare Other | Admitting: Nurse Practitioner

## 2015-09-23 ENCOUNTER — Other Ambulatory Visit: Payer: Self-pay | Admitting: Cardiology

## 2015-09-23 NOTE — Telephone Encounter (Signed)
Rx request sent to pharmacy.  

## 2015-09-25 ENCOUNTER — Telehealth: Payer: Self-pay | Admitting: Cardiology

## 2015-09-25 NOTE — Telephone Encounter (Signed)
Appeal request for 2d 2016 Outpt Cardiac Rehab mailed w/Dr. Francesca Oman Letter 09-14-15 to [facility], Appeals Dept [address], [place] [address]

## 2015-09-30 NOTE — Progress Notes (Signed)
Electrophysiology Office Note Date: 10/01/2015  ID:  Marcus, Christensen Jun 03, 1941, MRN [id number]  PCP: Orpah Melter, MD Primary Cardiologist: Meda Coffee Electrophysiologist: Allred  CC: 6 week CRTD CHF follow-up  [name] is a 74 y.o. male seen today for Dr Rayann Heman.  He presents today for post hospital electrophysiology followup.  Since discharge, the patient reports doing very well. He feels that he is slightly improved s/p CRTD implant. He denies chest pain, palpitations, dyspnea, PND, orthopnea, nausea, vomiting, dizziness, syncope, edema, weight gain, or early satiety.  He has not had ICD shocks.   Device History: STJ CRTD implanted 2016 for ICM, CHF, LBBB History of appropriate therapy: No History of AAD therapy: No   Past Medical History  Diagnosis Date  . Obesity   . Hyperlipidemia   . LBBB (left bundle branch block)   . Hyperplastic colon polyp   . Degenerative disc disease, thoracic   . Acid reflux   . Nonischemic cardiomyopathy ([place])     a. s/p STJ CRTD 07/2015  . CAD (coronary artery disease)     PCI to Circ with DES on 04/06/15  . CHF (congestive heart failure) ([place])   . 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 Skin cancer excision  05/2005; 07/2005    excision of merkel cell  . Knee cartilage surgery Left 1960  . Colonoscopy  2009  . Cataract extraction w/ intraocular lens  implant, bilateral Bilateral 11/2014  . Cardiac catheterization N/A 04/06/2015    Procedure: Right/Left Heart Cath and Coronary Angiography;  Surgeon: Larey Dresser, MD;  Location: [place] [facility];  Service: Cardiovascular;  Laterality: N/A;  . Cardiac catheterization N/A 04/06/2015    Procedure: Coronary Stent Intervention;  Surgeon: Peter M Martinique, MD;  Location: [place] [facility];  Service: Cardiovascular;  Laterality: N/A;  . Tonsillectomy    . Ep  implantable device N/A 08/04/2015    STJ CRTD implanted by Dr Rayann Heman for primary prevention/CHF    Current Outpatient Prescriptions  Medication Sig Dispense Refill  . acetaminophen (TYLENOL) 325 MG tablet Take 325 mg by mouth as needed.    Marland Kitchen aspirin 81 MG tablet Take 81 mg by mouth daily.    Marland Kitchen atorvastatin (LIPITOR) 80 MG tablet TAKE 1 TABLET BY MOUTH DAILY AT 6PM 30 tablet 2  . carvedilol (COREG) 3.125 MG tablet TAKE 2 TABLETS BY MOUTH TWICE A DAY 120 tablet 3  . clopidogrel (PLAVIX) 75 MG tablet Take 1 tablet (75 mg total) by mouth daily with breakfast. 30 tablet 11  . digoxin (LANOXIN) 0.125 MG tablet TAKE 1 TABLET BY MOUTH EVERY DAY 30 tablet 2  . fluorouracil (EFUDEX) 5 % cream Apply 5 application topically daily as needed (Patches on face).   0  . furosemide (LASIX) 20 MG tablet Take 20 mg by mouth daily.  0  . ibuprofen (ADVIL,MOTRIN) 200 MG tablet Take 200 mg by mouth 2 (two) times daily as needed (pain).    Marland Kitchen levothyroxine (SYNTHROID, LEVOTHROID) 50 MCG tablet Take 50 mcg by mouth daily.  5  . losartan (COZAAR) 25 MG tablet Take 1 tablet (25 mg total) by mouth daily. 90 tablet 3  . nitroGLYCERIN (NITROSTAT) 0.4 MG SL tablet Place 1 tablet (0.4 mg total) under the tongue every 5 (five) minutes as needed for chest pain. 25 tablet 2  . POTASSIUM PO  Take 550 mg by mouth daily as needed.     Marland Kitchen spironolactone (ALDACTONE) 25 MG tablet TAKE ONE HALF TABLETS (12.5 MG TOTAL) BY MOUTH DAILY. 15 tablet 2   No current facility-administered medications for this visit.   Facility-Administered Medications Ordered in Other Visits  Medication Dose Route Frequency Provider Last Rate Last Dose  . adenosine (diagnostic) (ADENOSCAN) infusion 68.4 mg  0.56 mg/kg Intravenous Once Lelon Perla, MD   68.4 mg at 11/11/14 1310    Allergies:   Tape   Social History: Social History   Social History  . Marital Status: Married    Spouse Name: bridgette  . Number of Children: 2  . Years of  Education: college   Occupational History  . [employer]    Social History Main Topics  . Smoking status: Former Smoker -- 0.25 packs/day for 15 years    Types: Cigarettes    Quit date: 10/25/1983  . Smokeless tobacco: Never Used  . Alcohol Use: 0.0 oz/week    0 Standard drinks or equivalent per week     Comment: 08/04/2015 "I doubt if I average a glass of wine and 1 beer q week"  . Drug Use: No  . Sexual Activity: Not Currently   Other Topics Concern  . Not on file   Social History Narrative   Pt lives in [place], married x 45 years.  2 grown sons have HTN.   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 Cancer Father     prostate  . Cancer Mother     breast  . Lupus Mother   . Stroke Sister   . Thyroid disease Sister   . Heart attack Neg Hx   . Hypertension Neg Hx     Review of Systems: All other systems reviewed and are otherwise negative except as noted above.   Physical Exam: VS:  BP 132/68 mmHg  Pulse 76  Ht 5\' 11"  (1.803 m)  Wt 255 lb 6.4 oz (115.849 kg)  BMI 35.64 kg/m2 , BMI Body mass index is 35.64 kg/(m^2).  GEN- The patient is obese appearing, alert and oriented x 3 today.   HEENT: normocephalic, atraumatic; sclera clear, conjunctiva pink; hearing intact; oropharynx clear; neck supple Lungs- Clear to ausculation bilaterally, normal work of breathing.  No wheezes, rales, rhonchi Heart- Regular rate and rhythm (paced) GI- soft, non-tender, non-distended, bowel sounds present Extremities- no clubbing, cyanosis, or edema; DP/PT/radial pulses 2+ bilaterally MS- no significant deformity or atrophy Skin- warm and dry, no rash or lesion; ICD pocket well healed Psych- euthymic mood, full affect Neuro- strength and sensation are intact  ICD interrogation- reviewed in detail today,  See PACEART report  EKG:  EKG is ordered today. The ekg ordered today shows sinus  rhythm with V pacing  Recent Labs: 11/25/2014: Pro B Natriuretic peptide (BNP) 403.0*; TSH 5.19* 05/29/2015: ALT 20 07/31/2015: Hemoglobin 12.2*; Platelets 159 08/05/2015: BUN 13; Creatinine, Ser 1.07; Potassium 3.7; Sodium 138   Wt Readings from Last 3 Encounters:  10/01/15 255 lb 6.4 oz (115.849 kg)  08/17/15 255 lb (115.667 kg)  08/06/15 256 lb (116.121 kg)     Other studies Reviewed: Additional studies/ records that were reviewed today include: Dr Jackalyn Lombard office notes, hospital records  Assessment and Plan:  1.  Chronic systolic dysfunction euvolemic today Stable on an appropriate medical regimen Normal CRTD function - he has had intermittent diaphragmatic  stim - LV output reduced today See Pace Art report No changes today Repeat echo 10/2015 to assess response to CRT BMET today Enroll in [facility]  2.  Obesity Weight loss advised Body mass index is 35.64 kg/(m^2).  Current medicines are reviewed at length with the patient today.   The patient does not have concerns regarding his medicines.  The following changes were made today:  none  Labs/ tests ordered today include: BMET   Disposition:   Follow up with Dr Rayann Heman 6 weeks, ICM clinic, Dr Meda Coffee as scheduled   Signed, Chanetta Marshall, NP 10/01/2015 8:49 AM  [place] [address] [place] [address] [phone] (office) [phone] (fax)

## 2015-10-01 ENCOUNTER — Encounter: Payer: Self-pay | Admitting: Pulmonary Disease

## 2015-10-01 ENCOUNTER — Ambulatory Visit (INDEPENDENT_AMBULATORY_CARE_PROVIDER_SITE_OTHER): Payer: Medicare Other | Admitting: Pulmonary Disease

## 2015-10-01 ENCOUNTER — Encounter: Payer: Self-pay | Admitting: Nurse Practitioner

## 2015-10-01 ENCOUNTER — Ambulatory Visit (INDEPENDENT_AMBULATORY_CARE_PROVIDER_SITE_OTHER): Payer: Medicare Other | Admitting: Nurse Practitioner

## 2015-10-01 VITALS — BP 114/74 | HR 79 | Ht 71.0 in | Wt 255.0 lb

## 2015-10-01 VITALS — BP 132/68 | HR 76 | Ht 71.0 in | Wt 255.4 lb

## 2015-10-01 DIAGNOSIS — Z6835 Body mass index (BMI) 35.0-35.9, adult: Secondary | ICD-10-CM | POA: Diagnosis not present

## 2015-10-01 DIAGNOSIS — G4733 Obstructive sleep apnea (adult) (pediatric): Secondary | ICD-10-CM

## 2015-10-01 DIAGNOSIS — I5022 Chronic systolic (congestive) heart failure: Secondary | ICD-10-CM

## 2015-10-01 LAB — CUP PACEART INCLINIC DEVICE CHECK
Date Time Interrogation Session: 20161208085452
Implantable Lead Implant Date: 20161011
Implantable Lead Implant Date: 20161011
Lead Channel Setting Pacing Amplitude: 2 V
Lead Channel Setting Pacing Amplitude: 3.5 V
Lead Channel Setting Sensing Sensitivity: 0.5 mV
MDC IDC LEAD IMPLANT DT: 20161011
MDC IDC LEAD LOCATION: 753858
MDC IDC LEAD LOCATION: 753860
MDC IDC LEAD LOCATION: 753860
MDC IDC SET LEADCHNL LV PACING PULSEWIDTH: 0.5 ms
MDC IDC SET LEADCHNL RV PACING AMPLITUDE: 2 V
MDC IDC SET LEADCHNL RV PACING PULSEWIDTH: 0.5 ms
Pulse Gen Serial Number: 7276664

## 2015-10-01 LAB — BASIC METABOLIC PANEL
BUN: 16 mg/dL (ref 7–25)
CALCIUM: 9.4 mg/dL (ref 8.6–10.3)
CHLORIDE: 104 mmol/L (ref 98–110)
CO2: 26 mmol/L (ref 20–31)
CREATININE: 1.03 mg/dL (ref 0.70–1.18)
Glucose, Bld: 111 mg/dL — ABNORMAL HIGH (ref 65–99)
Potassium: 4.3 mmol/L (ref 3.5–5.3)
Sodium: 140 mmol/L (ref 135–146)

## 2015-10-01 NOTE — Progress Notes (Signed)
   Subjective:    Patient ID: [name], male    DOB: [birth year], 74 y.o.   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 He has a history of chronic systolic CHF and LBBB s/p AICD echo in 4/16 showed EF 10-15%, stent in 03/2015 , plan is for rpt echo in 3 months and then to evaluate for resynchronization therapy. He is undergoing cardiac rehabilitation. 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 He smoked less than 10 pack years before he quit in 1985   10/01/2015  Chief Complaint  Patient presents with  . Sleep Apnea    Doing well on CPAP machine.  Some leakage with mask.      Moderate OSA 21/hour was noted. Central apneas emerged on CPAP titration 08/2015 placed on Auto CPAP 10-18 cm , medium fullface mask, humidity   download 09/2015 >> auto 10 -18,  Average pressure 15, residual AHI 6/hour, no leak, excellent compliance   Feels better rested, less sleepy in the daytime, has more energy  No snoring per wife   Doing well from a cardiac standpoint   Review of Systems neg for any significant sore throat, dysphagia, itching, sneezing, nasal congestion or excess/ purulent secretions, fever, chills, sweats, unintended wt loss, pleuritic or exertional cp, hempoptysis, orthopnea pnd or change in chronic leg swelling. Also denies presyncope, palpitations, heartburn, abdominal pain, nausea, vomiting, diarrhea or change in bowel or urinary habits, dysuria,hematuria, rash, arthralgias, visual complaints, headache, numbness weakness or ataxia.     Objective:   Physical Exam  Gen. Pleasant, obese, in no distress ENT - no lesions, no post nasal drip Neck: No JVD, no thyromegaly, no carotid bruits Lungs: no use of accessory muscles, no dullness to percussion, decreased without rales or rhonchi  Cardiovascular: Rhythm regular, heart  sounds  normal, no murmurs or gallops, no peripheral edema Musculoskeletal: No deformities, no cyanosis or clubbing , no tremors        Assessment & Plan:

## 2015-10-01 NOTE — Patient Instructions (Addendum)
Medication Instructions:   Your physician recommends that you continue on your current medications as directed. Please refer to the Current Medication list given to you today.   If you need a refill on your cardiac medications before your next appointment, please call your pharmacy.  Labwork: BMET    Testing/Procedures:  NONE ORDER TODAY    Follow-Up:     Any Other Special Instructions Will Be Listed Below (If Applicable).

## 2015-10-01 NOTE — Assessment & Plan Note (Signed)
moderate obstructive sleep apnea  Well controlled on auto CPAP-  Average pressure of 15 cm  Weight loss encouraged, compliance with goal of at least 4-6 hrs every night is the expectation. Advised against medications with sedative side effects Cautioned against driving when sleepy - understanding that sleepiness will vary on a day to day basis

## 2015-10-01 NOTE — Patient Instructions (Signed)
You have moderate obstructive sleep apnea  Well controlled on auto CPAP-  Average pressure of 15 cm

## 2015-10-05 ENCOUNTER — Telehealth: Payer: Self-pay | Admitting: *Deleted

## 2015-10-05 NOTE — Telephone Encounter (Signed)
-----   Message from Patsey Berthold, NP sent at 10/02/2015  4:55 AM EST ----- Please notify patient of stable labs. No med changes now. Thank you

## 2015-10-05 NOTE — Addendum Note (Signed)
Addended by: Freada Bergeron on: 10/05/2015 03:31 PM   Modules accepted: Orders

## 2015-10-06 ENCOUNTER — Telehealth: Payer: Self-pay

## 2015-10-06 NOTE — Telephone Encounter (Signed)
ICM introduction call to patient.  Explained ICM program and patient is reluctant to agree due to billing.  He stated his insurance company rejected the claim for pacemaker and his wife is working with insurance to get the claim approved.  He questioned remote follow up.  Explained transmission will be reviewed by device clinic every 91 days and for ICM it would be every 31 days.  Explained monthly fee for remote transmission and it should be covered by insurance.    He stated he would like to find out if he could if it would be covered before agreeing to monthly ICM calls.  Advised will discuss with Chanetta Marshall, NP and will give him a follow up call.     Patient referred to [facility] clinic by Chanetta Marshall, NP follow office visit 10/01/2015.

## 2015-10-07 NOTE — Telephone Encounter (Signed)
Received call from patients wife, DPR on file.  She stated patient did not have a good understanding of the monthly device follow up.  Explained program and she stated they needed to make sure that it would be covered by insurance.  Advised that patient has appointment with Dr Rayann Heman on 11/11/2015 and can discuss the program at that time.  Advised I would attempt to find out more information on insurance coverage.

## 2015-10-07 NOTE — Telephone Encounter (Signed)
Spoke with billing department and Code [phone] should be covered by insurance.  It is best to have patient or wife to call the insurance company with this code to determine if there is a copay and to confirm it is covered by their policy.

## 2015-10-23 ENCOUNTER — Encounter: Payer: Self-pay | Admitting: Internal Medicine

## 2015-10-29 ENCOUNTER — Telehealth: Payer: Self-pay | Admitting: Pulmonary Disease

## 2015-10-29 NOTE — Telephone Encounter (Signed)
Compliance Report results.  Per RA: AutoCPAP - Average 15cm Effective, no centrals. Good usage.  AHI: 6.4

## 2015-10-30 NOTE — Telephone Encounter (Signed)
Spoke with pt, aware of results/recs.  Nothing further needed.  

## 2015-11-04 ENCOUNTER — Encounter: Payer: Self-pay | Admitting: Pulmonary Disease

## 2015-11-06 NOTE — Telephone Encounter (Signed)
Spoke with wife.  Advised the best way to check if the monthly remote transmission is covered is to provide the insurance company with the billing code [phone].  I offered to give the information to her today but she declined information due to she was busy. She stated she would get the information at patients appointment with Dr Rayann Heman on 11/11/2015.  They will speak with Dr Rayann Heman regarding the ICM monthly monitoring to determine if they should participate.

## 2015-11-09 ENCOUNTER — Ambulatory Visit: Payer: Medicare Other | Admitting: Internal Medicine

## 2015-11-09 ENCOUNTER — Other Ambulatory Visit (HOSPITAL_COMMUNITY): Payer: Medicare Other

## 2015-11-11 ENCOUNTER — Other Ambulatory Visit: Payer: Self-pay

## 2015-11-11 ENCOUNTER — Ambulatory Visit (INDEPENDENT_AMBULATORY_CARE_PROVIDER_SITE_OTHER): Payer: Medicare Other | Admitting: Internal Medicine

## 2015-11-11 ENCOUNTER — Other Ambulatory Visit: Payer: Self-pay | Admitting: Cardiology

## 2015-11-11 ENCOUNTER — Encounter: Payer: Self-pay | Admitting: Internal Medicine

## 2015-11-11 ENCOUNTER — Ambulatory Visit (HOSPITAL_COMMUNITY): Payer: Medicare Other | Attending: Cardiovascular Disease

## 2015-11-11 VITALS — BP 118/72 | HR 79 | Ht 71.0 in | Wt 260.0 lb

## 2015-11-11 DIAGNOSIS — E785 Hyperlipidemia, unspecified: Secondary | ICD-10-CM | POA: Insufficient documentation

## 2015-11-11 DIAGNOSIS — I429 Cardiomyopathy, unspecified: Secondary | ICD-10-CM

## 2015-11-11 DIAGNOSIS — Z6835 Body mass index (BMI) 35.0-35.9, adult: Secondary | ICD-10-CM | POA: Diagnosis not present

## 2015-11-11 DIAGNOSIS — I517 Cardiomegaly: Secondary | ICD-10-CM | POA: Diagnosis not present

## 2015-11-11 DIAGNOSIS — I420 Dilated cardiomyopathy: Secondary | ICD-10-CM

## 2015-11-11 DIAGNOSIS — I5022 Chronic systolic (congestive) heart failure: Secondary | ICD-10-CM

## 2015-11-11 DIAGNOSIS — E669 Obesity, unspecified: Secondary | ICD-10-CM | POA: Insufficient documentation

## 2015-11-11 DIAGNOSIS — I447 Left bundle-branch block, unspecified: Secondary | ICD-10-CM | POA: Diagnosis not present

## 2015-11-11 DIAGNOSIS — I428 Other cardiomyopathies: Secondary | ICD-10-CM

## 2015-11-11 DIAGNOSIS — I519 Heart disease, unspecified: Secondary | ICD-10-CM | POA: Diagnosis not present

## 2015-11-11 LAB — CUP PACEART INCLINIC DEVICE CHECK
Battery Remaining Longevity: 84 mo
Brady Statistic RA Percent Paced: 1.2 %
Brady Statistic RV Percent Paced: 91 %
Date Time Interrogation Session: 20170118105819
HighPow Impedance: 66 Ohm
Implantable Lead Implant Date: 20161011
Implantable Lead Implant Date: 20161011
Implantable Lead Location: 753860
Lead Channel Impedance Value: 450 Ohm
Lead Channel Impedance Value: 637.5 Ohm
Lead Channel Pacing Threshold Amplitude: 0.5 V
Lead Channel Pacing Threshold Amplitude: 0.5 V
Lead Channel Pacing Threshold Amplitude: 0.75 V
Lead Channel Pacing Threshold Amplitude: 0.75 V
Lead Channel Pacing Threshold Amplitude: 0.75 V
Lead Channel Pacing Threshold Pulse Width: 0.5 ms
Lead Channel Pacing Threshold Pulse Width: 0.5 ms
Lead Channel Pacing Threshold Pulse Width: 0.5 ms
Lead Channel Setting Pacing Amplitude: 1.5 V
Lead Channel Setting Pacing Amplitude: 2 V
Lead Channel Setting Pacing Pulse Width: 0.5 ms
MDC IDC LEAD IMPLANT DT: 20161011
MDC IDC LEAD LOCATION: 753858
MDC IDC LEAD LOCATION: 753860
MDC IDC MSMT LEADCHNL LV IMPEDANCE VALUE: 475 Ohm
MDC IDC MSMT LEADCHNL LV PACING THRESHOLD PULSEWIDTH: 0.5 ms
MDC IDC MSMT LEADCHNL RA PACING THRESHOLD AMPLITUDE: 0.75 V
MDC IDC MSMT LEADCHNL RA PACING THRESHOLD PULSEWIDTH: 0.5 ms
MDC IDC MSMT LEADCHNL RA SENSING INTR AMPL: 3.6 mV
MDC IDC MSMT LEADCHNL RV PACING THRESHOLD PULSEWIDTH: 0.5 ms
MDC IDC MSMT LEADCHNL RV SENSING INTR AMPL: 11.9 mV
MDC IDC SET LEADCHNL RV PACING AMPLITUDE: 2 V
MDC IDC SET LEADCHNL RV PACING PULSEWIDTH: 0.5 ms
MDC IDC SET LEADCHNL RV SENSING SENSITIVITY: 0.5 mV
Pulse Gen Serial Number: 7276664

## 2015-11-11 NOTE — Progress Notes (Addendum)
Electrophysiology Office Note Date: 11/11/2015  ID:  Marcus, Christensen December 06, 1940, MRN [id number]  PCP: Orpah Melter, MD Primary Cardiologist: Meda Coffee Electrophysiologist: Rayann Heman  CC: CHF  [name] is a 75 y.o. male  presents today for post hospital electrophysiology followup.  Since BiV ICD implant, the patient reports doing very well.  He feels that his energy is "a little better". He denies chest pain, palpitations, dyspnea, PND, orthopnea, nausea, vomiting, dizziness, syncope, edema, weight gain, or early satiety.  He has not had ICD shocks.   Device History: STJ CRTD implanted 2016 for ICM, CHF, LBBB History of appropriate therapy: No History of AAD therapy: No   Past Medical History  Diagnosis Date  . Obesity   . Hyperlipidemia   . LBBB (left bundle branch block)   . Hyperplastic colon polyp   . Degenerative disc disease, thoracic   . Acid reflux   . Nonischemic cardiomyopathy ([place])     a. s/p STJ CRTD 07/2015  . CAD (coronary artery disease)     PCI to Circ with DES on 04/06/15  . CHF (congestive heart failure) ([place])   . 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 Skin cancer excision  05/2005; 07/2005    excision of merkel cell  . Knee cartilage surgery Left 1960  . Colonoscopy  2009  . Cataract extraction w/ intraocular lens  implant, bilateral Bilateral 11/2014  . Cardiac catheterization N/A 04/06/2015    Procedure: Right/Left Heart Cath and Coronary Angiography;  Surgeon: Larey Dresser, MD;  Location: [facility];  Service: Cardiovascular;  Laterality: N/A;  . Cardiac catheterization N/A 04/06/2015    Procedure: Coronary Stent Intervention;  Surgeon: Peter M Martinique, MD;  Location: [facility];  Service: Cardiovascular;  Laterality: N/A;  . Tonsillectomy    . Ep implantable device N/A 08/04/2015    STJ CRTD implanted  by Dr Rayann Heman for primary prevention/CHF    Current Outpatient Prescriptions  Medication Sig Dispense Refill  . acetaminophen (TYLENOL) 325 MG tablet Take 325 mg by mouth every 6 (six) hours as needed (pain).     Marland Kitchen aspirin 81 MG tablet Take 81 mg by mouth daily.    Marland Kitchen atorvastatin (LIPITOR) 80 MG tablet TAKE 1 TABLET BY MOUTH DAILY AT 6PM 30 tablet 2  . carvedilol (COREG) 3.125 MG tablet TAKE 2 TABLETS BY MOUTH TWICE A DAY 120 tablet 3  . clopidogrel (PLAVIX) 75 MG tablet Take 1 tablet (75 mg total) by mouth daily with breakfast. 30 tablet 11  . digoxin (LANOXIN) 0.125 MG tablet TAKE 1 TABLET BY MOUTH EVERY DAY 30 tablet 2  . fluorouracil (EFUDEX) 5 % cream Apply 5 application topically daily as needed (Patches on face).   0  . furosemide (LASIX) 20 MG tablet Take 20 mg by mouth daily.  0  . ibuprofen (ADVIL,MOTRIN) 200 MG tablet Take 200 mg by mouth 2 (two) times daily as needed (pain).    Marland Kitchen levothyroxine (SYNTHROID, LEVOTHROID) 50 MCG tablet Take 50 mcg by mouth daily.  5  . losartan (COZAAR) 25 MG tablet Take 1 tablet (25 mg total) by mouth daily. 90 tablet 3  . nitroGLYCERIN (NITROSTAT) 0.4 MG SL tablet Place 1 tablet (0.4 mg total) under the tongue every 5 (five) minutes as needed for chest pain. 25 tablet 2  . POTASSIUM PO Take 550  mg by mouth daily as needed (Potassium supplement).     Marland Kitchen spironolactone (ALDACTONE) 25 MG tablet TAKE ONE HALF TABLETS (12.5 MG TOTAL) BY MOUTH DAILY. 15 tablet 2   No current facility-administered medications for this visit.   Facility-Administered Medications Ordered in Other Visits  Medication Dose Route Frequency Provider Last Rate Last Dose  . adenosine (diagnostic) (ADENOSCAN) infusion 68.4 mg  0.56 mg/kg Intravenous Once Lelon Perla, MD   68.4 mg at 11/11/14 1310    Allergies:   Tape   Social History: Social History   Social History  . Marital Status: Married    Spouse Name: bridgette  . Number of Children: 2  . Years of Education:  college   Occupational History  . [employer]    Social History Main Topics  . Smoking status: Former Smoker -- 0.25 packs/day for 15 years    Types: Cigarettes    Quit date: 10/25/1983  . Smokeless tobacco: Never Used  . Alcohol Use: 0.0 oz/week    0 Standard drinks or equivalent per week     Comment: 08/04/2015 "I doubt if I average a glass of wine and 1 beer q week"  . Drug Use: No  . Sexual Activity: Not Currently   Other Topics Concern  . Not on file   Social History Narrative   Pt lives in [place], married x 45 years.  2 grown sons have HTN.   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 Cancer Father     prostate  . Cancer Mother     breast  . Lupus Mother   . Stroke Sister   . Thyroid disease Sister   . Heart attack Neg Hx   . Hypertension Neg Hx     Review of Systems: All other systems reviewed and are otherwise negative except as noted above.   Physical Exam: VS:  BP 118/72 mmHg  Pulse 79  Ht 5\' 11"  (1.803 m)  Wt 260 lb (117.935 kg)  BMI 36.28 kg/m2 , BMI Body mass index is 36.28 kg/(m^2).  GEN- The patient is obese appearing, alert and oriented x 3 today.   HEENT: normocephalic, atraumatic; sclera clear, conjunctiva pink; hearing intact; oropharynx clear; neck supple Lungs- Clear to ausculation bilaterally, normal work of breathing.  No wheezes, rales, rhonchi Heart- Regular rate and rhythm (paced) GI- soft, non-tender, non-distended, bowel sounds present Extremities- no clubbing, cyanosis, or edema; DP/PT/radial pulses 2+ bilaterally MS- no significant deformity or atrophy Skin- warm and dry, no rash or lesion; ICD pocket well healed Psych- euthymic mood, full affect Neuro- strength and sensation are intact  ICD interrogation- reviewed in detail today,  See PACEART report  Recent Labs: 11/25/2014: Pro B Natriuretic peptide (BNP) 403.0*; TSH 5.19* 05/29/2015:  ALT 20 07/31/2015: Hemoglobin 12.2*; Platelets 159 10/01/2015: BUN 16; Creat 1.03; Potassium 4.3; Sodium 140   Wt Readings from Last 3 Encounters:  11/11/15 260 lb (117.935 kg)  11/11/15 260 lb (117.935 kg)  10/01/15 255 lb (115.667 kg)     Assessment and Plan:  1.  Chronic systolic dysfunction euvolemic today though coreview has been recently elevated Stable on an appropriate medical regimen Normal CRTD function - he has had no further diaphragmatic stim since output adjustment by Ms Lynnell Jude last visit See Claudia Desanctis Art report No changes today Enroll in [facility] Echo today is pending Return in 3 months for follow-up with EP NP.  At that time, will assess whether to turn multipoint pacing on.  This would be indicated if EF has has not improved and CHF symptoms persist despite 6 months of BiV pacing at that time.  2.  Obesity Weight loss advised Body mass index is 36.28 kg/(m^2).  Current medicines are reviewed at length with the patient today.   The patient does not have concerns regarding his medicines.  The following changes were made today:  none    Disposition:   Follow up with EP NP in 6 months [name] Follow-up with Dr Meda Coffee as scheduled   Signed, [name] MD  11/11/2015 11:04 AM  [facility] [address] [place] [address] [phone] (office) [phone] (fax)

## 2015-11-11 NOTE — Patient Instructions (Signed)
Medication Instructions:  Your physician recommends that you continue on your current medications as directed. Please refer to the Current Medication list given to you today.   Labwork: None ordered   Testing/Procedures: None ordered   Follow-Up:  Your physician wants you to follow-up in: 3 months with Chanetta Marshall, NP and 6 months with Dr Oswaldo Done will receive a reminder letter in the mail two months in advance. If you don't receive a letter, please call our office to schedule the follow-up appointment.     Any Other Special Instructions Will Be Listed Below (If Applicable).     If you need a refill on your cardiac medications before your next appointment, please call your pharmacy.

## 2015-11-13 ENCOUNTER — Telehealth: Payer: Self-pay

## 2015-11-13 NOTE — Telephone Encounter (Signed)
Late entry 11/12/2015.  Received call from patients wife, DPR on file.  She reported [employer] told her that a preauth is needed.  Explained preauth has not been required for any home monitoring.  She requested I get the monitoring preauthorized.  I stated I would need to speak with the billing department or preauth department regarding the code and [place].  She stated they will be out of town until until the 1st week of February.  She stated to contact her on her cell number [phone].

## 2015-11-13 NOTE — Progress Notes (Signed)
Late entry for 11/11/2015  Spoke with patient and wife in the office on 11/11/2015 at time of office visit with Dr Rayann Heman.  Dr Rayann Heman discussed benefits of ICM clinic and they agreed to follow up if the insurance will cover the cost. Provided billing code and encouraged to call their insurance company to determine if it would be covered.  She would call back after she speaks with insurance company.

## 2015-11-18 NOTE — Progress Notes (Signed)
See my note (separate).

## 2015-11-23 ENCOUNTER — Ambulatory Visit: Payer: Medicare Other | Admitting: Cardiology

## 2015-12-31 ENCOUNTER — Encounter: Payer: Self-pay | Admitting: Adult Health

## 2015-12-31 ENCOUNTER — Ambulatory Visit (INDEPENDENT_AMBULATORY_CARE_PROVIDER_SITE_OTHER): Payer: Medicare Other | Admitting: Adult Health

## 2015-12-31 VITALS — BP 104/84 | HR 79 | Temp 97.4°F | Ht 71.0 in | Wt 266.0 lb

## 2015-12-31 DIAGNOSIS — G4733 Obstructive sleep apnea (adult) (pediatric): Secondary | ICD-10-CM

## 2015-12-31 NOTE — Assessment & Plan Note (Signed)
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  Do not drive if sleepy.  Work on weight loss.  Follow up Dr. Elsworth Soho  In 6 months and As needed

## 2015-12-31 NOTE — Assessment & Plan Note (Signed)
Work on weight loss.  Follow up Dr. Elsworth Soho  In 6 months and As needed

## 2015-12-31 NOTE — Patient Instructions (Signed)
Wear CPAP At bedtime  For at least 4-6 hr each night Keep up good work .  Do not drive if sleepy.  Work on weight loss.  Follow up Dr. Elsworth Soho  In 6 months and As needed

## 2015-12-31 NOTE — Progress Notes (Signed)
Subjective:    Patient ID: [name], male    DOB: 12/17/1940, 75 y.o.   MRN: [id number]  HPI 75yo male former smoker  followed for severe OSA by Dr. Elsworth Soho  .  Has nonischemic CM , chronic systolic CHF (EF 123456) and LBBB s/p AICD.  Previous hx of chemo/radiation in 2007 for cancer in abd lymph nodes.   TEST  Sleep study 07/2015 AHI 21.8,  CPAP titration central emerged. Placed on auto CPAP 10-18cm  12/31/2015 Follow up :OSA  Pt returns for 3 month follow up for sleep apnea.  Pt says he is doing well on CPAP .  He is on auto set 10-18cmH20.  Download 2/6 -12/29/15 shows excellent compliance with AHI at 3.1, min leaks . Central events 0.9. , avg press 14, wearing ~7 hr each night  He feels rested and denies significant daytime sleepiness.  He denies chest pain , sob, edema or fever.  Still working fulltime.  Wife says not snoring.  Recent echo showed improvement with EF 35-40% (was 20-25%)   Past Medical History  Diagnosis Date  . Obesity   . Hyperlipidemia   . LBBB (left bundle branch block)   . Hyperplastic colon polyp   . Degenerative disc disease, thoracic   . Acid reflux   . Nonischemic cardiomyopathy ([place])     a. s/p STJ CRTD 07/2015  . CAD (coronary artery disease)     PCI to Circ with DES on 04/06/15  . CHF (congestive heart failure) ([place])   . 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 acetaminophen (TYLENOL) 325 MG tablet Take 325 mg by mouth every 6 (six) hours as needed (pain).   Marland Kitchen aspirin 81 MG tablet Take 81 mg by mouth daily.  Marland Kitchen atorvastatin (LIPITOR) 80 MG tablet TAKE 1 TABLET BY MOUTH DAILY AT 6PM  . carvedilol (COREG) 3.125 MG tablet TAKE 2 TABLETS BY MOUTH TWICE A DAY  . clopidogrel (PLAVIX) 75 MG tablet Take 1 tablet (75 mg total) by mouth daily with breakfast.  . digoxin (LANOXIN) 0.125 MG tablet TAKE 1 TABLET BY  MOUTH EVERY DAY  . fluorouracil (EFUDEX) 5 % cream Apply 5 application topically daily as needed (Patches on face).   . furosemide (LASIX) 20 MG tablet Take 20 mg by mouth daily.  Marland Kitchen ibuprofen (ADVIL,MOTRIN) 200 MG tablet Take 200 mg by mouth 2 (two) times daily as needed (pain).  Marland Kitchen levothyroxine (SYNTHROID, LEVOTHROID) 50 MCG tablet Take 50 mcg by mouth daily.  Marland Kitchen losartan (COZAAR) 25 MG tablet Take 1 tablet (25 mg total) by mouth daily.  . nitroGLYCERIN (NITROSTAT) 0.4 MG SL tablet Place 1 tablet (0.4 mg total) under the tongue every 5 (five) minutes as needed for chest pain.  Marland Kitchen POTASSIUM PO Take 550 mg by mouth daily as needed (Potassium supplement).   Marland Kitchen spironolactone (ALDACTONE) 25 MG tablet TAKE ONE HALF TABLETS (12.5 MG TOTAL) BY MOUTH DAILY.   Facility-Administered Encounter Medications as of 12/31/2015  Medication  . adenosine (diagnostic) (ADENOSCAN) infusion 68.4 mg     Review of Systems    Constitutional:   No  weight loss, night sweats,  Fevers, chills, fatigue, or  lassitude.  HEENT:   No headaches,  Difficulty swallowing,  Tooth/dental problems, or  Sore throat,  No sneezing, itching, ear ache, nasal congestion, post nasal drip,   CV:  No chest pain,  Orthopnea, PND, swelling in lower extremities, anasarca, dizziness, palpitations, syncope.   GI  No heartburn, indigestion, abdominal pain, nausea, vomiting, diarrhea, change in bowel habits, loss of appetite, bloody stools.   Resp: No shortness of breath with exertion or at rest.  No excess mucus, no productive cough,  No non-productive cough,  No coughing up of blood.  No change in color of mucus.  No wheezing.  No chest wall deformity  Skin: no rash or lesions.  GU: no dysuria, change in color of urine, no urgency or frequency.  No flank pain, no hematuria   MS:  No joint pain or swelling.  No decreased range of motion.  No back pain.  Psych:  No change in mood or affect. No depression or anxiety.  No  memory loss.       Objective:   Physical Exam  Filed Vitals:   12/31/15 0908  BP: 104/84  Pulse: 79  Temp: 97.4 F (36.3 C)    Body mass index is 37.12 kg/(m^2).    GEN: A/Ox3; pleasant , NAD, obese   HEENT:  [place]/AT,  EACs-clear, TMs-wnl, NOSE-clear, THROAT-clear, no lesions, no postnasal drip or exudate noted. Class 2-3 MP airway   NECK:  Supple w/ fair ROM; no JVD; normal carotid impulses w/o bruits; no thyromegaly or nodules palpated; no lymphadenopathy.  RESP  Clear  P & A; w/o, wheezes/ rales/ or rhonchi.no accessory muscle use, no dullness to percussion  CARD:  RRR, no m/r/g  , no peripheral edema, pulses intact, no cyanosis or clubbing.  GI:   Soft & nt; nml bowel sounds; no organomegaly or masses detected.  Musco: Warm bil, no deformities or joint swelling noted.   Neuro: alert, no focal deficits noted.    Skin: Warm, no lesions or rashes  Tammy Parrett NP-C  [facility]  12/31/2015       Assessment & Plan:

## 2016-01-04 ENCOUNTER — Other Ambulatory Visit: Payer: Self-pay | Admitting: Cardiology

## 2016-01-04 NOTE — Telephone Encounter (Signed)
REFILL 

## 2016-01-07 ENCOUNTER — Telehealth: Payer: Self-pay

## 2016-01-07 NOTE — Telephone Encounter (Signed)
Attempted ICM call to patients wife on cell phone number, [phone] regarding ICM enrollment.  Left message for return call.

## 2016-01-07 NOTE — Progress Notes (Signed)
Reviewed & agree with plan  

## 2016-01-08 NOTE — Telephone Encounter (Signed)
Spoke with wife regarding billing code for ICM as she requested.  Explained billing department checked verbally and the web site of his insurance plan and code [phone] does not require a prior auth.  Asked if she would like to have patient monitored on monthly basis for ICM follow and she stated yes.  Confirmed he has a home monitor.  Scheduled 1st ICM encounter for 01/28/2016.

## 2016-01-13 ENCOUNTER — Encounter: Payer: Self-pay | Admitting: Adult Health

## 2016-01-24 ENCOUNTER — Other Ambulatory Visit: Payer: Self-pay | Admitting: Cardiology

## 2016-01-27 ENCOUNTER — Encounter: Payer: Self-pay | Admitting: Cardiology

## 2016-01-27 NOTE — Telephone Encounter (Signed)
Will route this to the refill dept and prior auth Nurse to review and refill accordingly

## 2016-01-28 ENCOUNTER — Telehealth: Payer: Self-pay

## 2016-01-28 ENCOUNTER — Ambulatory Visit (INDEPENDENT_AMBULATORY_CARE_PROVIDER_SITE_OTHER): Payer: Medicare Other

## 2016-01-28 DIAGNOSIS — I5022 Chronic systolic (congestive) heart failure: Secondary | ICD-10-CM | POA: Diagnosis not present

## 2016-01-28 DIAGNOSIS — Z95810 Presence of automatic (implantable) cardiac defibrillator: Secondary | ICD-10-CM

## 2016-01-28 NOTE — Telephone Encounter (Signed)
Attempted call to wife and left voice mail stating no remote transmission received and to have patient send a manual transmission today.

## 2016-01-29 NOTE — Progress Notes (Signed)
EPIC Encounter for ICM Monitoring  Patient Name: [name] is a 75 y.o. male Date: 01/29/2016 Primary Care Physican: Orpah Melter, MD Primary Cardiologist: Meda Coffee Electrophysiologist: Allred Dry Weight: unknown  Bi-V Pacing 93%      In the past month, have you:  1. Gained more than 2 pounds in a day or more than 5 pounds in a week? no  2. Had changes in your medications (with verification of current medications)? no  3. Had more shortness of breath than is usual for you? no  4. Limited your activity because of shortness of breath? no  5. Not been able to sleep because of shortness of breath? no  6. Had increased swelling in your feet or ankles? no  7. Had symptoms of dehydration (dizziness, dry mouth, increased thirst, decreased urine output) no  8. Had changes in sodium restriction? no  9. Been compliant with medication? Yes   ICM trend: 3 month view for 01/29/2016   ICM trend: 1 year view for 01/29/2016   Follow-up plan: ICM clinic phone appointment on 03/02/2016.  1st ICM encounter.   Spoke with wife (DPR on file).   Thoracic impedance below reference line from 01/08/2016 to 01/11/2016, 01/14/2016 to 01/17/2016, 01/21/2016 to 01/25/2016 suggesting fluid accumulation and returned to reference line 01/25/2016.  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  He has his legs massaged and wrapped every 2 weeks.  Advised if he has an increase of fluid that is out of his norm, then it could be related to HF.   Reviewed fluid symptoms to report.  She stated the fluid accumulation may been related to when they were eating out at restaurants.   Education given to limit sodium intake to < 2000 mg and fluid intake to 64 oz daily.  Reviewed some food choices such as hot dogs and stated that would be high in sodium.  Encouraged to call for any fluid symptoms.  No changes today.    Wife had questions on how to use mychart for  medication refills and explained how to do med refills.     Copy of note sent to patient's primary care physician, primary cardiologist, and device following physician.  [name], RN, CCM 01/29/2016 10:47 AM

## 2016-01-29 NOTE — Telephone Encounter (Signed)
Received voice mail from wife stating unsure how to send manual transmission.    Attempted call back to wife and left message for return call.

## 2016-02-26 ENCOUNTER — Other Ambulatory Visit (HOSPITAL_COMMUNITY): Payer: Self-pay | Admitting: *Deleted

## 2016-02-26 MED ORDER — CARVEDILOL 3.125 MG PO TABS: 6.2500 mg | ORAL_TABLET | Freq: Two times a day (BID) | ORAL | Status: DC

## 2016-03-02 ENCOUNTER — Telehealth: Payer: Self-pay

## 2016-03-02 ENCOUNTER — Ambulatory Visit (INDEPENDENT_AMBULATORY_CARE_PROVIDER_SITE_OTHER): Payer: Medicare Other

## 2016-03-02 DIAGNOSIS — Z95810 Presence of automatic (implantable) cardiac defibrillator: Secondary | ICD-10-CM

## 2016-03-02 DIAGNOSIS — I5022 Chronic systolic (congestive) heart failure: Secondary | ICD-10-CM | POA: Diagnosis not present

## 2016-03-02 NOTE — Progress Notes (Signed)
EPIC Encounter for ICM Monitoring  Patient Name: [name] is a 75 y.o. male Date: 03/02/2016 Primary Care Physican: Orpah Melter, MD Primary Cardiologist: Meda Coffee Electrophysiologist: Allred Dry Weight: unknown   Bi-V Pacing 93%      In the past month, have you:  1. Gained more than 2 pounds in a day or more than 5 pounds in a week? N/A  2. Had changes in your medications (with verification of current medications)? n/a  3. Had more shortness of breath than is usual for you? n/a  4. Limited your activity because of shortness of breath? n/a  5. Not been able to sleep because of shortness of breath? n/a  6. Had increased swelling in your feet, ankles, legs or stomach area? n/a  7. Had symptoms of dehydration (dizziness, dry mouth, increased thirst, decreased urine output) n/a  8. Had changes in sodium restriction? n/a  9. Been compliant with medication? n/a  ICM trend: 3 month view for 03/02/2016   ICM trend: 1 year view for 03/02/2016   Follow-up plan: ICM clinic phone appointment 04/06/2016.  Attempted call to wife and left message for return call.  Transmission reviewed.    FLUID LEVELS:   [place] thoracic impedance decreased 02/12/2016 to 02/15/2016 suggesting fluid accumulation and returned to baseline 02/16/2016 suggesting stable fluid levels.       [name], RN, CCM 03/02/2016 1:50 PM

## 2016-03-02 NOTE — Telephone Encounter (Signed)
Follow Up:; ° ° °Returning your call. °

## 2016-03-02 NOTE — Telephone Encounter (Signed)
Remote ICM transmission received.  Attempted call to wife and left message for return call.   

## 2016-03-02 NOTE — Progress Notes (Signed)
Return call from wife.  Reviewed transmission and she stated he may have eaten out during the days he had some fluid accumulation in June.  They have joined the [facility] and exercising several days a week.  He is weighing daily and she is unsure of exact weight but knows it is <250lbs.  Advised to call for any fluid symptoms.  No med changes and is compliant with meds as prescribed.  Next transmission scheduled for 04/06/2016.

## 2016-03-03 NOTE — Telephone Encounter (Signed)
Spoke with wife

## 2016-04-02 ENCOUNTER — Other Ambulatory Visit: Payer: Self-pay | Admitting: Cardiology

## 2016-04-06 ENCOUNTER — Ambulatory Visit (INDEPENDENT_AMBULATORY_CARE_PROVIDER_SITE_OTHER): Payer: Medicare Other

## 2016-04-06 ENCOUNTER — Telehealth: Payer: Self-pay

## 2016-04-06 DIAGNOSIS — I5022 Chronic systolic (congestive) heart failure: Secondary | ICD-10-CM

## 2016-04-06 DIAGNOSIS — Z95810 Presence of automatic (implantable) cardiac defibrillator: Secondary | ICD-10-CM | POA: Diagnosis not present

## 2016-04-06 NOTE — Telephone Encounter (Signed)
Remote ICM transmission received.  Attempted call to spouse on cell [phone]) and home phone.  Left message for return call.

## 2016-04-06 NOTE — Progress Notes (Addendum)
EPIC Encounter for ICM Monitoring  Patient Name: [name] is a 75 y.o. male Date: 04/06/2016 Primary Care Physican: Orpah Melter, MD Primary Cardiologist: Meda Coffee Electrophysiologist: Allred Dry Weight: unknown   Bi-V Pacing 93%      In the past month, have you:  1. Gained more than 2 pounds in a day or more than 5 pounds in a week? Unknown, does not weigh  2. Had changes in your medications (with verification of current medications)? No  3. Had more shortness of breath than is usual for you? No  4. Limited your activity because of shortness of breath? No  5. Not been able to sleep because of shortness of breath? No  6. Had increased swelling in your feet or ankles? No  7. Had symptoms of dehydration (dizziness, dry mouth, increased thirst, decreased urine output) No  8. Had changes in sodium restriction? No  9. Been compliant with medication? Yes   ICM trend: 3 month view for 04/06/2016   ICM trend: 1 year view for 04/06/2016   Follow-up plan: ICM clinic phone appointment on 05/11/2016.  Spoke with wife.    Thoracic impedance below reference line from 03/27/2016 to 03/28/2016 and 04/01/2016 to 04/04/2016 suggesting fluid accumulation and returned to baseline 04/04/2016 suggesting stable fluid levels.  She stated patient has had some swelling in his legs during the time of decreased thoracic impedance.  She stated leg swelling has improved and he will have a lymphedema massage on Friday which will release more fluid.  Advised to call if he develops other fluid symptoms.    [name], RN, CCM 04/06/2016 11:56 AM

## 2016-05-04 ENCOUNTER — Ambulatory Visit (INDEPENDENT_AMBULATORY_CARE_PROVIDER_SITE_OTHER): Payer: Medicare Other | Admitting: Cardiology

## 2016-05-04 ENCOUNTER — Other Ambulatory Visit: Payer: Self-pay | Admitting: Cardiology

## 2016-05-04 ENCOUNTER — Encounter: Payer: Self-pay | Admitting: Cardiology

## 2016-05-04 VITALS — BP 120/80 | HR 80 | Ht 71.0 in | Wt 262.0 lb

## 2016-05-04 DIAGNOSIS — I5022 Chronic systolic (congestive) heart failure: Secondary | ICD-10-CM

## 2016-05-04 DIAGNOSIS — I2510 Atherosclerotic heart disease of native coronary artery without angina pectoris: Secondary | ICD-10-CM

## 2016-05-04 DIAGNOSIS — C4A9 Merkel cell carcinoma, unspecified: Secondary | ICD-10-CM | POA: Insufficient documentation

## 2016-05-04 DIAGNOSIS — I428 Other cardiomyopathies: Secondary | ICD-10-CM

## 2016-05-04 DIAGNOSIS — R05 Cough: Secondary | ICD-10-CM

## 2016-05-04 DIAGNOSIS — I429 Cardiomyopathy, unspecified: Secondary | ICD-10-CM

## 2016-05-04 DIAGNOSIS — E785 Hyperlipidemia, unspecified: Secondary | ICD-10-CM

## 2016-05-04 DIAGNOSIS — I519 Heart disease, unspecified: Secondary | ICD-10-CM

## 2016-05-04 DIAGNOSIS — Z95810 Presence of automatic (implantable) cardiac defibrillator: Secondary | ICD-10-CM

## 2016-05-04 DIAGNOSIS — R059 Cough, unspecified: Secondary | ICD-10-CM

## 2016-05-04 DIAGNOSIS — I447 Left bundle-branch block, unspecified: Secondary | ICD-10-CM

## 2016-05-04 LAB — LIPID PANEL
Cholesterol: 88 mg/dL — ABNORMAL LOW (ref 125–200)
HDL: 47 mg/dL (ref >=40)
LDL Cholesterol: 31 mg/dL (ref <130)
Total CHOL/HDL Ratio: 1.9 Ratio (ref <=5.0)
Triglycerides: 48 mg/dL (ref <150)
VLDL: 10 mg/dL (ref <30)

## 2016-05-04 LAB — CBC WITH DIFFERENTIAL/PLATELET
Basophils Absolute: 0 cells/uL (ref 0–200)
Basophils Relative: 0 %
Eosinophils Absolute: 135 cells/uL (ref 15–500)
Eosinophils Relative: 3 %
HCT: 38.4 % — ABNORMAL LOW (ref 38.5–50.0)
Hemoglobin: 13 g/dL — ABNORMAL LOW (ref 13.2–17.1)
Lymphocytes Relative: 16 %
Lymphs Abs: 720 cells/uL — ABNORMAL LOW (ref 850–3900)
MCH: 33.8 pg — ABNORMAL HIGH (ref 27.0–33.0)
MCHC: 33.9 g/dL (ref 32.0–36.0)
MCV: 99.7 fL (ref 80.0–100.0)
MPV: 11.8 fL (ref 7.5–12.5)
Monocytes Absolute: 540 cells/uL (ref 200–950)
Monocytes Relative: 12 %
Neutro Abs: 3105 cells/uL (ref 1500–7800)
Neutrophils Relative %: 69 %
Platelets: 161 10*3/uL (ref 140–400)
RBC: 3.85 MIL/uL — ABNORMAL LOW (ref 4.20–5.80)
RDW: 13.5 % (ref 11.0–15.0)
WBC: 4.5 10*3/uL (ref 3.8–10.8)

## 2016-05-04 LAB — COMPREHENSIVE METABOLIC PANEL
ALT: 19 U/L (ref 9–46)
AST: 17 U/L (ref 10–35)
Albumin: 4.2 g/dL (ref 3.6–5.1)
Alkaline Phosphatase: 53 U/L (ref 40–115)
BUN: 19 mg/dL (ref 7–25)
CO2: 25 mmol/L (ref 20–31)
Calcium: 9.1 mg/dL (ref 8.6–10.3)
Chloride: 104 mmol/L (ref 98–110)
Creat: 0.99 mg/dL (ref 0.70–1.18)
Glucose, Bld: 112 mg/dL — ABNORMAL HIGH (ref 65–99)
Potassium: 4.2 mmol/L (ref 3.5–5.3)
Sodium: 142 mmol/L (ref 135–146)
Total Bilirubin: 0.8 mg/dL (ref 0.2–1.2)
Total Protein: 6.5 g/dL (ref 6.1–8.1)

## 2016-05-04 MED ORDER — ATORVASTATIN CALCIUM 80 MG PO TABS: ORAL_TABLET | ORAL | Status: DC

## 2016-05-04 MED ORDER — FUROSEMIDE 20 MG PO TABS: 20.0000 mg | ORAL_TABLET | Freq: Every day | ORAL | Status: DC

## 2016-05-04 MED ORDER — NITROGLYCERIN 0.4 MG SL SUBL: 0.4000 mg | SUBLINGUAL_TABLET | SUBLINGUAL | Status: DC | PRN

## 2016-05-04 MED ORDER — LOSARTAN POTASSIUM 25 MG PO TABS: 25.0000 mg | ORAL_TABLET | Freq: Every day | ORAL | Status: DC

## 2016-05-04 MED ORDER — SPIRONOLACTONE 25 MG PO TABS: 12.5000 mg | ORAL_TABLET | Freq: Every day | ORAL | Status: DC

## 2016-05-04 NOTE — Patient Instructions (Signed)
Medication Instructions:   STOP TAKING PLAVIX NOW    Labwork:  TODAY--CMET, CBC W DIFF, AND LIPIDS    Testing/Procedures:  Your physician has requested that you have an echocardiogram. Echocardiography is a painless test that uses sound waves to create images of your heart. It provides your doctor with information about the size and shape of your heart and how well your heart's chambers and valves are working. This procedure takes approximately one hour. There are no restrictions for this procedure.    Follow-Up:  Your physician wants you to follow-up in: [place] will receive a reminder letter in the mail two months in advance. If you don't receive a letter, please call our office to schedule the follow-up appointment.      If you need a refill on your cardiac medications before your next appointment, please call your pharmacy.

## 2016-05-04 NOTE — Progress Notes (Signed)
Electrophysiology Office Note   Date:  05/04/2016   ID:  [name], [name] [birth year], MRN [id number]  PCP:  Orpah Melter, MD  Cardiologist:  Dr Meda Coffee Primary Electrophysiologist: Ena Dawley, MD    No chief complaint on file.    History of Present Illness: [name] is a 75 y.o. male who presents today for electrophysiology follow-up.    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  He reports that he began having symptoms of SOB and DOE 12/15.  His symptoms progressed.  He underwent echo 11/11/14 which revealed EF 25% with moderate MR and moderate LA enlargement.  He had a myoview 2/16 that showed no prior scar and no ischemia but dilated left ventricle with moderately to severely impaired systolic function.  He has been treated with an optimal medical regimen without improvement.  He has also recently had PCI 6/16.  Despite revascularization, echo 07/01/15 reveals EF 20-25%. He continues to have SOB with moderate activity.   He has stable LE lymphedema, controlled by a low dose lasix.     05/04/2016 - 9 months follow up, he underwent BiV ICD implantation in 07/2016 with improvement of his symptoms, having more energy. 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  Denies LE edema, some only when non-compliant to low salt diet, wears compression stockings, denies orthopnea, PND, and reports being out of energy only when he avoids going to the gym. No ICD shocks, some "palpitations" when laying on the left side, improved after ICD reprogramming.    Past Medical History  Diagnosis Date  . Obesity   . Hyperlipidemia   . LBBB (left bundle branch block)   . Hyperplastic colon polyp   . Degenerative disc disease, thoracic   . Acid reflux   . Nonischemic cardiomyopathy ([place])     a. s/p STJ CRTD 07/2015  .  CAD (coronary artery disease)     PCI to Circ with DES on 04/06/15  . CHF (congestive heart failure) ([place])   . Merkel cell cancer ([place]) 05/2005; 07-2005    s/p gluteal resection, lymph node dissection, chemo/ radiation   Past Surgical History  Procedure Laterality Date  . Skin cancer excision  05/2005; 07/2005    excision of merkel cell  . Knee cartilage surgery Left 1960  . Colonoscopy  2009  . Cataract extraction w/ intraocular lens  implant, bilateral Bilateral 11/2014  . Cardiac catheterization N/A 04/06/2015    Procedure: Right/Left Heart Cath and Coronary Angiography;  Surgeon: Larey Dresser, MD;  Location: [facility];  Service: Cardiovascular;  Laterality: N/A;  . Cardiac catheterization N/A 04/06/2015    Procedure: Coronary Stent Intervention;  Surgeon: Peter M Martinique, MD;  Location: [facility];  Service: Cardiovascular;  Laterality: N/A;  . Tonsillectomy    . Ep implantable device N/A 08/04/2015    STJ CRTD implanted by Dr Rayann Heman for primary prevention/CHF     Current Outpatient Prescriptions  Medication Sig Dispense Refill  . aspirin 81 MG tablet Take 81 mg by mouth daily.    Marland Kitchen atorvastatin (LIPITOR) 80 MG tablet TAKE 1 TABLET BY MOUTH DAILY AT 6:00 PM 90 tablet 6  . carvedilol (COREG) 3.125 MG tablet Take 2 tablets (6.25 mg total) by mouth 2 (two) times daily. 120 tablet 3  . fluorouracil (EFUDEX)  5 % cream Apply 5 application topically daily as needed (Patches on face).   0  . furosemide (LASIX) 20 MG tablet Take 1 tablet (20 mg total) by mouth daily. 90 tablet 6  . ibuprofen (ADVIL,MOTRIN) 200 MG tablet Take 200 mg by mouth 2 (two) times daily as needed (pain).    Marland Kitchen levothyroxine (SYNTHROID, LEVOTHROID) 50 MCG tablet Take 50 mcg by mouth daily.  5  . losartan (COZAAR) 25 MG tablet Take 1 tablet (25 mg total) by mouth daily. 90 tablet 6  . nitroGLYCERIN (NITROSTAT) 0.4 MG SL tablet Place 1 tablet (0.4 mg total) under the tongue every 5 (five) minutes as needed  for chest pain. 25 tablet 6  . POTASSIUM PO Take 550 mg by mouth daily as needed (Potassium supplement).     Marland Kitchen spironolactone (ALDACTONE) 25 MG tablet Take 0.5 tablets (12.5 mg total) by mouth daily. 135 tablet 6  . DIGOX 125 MCG tablet TAKE 1 TABLET BY MOUTH EVERY DAY 30 tablet 5   No current facility-administered medications for this visit.   Facility-Administered Medications Ordered in Other Visits  Medication Dose Route Frequency Provider Last Rate Last Dose  . adenosine (diagnostic) (ADENOSCAN) infusion 68.4 mg  0.56 mg/kg Intravenous Once Lelon Perla, MD   68.4 mg at 11/11/14 1310    Allergies:   Tape   Social History:  The patient  reports that he quit smoking about 32 years ago. His smoking use included Cigarettes. He has a 3.75 pack-year smoking history. He has never used smokeless tobacco. He reports that he drinks alcohol. He reports that he does not use illicit drugs.   Family History:  The patient's  family history includes Cancer in his father and mother; Lupus in his mother; Stroke in his sister; Thyroid disease in his sister. There is no history of Heart attack or Hypertension.    ROS:  Please see the history of present illness.   All other systems are reviewed and negative.    PHYSICAL EXAM: VS:  BP 120/80 mmHg  Pulse 80  Ht 5\' 11"  (1.803 m)  Wt 262 lb (118.842 kg)  BMI 36.56 kg/m2 , BMI Body mass index is 36.56 kg/(m^2). GEN: overweight, in no acute distress HEENT: normal Neck: no JVD, carotid bruits, or masses Cardiac: RRR; no murmurs, rubs, or gallops,R leg edema (chronic) Respiratory:  clear to auscultation bilaterally, normal work of breathing GI: soft, nontender, nondistended, + BS MS: no deformity or atrophy Skin: warm and dry  Neuro:  Strength and sensation are intact Psych: euthymic mood, full affect  EKG:  EKG is ordered today. The ekg ordered today shows sinus rhythm 83 bpm, PR 242, QRS 174 (LBBB), QTc491   Recent Labs: 05/29/2015: ALT  20 07/31/2015: Hemoglobin 12.2*; Platelets 159 10/01/2015: BUN 16; Creat 1.03; Potassium 4.3; Sodium 140    Lipid Panel     Component Value Date/Time   CHOL 114 11/25/2014 0747   TRIG 66.0 11/25/2014 0747   HDL 42.20 11/25/2014 0747   CHOLHDL 3 11/25/2014 0747   VLDL 13.2 11/25/2014 0747   LDLCALC 59 11/25/2014 0747     Wt Readings from Last 3 Encounters:  05/04/16 262 lb (118.842 kg)  12/31/15 266 lb (120.657 kg)  11/11/15 260 lb (117.935 kg)    TTE: 07/01/2015 Study Conclusions - Left ventricle: The cavity size was normal. Wall thickness was increased in a pattern of mild LVH. Systolic function was severely reduced. The estimated ejection fraction was in the range of  20% to 25%. There is akinesis of the anteroseptal and inferoseptal myocardium. - Mitral valve: Calcified annulus. There was mild regurgitation.  Impressions: - Since last echocardiogram, EF has mildly improved.   TTE: 10/2015 - Left ventricle: The cavity size was moderately dilated. There was  mild concentric hypertrophy. Systolic function was moderately  reduced. The estimated ejection fraction was in the range of 35%  to 40%. Hypokinesis of the basal-midinferolateral myocardium. - Ventricular septum: Septal motion showed abnormal function,  dyssynergy, and paradox. These changes are consistent with right  ventricular pacing. - Left atrium: The atrium was moderately to severely dilated.  Impressions: - 2D study only. Compared to the previous study, LVEF has improved.    ASSESSMENT AND PLAN:  1.  The patient has a nonischemic CM (EF 20-25%), NYHA Class IIa CHF, LBBB, and CAD. - s/p BiV ICD implantation in 10/17 with LVEF improvement to 35-40% in 10/2015, we will repeat echo now. - Appears euvolemic.  - continue Coreg 3.125 mg bid.  - discontinue lisinopril 5 mg daily  - start losartan 25 mg po daily - Continue lasix 20 mg po daily and spironolactone 12.5 mg daily  2. Obesity Weight  loss is advised  3. CAD -   LHC showed 90% stenosis of LCx with PCI and stenting in 03/2015, we will d/c Plavix as he has easy bruising.   4. LBBB - s/p BiV ICD implantation   5. Hyperlipidemia - on atorvastatin  Check CBC, CMP and lipids today.  Signed, Ena Dawley, MD  05/04/2016 2:24 PM

## 2016-05-11 ENCOUNTER — Ambulatory Visit (INDEPENDENT_AMBULATORY_CARE_PROVIDER_SITE_OTHER): Payer: Medicare Other

## 2016-05-11 DIAGNOSIS — Z95810 Presence of automatic (implantable) cardiac defibrillator: Secondary | ICD-10-CM

## 2016-05-11 DIAGNOSIS — I5022 Chronic systolic (congestive) heart failure: Secondary | ICD-10-CM

## 2016-05-11 NOTE — Progress Notes (Signed)
ICM call to wife.  She declined to continue ICM calls.  She reported that when patient has leg swelling it is only related to his lymphedema.  They feel it is not necessary to have fluid levels checked monthly.  Discontinue ICM calls.

## 2016-06-01 ENCOUNTER — Other Ambulatory Visit (HOSPITAL_COMMUNITY): Payer: Medicare Other

## 2016-06-08 ENCOUNTER — Other Ambulatory Visit: Payer: Self-pay

## 2016-06-08 ENCOUNTER — Ambulatory Visit (HOSPITAL_COMMUNITY): Payer: Medicare Other | Attending: Cardiology

## 2016-06-08 DIAGNOSIS — R05 Cough: Secondary | ICD-10-CM | POA: Diagnosis not present

## 2016-06-08 DIAGNOSIS — G4733 Obstructive sleep apnea (adult) (pediatric): Secondary | ICD-10-CM | POA: Insufficient documentation

## 2016-06-08 DIAGNOSIS — Z87891 Personal history of nicotine dependence: Secondary | ICD-10-CM | POA: Insufficient documentation

## 2016-06-08 DIAGNOSIS — Z6836 Body mass index (BMI) 36.0-36.9, adult: Secondary | ICD-10-CM | POA: Diagnosis not present

## 2016-06-08 DIAGNOSIS — I429 Cardiomyopathy, unspecified: Secondary | ICD-10-CM | POA: Diagnosis not present

## 2016-06-08 DIAGNOSIS — I071 Rheumatic tricuspid insufficiency: Secondary | ICD-10-CM | POA: Insufficient documentation

## 2016-06-08 DIAGNOSIS — R059 Cough, unspecified: Secondary | ICD-10-CM

## 2016-06-08 DIAGNOSIS — I340 Nonrheumatic mitral (valve) insufficiency: Secondary | ICD-10-CM | POA: Insufficient documentation

## 2016-06-08 DIAGNOSIS — I5022 Chronic systolic (congestive) heart failure: Secondary | ICD-10-CM | POA: Diagnosis not present

## 2016-06-08 DIAGNOSIS — I428 Other cardiomyopathies: Secondary | ICD-10-CM

## 2016-06-08 DIAGNOSIS — I517 Cardiomegaly: Secondary | ICD-10-CM | POA: Diagnosis not present

## 2016-06-08 DIAGNOSIS — I447 Left bundle-branch block, unspecified: Secondary | ICD-10-CM

## 2016-06-08 DIAGNOSIS — R29898 Other symptoms and signs involving the musculoskeletal system: Secondary | ICD-10-CM | POA: Insufficient documentation

## 2016-06-08 DIAGNOSIS — I2510 Atherosclerotic heart disease of native coronary artery without angina pectoris: Secondary | ICD-10-CM

## 2016-06-08 DIAGNOSIS — E785 Hyperlipidemia, unspecified: Secondary | ICD-10-CM | POA: Diagnosis not present

## 2016-06-08 DIAGNOSIS — I519 Heart disease, unspecified: Secondary | ICD-10-CM

## 2016-06-28 ENCOUNTER — Other Ambulatory Visit (HOSPITAL_COMMUNITY): Payer: Self-pay | Admitting: Internal Medicine

## 2016-07-21 ENCOUNTER — Ambulatory Visit (INDEPENDENT_AMBULATORY_CARE_PROVIDER_SITE_OTHER): Payer: Medicare Other | Admitting: Pulmonary Disease

## 2016-07-21 ENCOUNTER — Encounter: Payer: Self-pay | Admitting: Pulmonary Disease

## 2016-07-21 DIAGNOSIS — Z23 Encounter for immunization: Secondary | ICD-10-CM | POA: Diagnosis not present

## 2016-07-21 DIAGNOSIS — G4733 Obstructive sleep apnea (adult) (pediatric): Secondary | ICD-10-CM | POA: Diagnosis not present

## 2016-07-21 DIAGNOSIS — I429 Cardiomyopathy, unspecified: Secondary | ICD-10-CM

## 2016-07-21 DIAGNOSIS — I428 Other cardiomyopathies: Secondary | ICD-10-CM

## 2016-07-21 NOTE — Addendum Note (Signed)
Addended by: Mathis Dad on: 07/21/2016 05:06 PM   Modules accepted: Orders

## 2016-07-21 NOTE — Assessment & Plan Note (Signed)
CPAP supplies will be renewed for a year Okay to trial nasal mask with/without chinstrap  Weight loss encouraged, compliance with goal of at least 4-6 hrs every night is the expectation. Advised against medications with sedative side effects Cautioned against driving when sleepy - understanding that sleepiness will vary on a day to day basis

## 2016-07-21 NOTE — Patient Instructions (Signed)
CPAP supplies will be renewed for a year Okay to trial nasal mask with/without chinstrap

## 2016-07-21 NOTE — Progress Notes (Signed)
   Subjective:    Patient ID: [name], male    DOB: 05-11-1941, 75 y.o.   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 He has a history of chronic systolic CHF and LBBB s/p AICD . 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 He smoked less than 10 pack years before he quit in 1985  07/21/2016  Chief Complaint  Patient presents with  . Follow-up    doing well on CPAP machine. no concerns.   He has settled down on CPAP  PSG >> Moderate OSA 21/hour was noted. Central apneas emerged on CPAP titration 08/2015 placed on Auto CPAP 10-18 cm , medium fullface mask, humidity   download 06/2016 shows good control of events with average pressure of 14 cm and no leak.   Feels better rested, less sleepy in the daytime, has more energy  No snoring per wife, so ' it must be working'     Doing well from a cardiac standpoint-EF was as low as 15% is now improved to 35%    Review of Systems Patient denies significant dyspnea,cough, hemoptysis,  chest pain, palpitations, pedal edema, orthopnea, paroxysmal nocturnal dyspnea, lightheadedness, nausea, vomiting, abdominal or  leg pains       Objective:   Physical Exam  Gen. Pleasant, well-nourished, in no distress ENT - no lesions, no post nasal drip Neck: No JVD, no thyromegaly, no carotid bruits Lungs: no use of accessory muscles, no dullness to percussion, clear without rales or rhonchi  Cardiovascular: Rhythm regular, heart sounds  normal, no murmurs or gallops, no peripheral edema Musculoskeletal: No deformities, no cyanosis or clubbing        Assessment & Plan:

## 2016-07-21 NOTE — Assessment & Plan Note (Signed)
Benefits of CPAP for systolic heart failure and OSA discussed

## 2016-07-25 ENCOUNTER — Ambulatory Visit (INDEPENDENT_AMBULATORY_CARE_PROVIDER_SITE_OTHER): Payer: Medicare Other | Admitting: *Deleted

## 2016-07-25 DIAGNOSIS — I428 Other cardiomyopathies: Secondary | ICD-10-CM

## 2016-07-25 DIAGNOSIS — Z95810 Presence of automatic (implantable) cardiac defibrillator: Secondary | ICD-10-CM

## 2016-07-25 NOTE — Progress Notes (Signed)
Remote ICD transmission.   

## 2016-07-27 ENCOUNTER — Encounter: Payer: Self-pay | Admitting: Cardiology

## 2016-07-27 ENCOUNTER — Encounter: Payer: Self-pay | Admitting: Pulmonary Disease

## 2016-08-04 LAB — CUP PACEART REMOTE DEVICE CHECK
Battery Remaining Longevity: 73 mo
Battery Remaining Percentage: 86 %
Brady Statistic AP VS Percent: 1 %
Brady Statistic AS VP Percent: 90 %
Date Time Interrogation Session: 20170930070919
HIGH POWER IMPEDANCE MEASURED VALUE: 70 Ohm
HighPow Impedance: 70 Ohm
Implantable Lead Implant Date: 20161011
Implantable Lead Implant Date: 20161011
Implantable Lead Location: 753860
Implantable Lead Location: 753860
Lead Channel Impedance Value: 400 Ohm
Lead Channel Impedance Value: 560 Ohm
Lead Channel Pacing Threshold Amplitude: 0.75 V
Lead Channel Pacing Threshold Pulse Width: 0.5 ms
Lead Channel Setting Pacing Amplitude: 1.5 V
Lead Channel Setting Pacing Amplitude: 2 V
Lead Channel Setting Pacing Pulse Width: 0.5 ms
MDC IDC LEAD IMPLANT DT: 20161011
MDC IDC LEAD LOCATION: 753858
MDC IDC MSMT BATTERY VOLTAGE: 3.02 V
MDC IDC MSMT LEADCHNL LV PACING THRESHOLD AMPLITUDE: 0.75 V
MDC IDC MSMT LEADCHNL LV PACING THRESHOLD PULSEWIDTH: 0.5 ms
MDC IDC MSMT LEADCHNL RA IMPEDANCE VALUE: 390 Ohm
MDC IDC MSMT LEADCHNL RA SENSING INTR AMPL: 2.4 mV
MDC IDC MSMT LEADCHNL RV PACING THRESHOLD AMPLITUDE: 0.75 V
MDC IDC MSMT LEADCHNL RV PACING THRESHOLD PULSEWIDTH: 0.5 ms
MDC IDC MSMT LEADCHNL RV SENSING INTR AMPL: 11.9 mV
MDC IDC PG SERIAL: 7276664
MDC IDC SET LEADCHNL RV PACING AMPLITUDE: 2 V
MDC IDC SET LEADCHNL RV PACING PULSEWIDTH: 0.5 ms
MDC IDC SET LEADCHNL RV SENSING SENSITIVITY: 0.5 mV
MDC IDC STAT BRADY AP VP PERCENT: 3.3 %
MDC IDC STAT BRADY AS VS PERCENT: 4.2 %
MDC IDC STAT BRADY RA PERCENT PACED: 1.4 %

## 2016-08-10 ENCOUNTER — Encounter: Payer: Self-pay | Admitting: Cardiology

## 2016-10-26 ENCOUNTER — Ambulatory Visit (INDEPENDENT_AMBULATORY_CARE_PROVIDER_SITE_OTHER): Payer: Medicare HMO | Admitting: *Deleted

## 2016-10-26 DIAGNOSIS — I428 Other cardiomyopathies: Secondary | ICD-10-CM

## 2016-10-27 ENCOUNTER — Telehealth: Payer: Self-pay | Admitting: Cardiology

## 2016-10-27 NOTE — Telephone Encounter (Signed)
LMOVM reminding pt to send remote transmission.   

## 2016-10-28 ENCOUNTER — Encounter: Payer: Self-pay | Admitting: Cardiology

## 2016-10-28 NOTE — Progress Notes (Signed)
Remote ICD transmission.   

## 2016-11-03 ENCOUNTER — Ambulatory Visit: Payer: Medicare Other | Admitting: Cardiology

## 2016-11-11 ENCOUNTER — Other Ambulatory Visit: Payer: Self-pay

## 2016-11-11 ENCOUNTER — Telehealth: Payer: Self-pay | Admitting: Cardiology

## 2016-11-11 ENCOUNTER — Other Ambulatory Visit (HOSPITAL_COMMUNITY): Payer: Self-pay | Admitting: *Deleted

## 2016-11-11 MED ORDER — CARVEDILOL 3.125 MG PO TABS: ORAL_TABLET | ORAL | 3 refills | Status: DC

## 2016-11-11 NOTE — Telephone Encounter (Signed)
°*  STAT* If patient is at the pharmacy, call can be transferred to refill team.   1. Which medications need to be refilled? (please list name of each medication and dose if known) Carvedilol 3.125mg  ( needs a new prescription )   2. Which pharmacy/location (including street and city if local pharmacy) is medication to be sent to?[facility] on [address] in [place]  3. Do they need a 30 day or 90 day supply? [place]

## 2016-11-12 LAB — CUP PACEART REMOTE DEVICE CHECK
Battery Remaining Percentage: 83 %
Brady Statistic AP VS Percent: 1 %
Brady Statistic AS VP Percent: 88 %
HIGH POWER IMPEDANCE MEASURED VALUE: 71 Ohm
HighPow Impedance: 71 Ohm
Implantable Lead Implant Date: 20161011
Implantable Lead Location: 753860
Implantable Lead Location: 753860
Implantable Pulse Generator Implant Date: 20161011
Lead Channel Impedance Value: 400 Ohm
Lead Channel Impedance Value: 540 Ohm
Lead Channel Pacing Threshold Amplitude: 0.75 V
Lead Channel Pacing Threshold Pulse Width: 0.5 ms
Lead Channel Sensing Intrinsic Amplitude: 2.6 mV
Lead Channel Setting Pacing Amplitude: 1.5 V
Lead Channel Setting Pacing Amplitude: 2 V
Lead Channel Setting Pacing Pulse Width: 0.5 ms
Lead Channel Setting Pacing Pulse Width: 0.5 ms
MDC IDC LEAD IMPLANT DT: 20161011
MDC IDC LEAD IMPLANT DT: 20161011
MDC IDC LEAD LOCATION: 753858
MDC IDC MSMT BATTERY REMAINING LONGEVITY: 71 mo
MDC IDC MSMT BATTERY VOLTAGE: 2.99 V
MDC IDC MSMT LEADCHNL LV PACING THRESHOLD AMPLITUDE: 0.75 V
MDC IDC MSMT LEADCHNL LV PACING THRESHOLD PULSEWIDTH: 0.5 ms
MDC IDC MSMT LEADCHNL RV IMPEDANCE VALUE: 390 Ohm
MDC IDC MSMT LEADCHNL RV PACING THRESHOLD AMPLITUDE: 0.75 V
MDC IDC MSMT LEADCHNL RV PACING THRESHOLD PULSEWIDTH: 0.5 ms
MDC IDC MSMT LEADCHNL RV SENSING INTR AMPL: 11.9 mV
MDC IDC SESS DTM: 20180105194954
MDC IDC SET LEADCHNL RA PACING AMPLITUDE: 2 V
MDC IDC SET LEADCHNL RV SENSING SENSITIVITY: 0.5 mV
MDC IDC STAT BRADY AP VP PERCENT: 3.6 %
MDC IDC STAT BRADY AS VS PERCENT: 4.7 %
MDC IDC STAT BRADY RA PERCENT PACED: 1.3 %
Pulse Gen Serial Number: 7276664

## 2016-11-15 NOTE — Telephone Encounter (Signed)
I have no idea what this is about

## 2016-11-17 DIAGNOSIS — R05 Cough: Secondary | ICD-10-CM | POA: Diagnosis not present

## 2016-11-17 DIAGNOSIS — R0981 Nasal congestion: Secondary | ICD-10-CM | POA: Diagnosis not present

## 2016-11-17 DIAGNOSIS — J22 Unspecified acute lower respiratory infection: Secondary | ICD-10-CM | POA: Diagnosis not present

## 2016-12-14 ENCOUNTER — Encounter: Payer: Self-pay | Admitting: Cardiology

## 2016-12-14 ENCOUNTER — Ambulatory Visit (INDEPENDENT_AMBULATORY_CARE_PROVIDER_SITE_OTHER): Payer: Medicare HMO | Admitting: Cardiology

## 2016-12-14 VITALS — BP 90/60 | HR 84 | Ht 71.0 in | Wt 267.8 lb

## 2016-12-14 DIAGNOSIS — I2510 Atherosclerotic heart disease of native coronary artery without angina pectoris: Secondary | ICD-10-CM | POA: Diagnosis not present

## 2016-12-14 DIAGNOSIS — I447 Left bundle-branch block, unspecified: Secondary | ICD-10-CM | POA: Diagnosis not present

## 2016-12-14 DIAGNOSIS — Z95810 Presence of automatic (implantable) cardiac defibrillator: Secondary | ICD-10-CM

## 2016-12-14 DIAGNOSIS — I420 Dilated cardiomyopathy: Secondary | ICD-10-CM

## 2016-12-14 DIAGNOSIS — I428 Other cardiomyopathies: Secondary | ICD-10-CM

## 2016-12-14 DIAGNOSIS — I429 Cardiomyopathy, unspecified: Secondary | ICD-10-CM

## 2016-12-14 DIAGNOSIS — E782 Mixed hyperlipidemia: Secondary | ICD-10-CM

## 2016-12-14 DIAGNOSIS — I519 Heart disease, unspecified: Secondary | ICD-10-CM

## 2016-12-14 NOTE — Patient Instructions (Signed)
Medication Instructions:   Your physician recommends that you continue on your current medications as directed. Please refer to the Current Medication list given to you today.     Testing/Procedures:   Your physician has requested that you have an echocardiogram. Echocardiography is a painless test that uses sound waves to create images of your heart. It provides your doctor with information about the size and shape of your heart and how well your heart's chambers and valves are working. This procedure takes approximately one hour. There are no restrictions for this procedure.    Follow-Up:  3 MONTHS WITH DR NELSON       If you need a refill on your cardiac medications before your next appointment, please call your pharmacy.   

## 2016-12-14 NOTE — Progress Notes (Signed)
Electrophysiology Office Note   Date:  12/14/2016   ID:  Roxas, Sigers 1941/03/13, MRN [id number]  PCP:  Orpah Melter, MD  Cardiologist:  Dr Meda Coffee Primary Electrophysiologist: Ena Dawley, MD    Chief complain: post flu visit   History of Present Illness: [name] is a 76 y.o. male who presents today for electrophysiology follow-up.    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  He reports that he began having symptoms of SOB and DOE 12/15.  His symptoms progressed.  He underwent echo 11/11/14 which revealed EF 25% with moderate MR and moderate LA enlargement.  He had a myoview 2/16 that showed no prior scar and no ischemia but dilated left ventricle with moderately to severely impaired systolic function.  He has been treated with an optimal medical regimen without improvement.  He has also recently had PCI 6/16.  Despite revascularization, echo 07/01/15 reveals EF 20-25%. He continues to have SOB with moderate activity.   He has stable LE lymphedema, controlled by a low dose lasix.     05/04/2016 - 9 months follow up, he underwent BiV ICD implantation in 07/2016 with improvement of his symptoms, having more energy. 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  Denies LE edema, some only when non-compliant to low salt diet, wears compression stockings, denies orthopnea, PND, and reports being out of energy only when he avoids going to the gym. No ICD shocks, some "palpitations" when laying on the left side, improved after ICD reprogramming.   12/14/2016 - the patient is coming after 6 months, he's been doing well until 3 weeks ago when he developed flu that lead to an ER visit. He is slowly recovering. He denies any lower extremity edema orthopnea or paroxysmal nocturnal dyspnea. No palpitations or ICD  discharges. He also denies chest pain he feels very tired and has worsening dyspnea on exertion.  Past Medical History:  Diagnosis Date  . Acid reflux   . CAD (coronary artery disease)    PCI to Circ with DES on 04/06/15  . CHF (congestive heart failure) ([place])   . Degenerative disc disease, thoracic   . Hyperlipidemia   . Hyperplastic colon polyp   . LBBB (left bundle branch block)   . Merkel cell cancer ([place]) 05/2005; 07-2005   s/p gluteal resection, lymph node dissection, chemo/ radiation  . Nonischemic cardiomyopathy ([place])    a. s/p STJ CRTD 07/2015  . Obesity    Past Surgical History:  Procedure Laterality Date  . CARDIAC CATHETERIZATION N/A 04/06/2015   Procedure: Right/Left Heart Cath and Coronary Angiography;  Surgeon: Larey Dresser, MD;  Location: [facility];  Service: Cardiovascular;  Laterality: N/A;  . CARDIAC CATHETERIZATION N/A 04/06/2015   Procedure: Coronary Stent Intervention;  Surgeon: Peter M Martinique, MD;  Location: [facility];  Service: Cardiovascular;  Laterality: N/A;  . CATARACT EXTRACTION W/ INTRAOCULAR LENS  IMPLANT, BILATERAL Bilateral 11/2014  . COLONOSCOPY  2009  . EP IMPLANTABLE DEVICE N/A 08/04/2015   STJ CRTD implanted by Dr Rayann Heman for primary prevention/CHF  . KNEE CARTILAGE SURGERY Left 1960  . SKIN CANCER EXCISION  05/2005; 07/2005   excision of merkel cell  . TONSILLECTOMY       Current Outpatient Prescriptions  Medication Sig Dispense Refill  . aspirin 81 MG  tablet Take 81 mg by mouth daily.    Marland Kitchen atorvastatin (LIPITOR) 80 MG tablet TAKE 1 TABLET BY MOUTH DAILY AT 6:00 PM 90 tablet 6  . carvedilol (COREG) 3.125 MG tablet TAKE 2 TABLETS(6.25 MG) BY MOUTH TWICE DAILY 120 tablet 3  . DIGOX 125 MCG tablet TAKE 1 TABLET BY MOUTH EVERY DAY 30 tablet 5  . fluorouracil (EFUDEX) 5 % cream Apply 5 application topically daily as needed (Patches on face).   0  . furosemide (LASIX) 20 MG tablet Take 1 tablet (20 mg total) by mouth daily. 90  tablet 6  . ibuprofen (ADVIL,MOTRIN) 200 MG tablet Take 200 mg by mouth 2 (two) times daily as needed (pain).    Marland Kitchen levothyroxine (SYNTHROID, LEVOTHROID) 50 MCG tablet Take 50 mcg by mouth daily.  5  . losartan (COZAAR) 25 MG tablet Take 1 tablet (25 mg total) by mouth daily. 90 tablet 6  . nitroGLYCERIN (NITROSTAT) 0.4 MG SL tablet Place 1 tablet (0.4 mg total) under the tongue every 5 (five) minutes as needed for chest pain. 25 tablet 6  . POTASSIUM PO Take 550 mg by mouth daily as needed (Potassium supplement).     Marland Kitchen spironolactone (ALDACTONE) 25 MG tablet Take 0.5 tablets (12.5 mg total) by mouth daily. 135 tablet 6   No current facility-administered medications for this visit.    Facility-Administered Medications Ordered in Other Visits  Medication Dose Route Frequency Provider Last Rate Last Dose  . adenosine (diagnostic) (ADENOSCAN) infusion 68.4 mg  0.56 mg/kg Intravenous Once Lelon Perla, MD        Allergies:   Tape   Social History:  The patient  reports that he quit smoking about 33 years ago. His smoking use included Cigarettes. He has a 3.75 pack-year smoking history. He has never used smokeless tobacco. He reports that he drinks alcohol. He reports that he does not use drugs.   Family History:  The patient's  family history includes Cancer in his father and mother; Lupus in his mother; Stroke in his sister; Thyroid disease in his sister.    ROS:  Please see the history of present illness.   All other systems are reviewed and negative.    PHYSICAL EXAM: VS:  BP 90/60 (BP Location: Right Arm, Patient Position: Sitting, Cuff Size: Large)   Pulse 84   Ht 5\' 11"  (1.803 m)   Wt 267 lb 12.8 oz (121.5 kg)   SpO2 95%   BMI 37.35 kg/m  , BMI Body mass index is 37.35 kg/m. GEN: overweight, in no acute distress  HEENT: normal  Neck: no JVD, carotid bruits, or masses Cardiac: RRR; no murmurs, rubs, or gallops,R leg edema (chronic) Respiratory:  clear to auscultation  bilaterally, normal work of breathing GI: soft, nontender, nondistended, + BS MS: no deformity or atrophy  Skin: warm and dry  Neuro:  Strength and sensation are intact Psych: euthymic mood, full affect  EKG:  EKG is ordered today. The ekg ordered today shows sinus rhythm 83 bpm, PR 242, QRS 174 (LBBB), QTc491   Recent Labs: 05/04/2016: ALT 19; BUN 19; Creat 0.99; Hemoglobin 13.0; Platelets 161; Potassium 4.2; Sodium 142    Lipid Panel     Component Value Date/Time   CHOL 88 (L) 05/04/2016 1001   TRIG 48 05/04/2016 1001   HDL 47 05/04/2016 1001   CHOLHDL 1.9 05/04/2016 1001   VLDL 10 05/04/2016 1001   LDLCALC 31 05/04/2016 1001     Wt Readings from  Last 3 Encounters:  12/14/16 267 lb 12.8 oz (121.5 kg)  07/21/16 268 lb (121.6 kg)  05/04/16 262 lb (118.8 kg)    TTE: 07/01/2015 Study Conclusions - Left ventricle: The cavity size was normal. Wall thickness was increased in a pattern of mild LVH. Systolic function was severely reduced. The estimated ejection fraction was in the range of 20% to 25%. There is akinesis of the anteroseptal and inferoseptal myocardium. - Mitral valve: Calcified annulus. There was mild regurgitation.  Impressions: - Since last echocardiogram, EF has mildly improved.   TTE: 10/2015 - Left ventricle: The cavity size was moderately dilated. There was  mild concentric hypertrophy. Systolic function was moderately  reduced. The estimated ejection fraction was in the range of 35%  to 40%. Hypokinesis of the basal-midinferolateral myocardium. - Ventricular septum: Septal motion showed abnormal function,  dyssynergy, and paradox. These changes are consistent with right  ventricular pacing. - Left atrium: The atrium was moderately to severely dilated.  Impressions: - 2D study only. Compared to the previous study, LVEF has improved.    ASSESSMENT AND PLAN:  1.  The patient has a nonischemic CM (EF 20-25%), NYHA Class IIa CHF, LBBB,  and CAD. - s/p BiV ICD implantation in 10/17 with LVEF improvement to 35-40% in 05/2016, we will repeat echo now. - Appears euvolemic.  - continue Coreg 3.125 mg bid and losartan 25 mg po daily - Continue lasix 20 mg po daily and spironolactone 12.5 mg daily, Is currently hypotensive post septic illness I'm instructing him to hold Lasix for a week.  2. Obesity Weight loss is advised, he is exercising in [employer]  3. CAD -   LHC showed 90% stenosis of LCx with PCI and stenting in 03/2015, we will d/c Plavix as he has easy bruising.  - he is asymptomatic, no ischemic workup necessary at this time  4. LBBB - s/p BiV ICD implantation   5. Hyperlipidemia - on atorvastatin  Repeat echocardiogram for LVEF evaluation, follow up in 3 months.  Signed, Ena Dawley, MD  12/14/2016 9:37 AM

## 2016-12-23 ENCOUNTER — Other Ambulatory Visit: Payer: Self-pay | Admitting: *Deleted

## 2016-12-23 DIAGNOSIS — I5022 Chronic systolic (congestive) heart failure: Secondary | ICD-10-CM

## 2016-12-23 DIAGNOSIS — I2510 Atherosclerotic heart disease of native coronary artery without angina pectoris: Secondary | ICD-10-CM

## 2016-12-23 DIAGNOSIS — I447 Left bundle-branch block, unspecified: Secondary | ICD-10-CM

## 2016-12-23 DIAGNOSIS — R059 Cough, unspecified: Secondary | ICD-10-CM

## 2016-12-23 DIAGNOSIS — R05 Cough: Secondary | ICD-10-CM

## 2016-12-23 DIAGNOSIS — I519 Heart disease, unspecified: Secondary | ICD-10-CM

## 2016-12-23 DIAGNOSIS — I428 Other cardiomyopathies: Secondary | ICD-10-CM

## 2016-12-23 MED ORDER — LOSARTAN POTASSIUM 25 MG PO TABS: 25.0000 mg | ORAL_TABLET | Freq: Every day | ORAL | 3 refills | Status: DC

## 2017-01-13 DIAGNOSIS — G4733 Obstructive sleep apnea (adult) (pediatric): Secondary | ICD-10-CM | POA: Diagnosis not present

## 2017-01-20 ENCOUNTER — Other Ambulatory Visit: Payer: Self-pay | Admitting: Cardiology

## 2017-01-20 MED ORDER — DIGOXIN 125 MCG PO TABS: 125.0000 ug | ORAL_TABLET | Freq: Every day | ORAL | 3 refills | Status: DC

## 2017-01-30 ENCOUNTER — Encounter: Payer: Medicare HMO | Admitting: *Deleted

## 2017-01-30 ENCOUNTER — Telehealth: Payer: Self-pay | Admitting: Cardiology

## 2017-01-30 NOTE — Telephone Encounter (Signed)
LMOVM reminding pt to send remote transmission.   

## 2017-02-01 ENCOUNTER — Ambulatory Visit (INDEPENDENT_AMBULATORY_CARE_PROVIDER_SITE_OTHER): Payer: Medicare HMO | Admitting: *Deleted

## 2017-02-01 DIAGNOSIS — I428 Other cardiomyopathies: Secondary | ICD-10-CM

## 2017-02-07 NOTE — Progress Notes (Signed)
Remote ICD transmission.   

## 2017-02-08 ENCOUNTER — Encounter: Payer: Self-pay | Admitting: Cardiology

## 2017-02-09 LAB — CUP PACEART REMOTE DEVICE CHECK
Battery Remaining Longevity: 67 mo
Battery Remaining Percentage: 79 %
Battery Voltage: 2.98 V
HIGH POWER IMPEDANCE MEASURED VALUE: 73 Ohm
HIGH POWER IMPEDANCE MEASURED VALUE: 73 Ohm
Implantable Lead Implant Date: 20161011
Implantable Lead Implant Date: 20161011
Implantable Lead Location: 753858
Implantable Pulse Generator Implant Date: 20161011
Lead Channel Impedance Value: 390 Ohm
Lead Channel Impedance Value: 400 Ohm
Lead Channel Pacing Threshold Amplitude: 0.75 V
Lead Channel Pacing Threshold Pulse Width: 0.5 ms
Lead Channel Sensing Intrinsic Amplitude: 11.8 mV
Lead Channel Sensing Intrinsic Amplitude: 2.8 mV
Lead Channel Setting Pacing Amplitude: 1.5 V
Lead Channel Setting Pacing Amplitude: 2 V
Lead Channel Setting Pacing Pulse Width: 0.5 ms
Lead Channel Setting Sensing Sensitivity: 0.5 mV
MDC IDC LEAD IMPLANT DT: 20161011
MDC IDC LEAD LOCATION: 753860
MDC IDC LEAD LOCATION: 753860
MDC IDC MSMT LEADCHNL LV IMPEDANCE VALUE: 580 Ohm
MDC IDC MSMT LEADCHNL LV PACING THRESHOLD AMPLITUDE: 0.75 V
MDC IDC MSMT LEADCHNL LV PACING THRESHOLD PULSEWIDTH: 0.5 ms
MDC IDC MSMT LEADCHNL RV PACING THRESHOLD AMPLITUDE: 0.75 V
MDC IDC MSMT LEADCHNL RV PACING THRESHOLD PULSEWIDTH: 0.5 ms
MDC IDC SESS DTM: 20180411081945
MDC IDC SET LEADCHNL RA PACING AMPLITUDE: 2 V
MDC IDC SET LEADCHNL RV PACING PULSEWIDTH: 0.5 ms
MDC IDC STAT BRADY AP VP PERCENT: 3.8 %
MDC IDC STAT BRADY AP VS PERCENT: 1 %
MDC IDC STAT BRADY AS VP PERCENT: 88 %
MDC IDC STAT BRADY AS VS PERCENT: 4.9 %
MDC IDC STAT BRADY RA PERCENT PACED: 1.4 %
Pulse Gen Serial Number: 7276664

## 2017-02-27 ENCOUNTER — Other Ambulatory Visit (HOSPITAL_COMMUNITY): Payer: Self-pay | Admitting: Cardiology

## 2017-02-27 MED ORDER — CARVEDILOL 3.125 MG PO TABS: ORAL_TABLET | ORAL | 8 refills | Status: DC

## 2017-02-27 NOTE — Telephone Encounter (Signed)
Followed by Meda Coffee

## 2017-03-03 ENCOUNTER — Other Ambulatory Visit (HOSPITAL_COMMUNITY): Payer: Self-pay | Admitting: Cardiology

## 2017-03-08 ENCOUNTER — Ambulatory Visit (HOSPITAL_COMMUNITY): Payer: Medicare HMO | Attending: Cardiovascular Disease

## 2017-03-08 ENCOUNTER — Other Ambulatory Visit (HOSPITAL_COMMUNITY): Payer: Self-pay | Admitting: *Deleted

## 2017-03-08 ENCOUNTER — Other Ambulatory Visit: Payer: Self-pay

## 2017-03-08 DIAGNOSIS — I519 Heart disease, unspecified: Secondary | ICD-10-CM | POA: Diagnosis not present

## 2017-03-08 DIAGNOSIS — I420 Dilated cardiomyopathy: Secondary | ICD-10-CM

## 2017-03-08 DIAGNOSIS — I447 Left bundle-branch block, unspecified: Secondary | ICD-10-CM | POA: Diagnosis not present

## 2017-03-08 DIAGNOSIS — Z95810 Presence of automatic (implantable) cardiac defibrillator: Secondary | ICD-10-CM | POA: Insufficient documentation

## 2017-03-08 DIAGNOSIS — I2510 Atherosclerotic heart disease of native coronary artery without angina pectoris: Secondary | ICD-10-CM | POA: Insufficient documentation

## 2017-03-08 DIAGNOSIS — I428 Other cardiomyopathies: Secondary | ICD-10-CM | POA: Diagnosis not present

## 2017-03-08 MED ORDER — CARVEDILOL 3.125 MG PO TABS: ORAL_TABLET | ORAL | 8 refills | Status: DC

## 2017-03-09 ENCOUNTER — Telehealth: Payer: Self-pay | Admitting: Cardiology

## 2017-03-09 NOTE — Telephone Encounter (Signed)
F/u Message  Pt returning RN call about Echo. Please call back to discuss

## 2017-03-09 NOTE — Telephone Encounter (Signed)
Left a message for the pt to call back.  

## 2017-03-10 DIAGNOSIS — Z0000 Encounter for general adult medical examination without abnormal findings: Secondary | ICD-10-CM | POA: Diagnosis not present

## 2017-03-10 DIAGNOSIS — E7800 Pure hypercholesterolemia, unspecified: Secondary | ICD-10-CM | POA: Diagnosis not present

## 2017-03-10 DIAGNOSIS — I509 Heart failure, unspecified: Secondary | ICD-10-CM | POA: Diagnosis not present

## 2017-03-10 DIAGNOSIS — I10 Essential (primary) hypertension: Secondary | ICD-10-CM | POA: Diagnosis not present

## 2017-03-10 DIAGNOSIS — R7303 Prediabetes: Secondary | ICD-10-CM | POA: Diagnosis not present

## 2017-03-10 DIAGNOSIS — I2510 Atherosclerotic heart disease of native coronary artery without angina pectoris: Secondary | ICD-10-CM | POA: Diagnosis not present

## 2017-03-10 DIAGNOSIS — Z1159 Encounter for screening for other viral diseases: Secondary | ICD-10-CM | POA: Diagnosis not present

## 2017-03-10 DIAGNOSIS — E039 Hypothyroidism, unspecified: Secondary | ICD-10-CM | POA: Diagnosis not present

## 2017-03-10 DIAGNOSIS — K219 Gastro-esophageal reflux disease without esophagitis: Secondary | ICD-10-CM | POA: Diagnosis not present

## 2017-03-13 DIAGNOSIS — H40013 Open angle with borderline findings, low risk, bilateral: Secondary | ICD-10-CM | POA: Diagnosis not present

## 2017-03-15 ENCOUNTER — Ambulatory Visit (INDEPENDENT_AMBULATORY_CARE_PROVIDER_SITE_OTHER): Payer: Medicare HMO | Admitting: Cardiology

## 2017-03-15 ENCOUNTER — Encounter: Payer: Self-pay | Admitting: Cardiology

## 2017-03-15 VITALS — BP 124/76 | HR 83 | Ht 71.0 in | Wt 273.0 lb

## 2017-03-15 DIAGNOSIS — I5022 Chronic systolic (congestive) heart failure: Secondary | ICD-10-CM

## 2017-03-15 DIAGNOSIS — Z95810 Presence of automatic (implantable) cardiac defibrillator: Secondary | ICD-10-CM | POA: Diagnosis not present

## 2017-03-15 DIAGNOSIS — I428 Other cardiomyopathies: Secondary | ICD-10-CM

## 2017-03-15 DIAGNOSIS — I447 Left bundle-branch block, unspecified: Secondary | ICD-10-CM

## 2017-03-15 DIAGNOSIS — I2510 Atherosclerotic heart disease of native coronary artery without angina pectoris: Secondary | ICD-10-CM | POA: Diagnosis not present

## 2017-03-15 MED ORDER — CARVEDILOL 6.25 MG PO TABS: 6.2500 mg | ORAL_TABLET | Freq: Two times a day (BID) | ORAL | 3 refills | Status: DC

## 2017-03-15 NOTE — Patient Instructions (Signed)
Medication Instructions:   STOP TAKING DIGOXIN NOW  INCREASE YOUR CARVEDILOL TO 6.25 MG TWICE DAILY     Follow-Up:  Your physician wants you to follow-up in: [place] will receive a reminder letter in the mail two months in advance. If you don't receive a letter, please call our office to schedule the follow-up appointment.        If you need a refill on your cardiac medications before your next appointment, please call your pharmacy.

## 2017-03-15 NOTE — Progress Notes (Signed)
Cardiology Office Note   Date:  03/17/2017   ID:  [name], [name], MRN [id number]  PCP:  Orpah Melter, MD  Cardiologist:  Dr Meda Coffee Primary Electrophysiologist: Ena Dawley, MD    Chief complain: 3 months follow up   History of Present Illness: [name] is a 76 y.o. male who presents today for electrophysiology follow-up.    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  He reports that he began having symptoms of SOB and DOE 12/15.  His symptoms progressed.  He underwent echo 11/11/14 which revealed EF 25% with moderate MR and moderate LA enlargement.  He had a myoview 2/16 that showed no prior scar and no ischemia but dilated left ventricle with moderately to severely impaired systolic function.  He has been treated with an optimal medical regimen without improvement.  He has also recently had PCI 6/16.  Despite revascularization, echo 07/01/15 reveals EF 20-25%. He continues to have SOB with moderate activity.   He has stable LE lymphedema, controlled by a low dose lasix.     05/04/2016 - 9 months follow up, he underwent BiV ICD implantation in 07/2016 with improvement of his symptoms, having more energy. 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  Denies LE edema, some only when non-compliant to low salt diet, wears compression stockings, denies orthopnea, PND, and reports being out of energy only when he avoids going to the gym. No ICD shocks, some "palpitations" when laying on the left side, improved after ICD reprogramming.   12/14/2016 - the patient is coming after 6 months, he's been doing well until 3 weeks ago when he developed flu that lead to an ER visit. He is slowly recovering. He denies any lower extremity edema orthopnea or paroxysmal nocturnal dyspnea. No palpitations or ICD  discharges. He also denies chest pain he feels very tired and has worsening dyspnea on exertion.  03/15/2017 - 3 months follow up, the patient has recovered from flu, he continues to work full time and walks most of the time at work. He denies any DOE, no CP, palpitations, dizziness or syncope. No ICD firing. He has been complaint with his meds. No LE edema, orthopnea, PND.   Past Medical History:  Diagnosis Date  . Acid reflux   . CAD (coronary artery disease)    PCI to Circ with DES on 04/06/15  . CHF (congestive heart failure) ([place])   . Degenerative disc disease, thoracic   . Hyperlipidemia   . Hyperplastic colon polyp   . LBBB (left bundle branch block)   . Merkel cell cancer ([place]) 05/2005; 07-2005   s/p gluteal resection, lymph node dissection, chemo/ radiation  . Nonischemic cardiomyopathy ([place])    a. s/p STJ CRTD 07/2015  . Obesity    Past Surgical History:  Procedure Laterality Date  . CARDIAC CATHETERIZATION N/A 04/06/2015   Procedure: Right/Left Heart Cath and Coronary Angiography;  Surgeon: Larey Dresser, MD;  Location: [facility];  Service: Cardiovascular;  Laterality: N/A;  . CARDIAC CATHETERIZATION N/A 04/06/2015   Procedure: Coronary Stent Intervention;  Surgeon: Peter M Martinique, MD;  Location: [facility];  Service: Cardiovascular;  Laterality: N/A;  . CATARACT EXTRACTION W/ INTRAOCULAR LENS  IMPLANT, BILATERAL Bilateral 11/2014  . COLONOSCOPY  2009  . EP IMPLANTABLE DEVICE N/A 08/04/2015  STJ CRTD implanted by Dr Rayann Heman for primary prevention/CHF  . KNEE CARTILAGE SURGERY Left 1960  . SKIN CANCER EXCISION  05/2005; 07/2005   excision of merkel cell  . TONSILLECTOMY       Current Outpatient Prescriptions  Medication Sig Dispense Refill  . aspirin 81 MG tablet Take 81 mg by mouth daily.    Marland Kitchen atorvastatin (LIPITOR) 80 MG tablet TAKE 1 TABLET BY MOUTH DAILY AT 6:00 PM 90 tablet 6  . fluorouracil (EFUDEX) 5 % cream Apply 5 application topically daily as  needed (Patches on face).   0  . furosemide (LASIX) 20 MG tablet Take 1 tablet (20 mg total) by mouth daily. 90 tablet 6  . ibuprofen (ADVIL,MOTRIN) 200 MG tablet Take 200 mg by mouth 2 (two) times daily as needed (pain).    Marland Kitchen levothyroxine (SYNTHROID, LEVOTHROID) 50 MCG tablet Take 50 mcg by mouth daily.  5  . losartan (COZAAR) 25 MG tablet Take 1 tablet (25 mg total) by mouth daily. 90 tablet 3  . nitroGLYCERIN (NITROSTAT) 0.4 MG SL tablet Place 1 tablet (0.4 mg total) under the tongue every 5 (five) minutes as needed for chest pain. 25 tablet 6  . POTASSIUM PO Take 550 mg by mouth daily as needed (Potassium supplement).     Marland Kitchen spironolactone (ALDACTONE) 25 MG tablet Take 0.5 tablets (12.5 mg total) by mouth daily. 135 tablet 6  . carvedilol (COREG) 6.25 MG tablet Take 1 tablet (6.25 mg total) by mouth 2 (two) times daily. 180 tablet 3   No current facility-administered medications for this visit.    Facility-Administered Medications Ordered in Other Visits  Medication Dose Route Frequency Provider Last Rate Last Dose  . adenosine (diagnostic) (ADENOSCAN) infusion 68.4 mg  0.56 mg/kg Intravenous Once Crenshaw, Denice Bors, MD        Allergies:   Tape   Social History:  The patient  reports that he quit smoking about 33 years ago. His smoking use included Cigarettes. He has a 3.75 pack-year smoking history. He has never used smokeless tobacco. He reports that he drinks alcohol. He reports that he does not use drugs.   Family History:  The patient's  family history includes Cancer in his father and mother; Lupus in his mother; Stroke in his sister; Thyroid disease in his sister.    ROS:  Please see the history of present illness.   All other systems are reviewed and negative.    PHYSICAL EXAM: VS:  BP 124/76   Pulse 83   Ht 5\' 11"  (1.803 m)   Wt 273 lb (123.8 kg)   BMI 38.08 kg/m  , BMI Body mass index is 38.08 kg/m. GEN: overweight, in no acute distress  HEENT: normal  Neck: no JVD,  carotid bruits, or masses Cardiac: RRR; no murmurs, rubs, or gallops,R leg edema (chronic) Respiratory:  clear to auscultation bilaterally, normal work of breathing GI: soft, nontender, nondistended, + BS MS: no deformity or atrophy  Skin: warm and dry  Neuro:  Strength and sensation are intact Psych: euthymic mood, full affect  EKG:  EKG is ordered today. The ekg ordered today shows sinus rhythm 83 bpm, PR 242, QRS 174 (LBBB), QTc491   Recent Labs: 05/04/2016: ALT 19; BUN 19; Creat 0.99; Hemoglobin 13.0; Platelets 161; Potassium 4.2; Sodium 142    Lipid Panel     Component Value Date/Time   CHOL 88 (L) 05/04/2016 1001   TRIG 48 05/04/2016 1001   HDL 47 05/04/2016 1001  CHOLHDL 1.9 05/04/2016 1001   VLDL 10 05/04/2016 1001   LDLCALC 31 05/04/2016 1001     Wt Readings from Last 3 Encounters:  03/15/17 273 lb (123.8 kg)  12/14/16 267 lb 12.8 oz (121.5 kg)  07/21/16 268 lb (121.6 kg)    TTE: 07/01/2015 Study Conclusions - Left ventricle: The cavity size was normal. Wall thickness was increased in a pattern of mild LVH. Systolic function was severely reduced. The estimated ejection fraction was in the range of 20% to 25%. There is akinesis of the anteroseptal and inferoseptal myocardium. - Mitral valve: Calcified annulus. There was mild regurgitation.  Impressions: - Since last echocardiogram, EF has mildly improved.   TTE: 10/2015 - Left ventricle: The cavity size was moderately dilated. There was  mild concentric hypertrophy. Systolic function was moderately  reduced. The estimated ejection fraction was in the range of 35%  to 40%. Hypokinesis of the basal-midinferolateral myocardium. -Impressions: - 2D study only. Compared to the previous study, LVEF has improved.  TTE: 02/2017 - Left ventricle: Diffuse hypokinesis with abnormal septal motion.   The cavity size was moderately dilated. Wall thickness was   increased in a pattern of moderate LVH. Systolic  function was   moderately reduced. The estimated ejection fraction was in the   range of 35% to 40%. Doppler parameters are consistent with both   elevated ventricular end-diastolic filling pressure and elevated   left atrial filling pressure. - Mitral valve: There was mild regurgitation. - Left atrium: The atrium was moderately dilated. - Atrial septum: No defect or patent foramen ovale was identified. - Pulmonary arteries: PA peak pressure: 33 mm Hg (S).  ECG: 03/15/17 - personally reviewed, a sensed, BiV paced rhythm, PVCs, unchanged from prior   ASSESSMENT AND PLAN:  1.  The patient has a nonischemic CM (EF 20-25% on TTE in 9/16), NYHA Class IIa CHF, LBBB, and CAD. - s/p BiV ICD implantation in 10/17 with LVEF improvement to 35-40% in 05/2016, no further improvement on TTE in 5/18. - Appears euvolemic.  - increase Coreg 6.25 mg bid and losartan 25 mg po daily - Continue lasix 20 mg po daily and spironolactone 12.5 mg daily - discontinue digoxin  2. Obesity Weight loss is advised, he is exercising in [employer]  3. CAD -   LHC showed 90% stenosis of LCx with PCI and stenting in 03/2015, we will d/c Plavix as he has easy bruising.  - he is asymptomatic, no ischemic workup necessary at this time  4. LBBB - s/p BiV ICD implantation   5. Hyperlipidemia - on atorvastatin  Follow up in 6 months  Signed, Ena Dawley, MD  03/17/2017 12:40 AM

## 2017-03-17 ENCOUNTER — Telehealth: Payer: Self-pay

## 2017-03-17 ENCOUNTER — Other Ambulatory Visit: Payer: Medicare HMO | Admitting: *Deleted

## 2017-03-17 ENCOUNTER — Telehealth: Payer: Self-pay | Admitting: Internal Medicine

## 2017-03-17 DIAGNOSIS — I428 Other cardiomyopathies: Secondary | ICD-10-CM | POA: Diagnosis not present

## 2017-03-17 DIAGNOSIS — I472 Ventricular tachycardia, unspecified: Secondary | ICD-10-CM

## 2017-03-17 LAB — MAGNESIUM: Magnesium: 2.2 mg/dL (ref 1.6–2.3)

## 2017-03-17 NOTE — Telephone Encounter (Signed)
Spoke with pt regarding shock from 5/24 at 6:47am, pt stated that he had gone out to put some ladders on the truck got sweaty and felt really fatigued and he sat down then he felt the shock, after shock pt felt ok. Pt stated that he hadn't taken any of his medications that morning, he was running late, he normally takes them first thing in the morning. Asked pt if he had a way to take his BP, pt stated that he did, pt obtained BP while on the phone and stated ti was 143/86 and pt had taken his medications this morning.  Informed pt that Dr. Caryl Comes recommended pt have labs drawn, pt stated that he had labs drawn at [facility] on Monday 03/13/2016. Informed pt that I would call and request labs. Informed pt of driving restrictions for 6 months, pt voiced understanding.

## 2017-03-17 NOTE — Telephone Encounter (Signed)
Pt asked if it was ok to go to the gym, informed pt that as long as he had taken his medications that it was ok to go about his normal daily activities. Informed pt that he started to feel like he did prior to getting the shock or extremely more fatigued than he normally does during exercise or had dizziness he should stop whatever he was doing and sit down. Pt voiced understanding.

## 2017-03-17 NOTE — Progress Notes (Signed)
  Mg

## 2017-03-17 NOTE — Addendum Note (Signed)
Addended by: Eulis Foster on: 03/17/2017 11:45 AM   Modules accepted: Orders

## 2017-03-17 NOTE — Telephone Encounter (Signed)
Labs received from [place], Potassium 4.2. Pt contacted and agreeable to apt at [address] to have Magnesium drawn, pt agreeable to apt with Chanetta Marshall NP 5/29 at 12:20pm.

## 2017-03-17 NOTE — Telephone Encounter (Signed)
Confirmed with pt that per Dr. Francesca Oman OV note from 5/23 that he is taking an increased does of Coreg 6.25mg  twice daily.

## 2017-03-17 NOTE — Telephone Encounter (Signed)
°  Follow Up   Has some additional questions regarding his device. Please call.

## 2017-03-19 NOTE — Progress Notes (Signed)
Electrophysiology Office Note Date: 03/21/2017  ID:  Marcus, Christensen May 15, 1941, MRN [id number]  PCP: Orpah Melter, MD Primary Cardiologist: Meda Coffee Electrophysiologist: Allred  CC: follow up ICD shock  [name] is a 76 y.o. male seen today for Dr Rayann Heman.  He presents today for follow up after receiving ICD shock last week.  At that time, he was working outside. He developed fatigue and went inside to sit down. He then got lightheaded and his ICD fired. He felt well afterwards. Over the weekend, he has had fatigue, nausea.  No vomiting or diarrhea.  He has been eating out more lately and has gained 10 pounds in the last couple of months.  He denies chest pain, dyspnea, PND, orthopnea, vomiting, dizziness, syncope, or early satiety.     Device History: STJ CRTD implanted 2016 for ICM, CHF, LBBB History of appropriate therapy: yes - 02/2017 History of AAD therapy: No   Past Medical History:  Diagnosis Date  . Acid reflux   . CAD (coronary artery disease)    PCI to Circ with DES on 04/06/15  . CHF (congestive heart failure) ([place])   . Degenerative disc disease, thoracic   . Hyperlipidemia   . Hyperplastic colon polyp   . LBBB (left bundle branch block)   . Merkel cell cancer ([place]) 05/2005; 07-2005   s/p gluteal resection, lymph node dissection, chemo/ radiation  . Nonischemic cardiomyopathy ([place])    a. s/p STJ CRTD 07/2015  . Obesity   . Ventricular fibrillation ([place])    a. appropriate ICD therapy 02/2017   Past Surgical History:  Procedure Laterality Date  . CARDIAC CATHETERIZATION N/A 04/06/2015   Procedure: Right/Left Heart Cath and Coronary Angiography;  Surgeon: Larey Dresser, MD;  Location: [facility];  Service: Cardiovascular;  Laterality: N/A;  . CARDIAC CATHETERIZATION N/A 04/06/2015   Procedure: Coronary Stent Intervention;  Surgeon: Peter M Martinique, MD;  Location: [facility];  Service: Cardiovascular;  Laterality: N/A;  . CATARACT  EXTRACTION W/ INTRAOCULAR LENS  IMPLANT, BILATERAL Bilateral 11/2014  . COLONOSCOPY  2009  . EP IMPLANTABLE DEVICE N/A 08/04/2015   STJ CRTD implanted by Dr Rayann Heman for primary prevention/CHF  . KNEE CARTILAGE SURGERY Left 1960  . SKIN CANCER EXCISION  05/2005; 07/2005   excision of merkel cell  . TONSILLECTOMY      Current Outpatient Prescriptions  Medication Sig Dispense Refill  . aspirin 81 MG tablet Take 81 mg by mouth daily.    Marland Kitchen atorvastatin (LIPITOR) 80 MG tablet TAKE 1 TABLET BY MOUTH DAILY AT 6:00 PM 90 tablet 6  . fluorouracil (EFUDEX) 5 % cream Apply 5 application topically daily as needed (Patches on face).   0  . furosemide (LASIX) 20 MG tablet Take 1 tablet (20 mg total) by mouth daily. 90 tablet 6  . ibuprofen (ADVIL,MOTRIN) 200 MG tablet Take 200 mg by mouth 2 (two) times daily as needed (pain).    Marland Kitchen levothyroxine (SYNTHROID, LEVOTHROID) 50 MCG tablet Take 50 mcg by mouth daily.  5  . losartan (COZAAR) 25 MG tablet Take 1 tablet (25 mg total) by mouth daily. 90 tablet 3  . nitroGLYCERIN (NITROSTAT) 0.4 MG SL tablet Place 1 tablet (0.4 mg total) under the tongue every 5 (five) minutes as needed for chest pain. 25 tablet 6  . POTASSIUM PO Take 550 mg by mouth daily as needed (Potassium supplement).     Marland Kitchen spironolactone (ALDACTONE) 25 MG tablet Take 0.5 tablets (  12.5 mg total) by mouth daily. 135 tablet 6  . carvedilol (COREG) 12.5 MG tablet Take 1 tablet (12.5 mg total) by mouth 2 (two) times daily. 180 tablet 3   No current facility-administered medications for this visit.    Facility-Administered Medications Ordered in Other Visits  Medication Dose Route Frequency Provider Last Rate Last Dose  . adenosine (diagnostic) (ADENOSCAN) infusion 68.4 mg  0.56 mg/kg Intravenous Once Lelon Perla, MD        Allergies:   Tape   Social History: Social History   Social History  . Marital status: Married    Spouse name: bridgette  . Number of children: 2  . Years of  education: college   Occupational History  . [employer] services    Social History Main Topics  . Smoking status: Former Smoker    Packs/day: 0.25    Years: 15.00    Types: Cigarettes    Quit date: 10/25/1983  . Smokeless tobacco: Never Used  . Alcohol use 0.0 oz/week     Comment: 08/04/2015 "I doubt if I average a glass of wine and 1 beer q week"  . Drug use: No  . Sexual activity: Not Currently   Other Topics Concern  . Not on file   Social History Narrative   Pt lives in [place], married x 45 years.  2 grown sons have HTN.   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 Cancer Father        prostate  . Cancer Mother        breast  . Lupus Mother   . Stroke Sister   . Thyroid disease Sister   . Heart attack Neg Hx   . Hypertension Neg Hx     Review of Systems: All other systems reviewed and are otherwise negative except as noted above.   Physical Exam: VS:  BP 116/70   Pulse 90   Ht 5\' 11"  (1.803 m)   Wt 271 lb (122.9 kg)   SpO2 95%   BMI 37.80 kg/m  , BMI Body mass index is 37.8 kg/m.  GEN- The patient is obese appearing, alert and oriented x 3 today.   HEENT: normocephalic, atraumatic; sclera clear, conjunctiva pink; hearing intact; oropharynx clear; neck supple Lungs- Clear to ausculation bilaterally, normal work of breathing.  No wheezes, rales, rhonchi Heart- Regular rate and rhythm (paced) with frequent ectopy GI- soft, non-tender, non-distended, bowel sounds present Extremities- no clubbing, cyanosis, or edema  MS- no significant deformity or atrophy Skin- warm and dry, no rash or lesion; ICD pocket well healed Psych- euthymic mood, full affect Neuro- strength and sensation are intact  ICD interrogation- reviewed in detail today,  See PACEART report  EKG:  EKG is not ordered today.  Recent Labs: 05/04/2016: ALT 19; BUN 19; Creat 0.99; Hemoglobin 13.0; Platelets 161;  Potassium 4.2; Sodium 142 03/17/2017: Magnesium 2.2   Wt Readings from Last 3 Encounters:  03/21/17 271 lb (122.9 kg)  03/15/17 273 lb (123.8 kg)  12/14/16 267 lb 12.8 oz (121.5 kg)     Other studies Reviewed: Additional studies/ records that were reviewed today include: Dr Jackalyn Lombard office notes, hospital records  Assessment and Plan:  1.  Chronic systolic dysfunction euvolemic today Stable on an appropriate medical regimen Normal CRTD function - he has had intermittent diaphragmatic stim - LV output again reduced today - pulse width extended to 0.62msec See  Pace Art report No changes today  2.  Ventricular fibrillation Episode started with PVC followed by VF at a cycle length of 256msec.  ATP during shock accelerated rhythm, was terminated with single HV shock.  Increase Coreg to 12.5mg  twice daily today BMET, Mg stable Will not start AAD therapy today as we have room to up-titrate BB and first appropriate therapy Last cath 03/2015 witih DES to LCx at that time.  He has not had recent ischemic symptoms   3.  CAD No recent ischemic symptoms Continue medical therapy  4.  Obesity Weight loss advised Body mass index is 37.8 kg/m.  Current medicines are reviewed at length with the patient today.   The patient does not have concerns regarding his medicines.  The following changes were made today:  none  Labs/ tests ordered today include: none   Disposition:   Follow up with ICM clinic, Dr Meda Coffee as scheduled, me in 3 weeks to follow up on PVC burden, change in Coreg dose.    Signed, Chanetta Marshall, NP 03/21/2017 12:46 PM  [address] [place] [address] [phone] (office) [phone] (fax)

## 2017-03-20 ENCOUNTER — Encounter: Payer: Self-pay | Admitting: Nurse Practitioner

## 2017-03-21 ENCOUNTER — Encounter: Payer: Self-pay | Admitting: Nurse Practitioner

## 2017-03-21 ENCOUNTER — Ambulatory Visit (INDEPENDENT_AMBULATORY_CARE_PROVIDER_SITE_OTHER): Payer: Medicare HMO | Admitting: Nurse Practitioner

## 2017-03-21 ENCOUNTER — Telehealth: Payer: Self-pay

## 2017-03-21 VITALS — BP 116/70 | HR 90 | Ht 71.0 in | Wt 271.0 lb

## 2017-03-21 DIAGNOSIS — I2510 Atherosclerotic heart disease of native coronary artery without angina pectoris: Secondary | ICD-10-CM | POA: Diagnosis not present

## 2017-03-21 DIAGNOSIS — I472 Ventricular tachycardia, unspecified: Secondary | ICD-10-CM

## 2017-03-21 DIAGNOSIS — I5022 Chronic systolic (congestive) heart failure: Secondary | ICD-10-CM

## 2017-03-21 LAB — CUP PACEART INCLINIC DEVICE CHECK
Date Time Interrogation Session: 20180529121036
Implantable Lead Implant Date: 20161011
Implantable Lead Location: 753860
Implantable Pulse Generator Implant Date: 20161011
MDC IDC LEAD IMPLANT DT: 20161011
MDC IDC LEAD IMPLANT DT: 20161011
MDC IDC LEAD LOCATION: 753858
MDC IDC LEAD LOCATION: 753860
MDC IDC PG SERIAL: 7276664

## 2017-03-21 MED ORDER — CARVEDILOL 12.5 MG PO TABS
12.5000 mg | ORAL_TABLET | Freq: Two times a day (BID) | ORAL | 3 refills | Status: DC
Start: 2017-03-21 — End: 2017-04-10

## 2017-03-21 NOTE — Telephone Encounter (Signed)
Left message for patient to call back for lab results

## 2017-03-21 NOTE — Patient Instructions (Addendum)
Medication Instructions:    Your physician has recommended you make the following change in your medication:  1) INCREASE Carvedilol to 12.5 mg twice a day  - If you need a refill on your cardiac medications before your next appointment, please call your pharmacy.   Labwork:  None ordered  Testing/Procedures:  None ordered  Follow-Up:  Your physician recommends that you schedule a follow-up appointment in: 3 weeks with Chanetta Marshall, NP.   Thank you for choosing [facility]!!    Any Other Special Instructions Will Be Listed Below (If Applicable).  Weight your self daily. Please weigh yourself first thing in the morning, when you wake up.   Please maintain a low salt diet  Low-Sodium Eating Plan Sodium, which is an element that makes up salt, helps you maintain a healthy balance of fluids in your body. Too much sodium can increase your blood pressure and cause fluid and waste to be held in your body. Your health care provider or dietitian may recommend following this plan if you have high blood pressure (hypertension), kidney disease, liver disease, or heart failure. Eating less sodium can help lower your blood pressure, reduce swelling, and protect your heart, liver, and kidneys. What are tips for following this plan? General guidelines   Most people on this plan should limit their sodium intake to 1,500-2,000 mg (milligrams) of sodium each day. Reading food labels   The Nutrition Facts label lists the amount of sodium in one serving of the food. If you eat more than one serving, you must multiply the listed amount of sodium by the number of servings.  Choose foods with less than 140 mg of sodium per serving.  Avoid foods with 300 mg of sodium or more per serving. Shopping   Look for lower-sodium products, often labeled as "low-sodium" or "no salt added."  Always check the sodium content even if foods are labeled as "unsalted" or "no salt added".  Buy fresh  foods.  Avoid canned foods and premade or frozen meals.  Avoid canned, cured, or processed meats  Buy breads that have less than 80 mg of sodium per slice. Cooking   Eat more home-cooked food and less restaurant, buffet, and fast food.  Avoid adding salt when cooking. Use salt-free seasonings or herbs instead of table salt or sea salt. Check with your health care provider or pharmacist before using salt substitutes.  Cook with plant-based oils, such as canola, sunflower, or olive oil. Meal planning   When eating at a restaurant, ask that your food be prepared with less salt or no salt, if possible.  Avoid foods that contain MSG (monosodium glutamate). MSG is sometimes added to [place] food, bouillon, and some canned foods. What foods are recommended? The items listed may not be a complete list. Talk with your dietitian about what dietary choices are best for you. Grains  Low-sodium cereals, including oats, puffed wheat and rice, and shredded wheat. Low-sodium crackers. Unsalted rice. Unsalted pasta. Low-sodium bread. Whole-grain breads and whole-grain pasta. Vegetables  Fresh or frozen vegetables. "No salt added" canned vegetables. "No salt added" tomato sauce and paste. Low-sodium or reduced-sodium tomato and vegetable juice. Fruits  Fresh, frozen, or canned fruit. Fruit juice. Meats and other protein foods  Fresh or frozen (no salt added) meat, poultry, seafood, and fish. Low-sodium canned tuna and salmon. Unsalted nuts. Dried peas, beans, and lentils without added salt. Unsalted canned beans. Eggs. Unsalted nut butters. Dairy  Milk. Soy milk. Cheese that is naturally low  in sodium, such as ricotta cheese, fresh mozzarella, or Swiss cheese Low-sodium or reduced-sodium cheese. Cream cheese. Yogurt. Fats and oils  Unsalted butter. Unsalted margarine with no trans fat. Vegetable oils such as canola or olive oils. Seasonings and other foods  Fresh and dried herbs and spices. Salt-free  seasonings. Low-sodium mustard and ketchup. Sodium-free salad dressing. Sodium-free light mayonnaise. Fresh or refrigerated horseradish. Lemon juice. Vinegar. Homemade, reduced-sodium, or low-sodium soups. Unsalted popcorn and pretzels. Low-salt or salt-free chips. What foods are not recommended? The items listed may not be a complete list. Talk with your dietitian about what dietary choices are best for you. Grains  Instant hot cereals. Bread stuffing, pancake, and biscuit mixes. Croutons. Seasoned rice or pasta mixes. Noodle soup cups. Boxed or frozen macaroni and cheese. Regular salted crackers. Self-rising flour. Vegetables  Sauerkraut, pickled vegetables, and relishes. Olives. [place] fries. Onion rings. Regular canned vegetables (not low-sodium or reduced-sodium). Regular canned tomato sauce and paste (not low-sodium or reduced-sodium). Regular tomato and vegetable juice (not low-sodium or reduced-sodium). Frozen vegetables in sauces. Meats and other protein foods  Meat or fish that is salted, canned, smoked, spiced, or pickled. Bacon, ham, sausage, hotdogs, corned beef, chipped beef, packaged lunch meats, salt pork, jerky, pickled herring, anchovies, regular canned tuna, sardines, salted nuts. Dairy  Processed cheese and cheese spreads. Cheese curds. Blue cheese. Feta cheese. String cheese. Regular cottage cheese. Buttermilk. Canned milk. Fats and oils  Salted butter. Regular margarine. Ghee. Bacon fat. Seasonings and other foods  Onion salt, garlic salt, seasoned salt, table salt, and sea salt. Canned and packaged gravies. Worcestershire sauce. Tartar sauce. Barbecue sauce. Teriyaki sauce. Soy sauce, including reduced-sodium. Steak sauce. Fish sauce. Oyster sauce. Cocktail sauce. Horseradish that you find on the shelf. Regular ketchup and mustard. Meat flavorings and tenderizers. Bouillon cubes. Hot sauce and Tabasco sauce. Premade or packaged marinades. Premade or packaged taco seasonings.  Relishes. Regular salad dressings. Salsa. Potato and tortilla chips. Corn chips and puffs. Salted popcorn and pretzels. Canned or dried soups. Pizza. Frozen entrees and pot pies. Summary  Eating less sodium can help lower your blood pressure, reduce swelling, and protect your heart, liver, and kidneys.  Most people on this plan should limit their sodium intake to 1,500-2,000 mg (milligrams) of sodium each day.  Canned, boxed, and frozen foods are high in sodium. Restaurant foods, fast foods, and pizza are also very high in sodium. You also get sodium by adding salt to food.  Try to cook at home, eat more fresh fruits and vegetables, and eat less fast food, canned, processed, or prepared foods. This information is not intended to replace advice given to you by your health care provider. Make sure you discuss any questions you have with your health care provider. Document Released: 04/01/2002 Document Revised: 10/03/2016 Document Reviewed: 10/03/2016 Elsevier Interactive Patient Education  2017 [employer].

## 2017-03-22 ENCOUNTER — Encounter: Payer: Self-pay | Admitting: Internal Medicine

## 2017-03-28 ENCOUNTER — Other Ambulatory Visit: Payer: Self-pay | Admitting: Internal Medicine

## 2017-03-29 DIAGNOSIS — Z08 Encounter for follow-up examination after completed treatment for malignant neoplasm: Secondary | ICD-10-CM | POA: Diagnosis not present

## 2017-03-29 DIAGNOSIS — L570 Actinic keratosis: Secondary | ICD-10-CM | POA: Diagnosis not present

## 2017-03-29 DIAGNOSIS — Z85828 Personal history of other malignant neoplasm of skin: Secondary | ICD-10-CM | POA: Diagnosis not present

## 2017-03-29 DIAGNOSIS — L821 Other seborrheic keratosis: Secondary | ICD-10-CM | POA: Diagnosis not present

## 2017-04-10 ENCOUNTER — Ambulatory Visit (INDEPENDENT_AMBULATORY_CARE_PROVIDER_SITE_OTHER): Payer: Medicare HMO | Admitting: Internal Medicine

## 2017-04-10 ENCOUNTER — Encounter: Payer: Self-pay | Admitting: Internal Medicine

## 2017-04-10 VITALS — BP 98/66 | HR 86 | Ht 71.0 in | Wt 272.0 lb

## 2017-04-10 DIAGNOSIS — I5022 Chronic systolic (congestive) heart failure: Secondary | ICD-10-CM

## 2017-04-10 DIAGNOSIS — I428 Other cardiomyopathies: Secondary | ICD-10-CM | POA: Diagnosis not present

## 2017-04-10 DIAGNOSIS — Z95810 Presence of automatic (implantable) cardiac defibrillator: Secondary | ICD-10-CM

## 2017-04-10 MED ORDER — CARVEDILOL 12.5 MG PO TABS: 6.2500 mg | ORAL_TABLET | Freq: Two times a day (BID) | ORAL | 3 refills | Status: DC

## 2017-04-10 NOTE — Progress Notes (Signed)
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  Since last being seen in our clinic, the patient reports doing ok.  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  He was seen by Chanetta Marshall NP and coreg was increased to reduce ventricular ectopy.  He reports significant increase in fatigue with this change.  He would like to return to prior coreg dosing currently.   Today, he denies symptoms of palpitations, chest pain, shortness of breath,  lower extremity edema, dizziness, presyncope, syncope, or further ICD shocks.  The patient is otherwise without complaint today.   Past Medical History:  Diagnosis Date  . Acid reflux   . CAD (coronary artery disease)    PCI to Circ with DES on 04/06/15  . CHF (congestive heart failure) ([place])   . Degenerative disc disease, thoracic   . Hyperlipidemia   . Hyperplastic colon polyp   . LBBB (left bundle branch block)   . 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 Nonischemic cardiomyopathy ([place])    a. s/p STJ CRTD 07/2015  . Obesity   . Ventricular fibrillation ([place])    a. appropriate ICD therapy 02/2017   Past Surgical History:  Procedure Laterality Date  . CARDIAC CATHETERIZATION N/A 04/06/2015   Procedure: Right/Left Heart Cath and Coronary Angiography;  Surgeon: Larey Dresser, MD;  Location: [facility];  Service: Cardiovascular;  Laterality: N/A;  . CARDIAC CATHETERIZATION N/A 04/06/2015   Procedure: Coronary Stent Intervention;  Surgeon: Peter M Martinique, MD;  Location: [facility];  Service: Cardiovascular;  Laterality: N/A;  . CATARACT EXTRACTION W/ INTRAOCULAR LENS  IMPLANT, BILATERAL Bilateral 11/2014  . COLONOSCOPY  2009  . EP IMPLANTABLE DEVICE N/A 08/04/2015   STJ CRTD implanted by Dr Rayann Heman for primary prevention/CHF  . KNEE CARTILAGE SURGERY Left  1960  . SKIN CANCER EXCISION  05/2005; 07/2005   excision of merkel cell  . TONSILLECTOMY      ROS- all systems are reviewed and negative except as per HPI above  Current Outpatient Prescriptions  Medication Sig Dispense Refill  . aspirin 81 MG tablet Take 81 mg by mouth daily.    Marland Kitchen atorvastatin (LIPITOR) 80 MG tablet TAKE 1 TABLET BY MOUTH DAILY AT 6:00 PM 90 tablet 6  . carvedilol (COREG) 12.5 MG tablet Take 1 tablet (12.5 mg total) by mouth 2 (two) times daily. 180 tablet 3  . fluorouracil (EFUDEX) 5 % cream Apply 5 application topically daily as needed (Patches on face).   0  . furosemide (LASIX) 20 MG tablet Take 1 tablet (20 mg total) by mouth daily. 90 tablet 6  . ibuprofen (ADVIL,MOTRIN) 200 MG tablet Take 200 mg by mouth 2 (two) times daily as needed (pain).    Marland Kitchen levothyroxine (SYNTHROID, LEVOTHROID) 50 MCG tablet Take 50 mcg by mouth daily.  5  . losartan (COZAAR) 25 MG tablet Take 1 tablet (25 mg total) by mouth daily. 90 tablet 3  . nitroGLYCERIN (NITROSTAT) 0.4 MG SL tablet Place 1 tablet (0.4 mg total) under the tongue every 5 (five) minutes as needed for chest pain. 25 tablet 6  . POTASSIUM PO Take 550 mg by mouth daily as needed (Potassium supplement).     Marland Kitchen spironolactone (ALDACTONE) 25 MG tablet Take 0.5 tablets (12.5 mg total) by mouth daily. 135 tablet 6  No current facility-administered medications for this visit.    Facility-Administered Medications Ordered in Other Visits  Medication Dose Route Frequency Provider Last Rate Last Dose  . adenosine (diagnostic) (ADENOSCAN) infusion 68.4 mg  0.56 mg/kg Intravenous Once Lelon Perla, MD        Physical Exam: Vitals:   04/10/17 1621  BP: 98/66  Pulse: 86  SpO2: 95%  Weight: 272 lb (123.4 kg)  Height: 5\' 11"  (1.803 m)    GEN- The patient is well appearing, alert and oriented x 3 today.   Head- normocephalic, atraumatic Eyes-  Sclera clear, conjunctiva pink Ears- hearing intact Oropharynx- clear Lungs-  Clear to ausculation bilaterally, normal work of breathing Chest- ICD pocket is well healed Heart- Regular rate and rhythm, no murmurs, rubs or gallops, PMI not laterally displaced GI- soft, NT, ND, + BS Extremities- no clubbing, cyanosis, or edema  ICD interrogation- reviewed in detail today,  See PACEART report  Assessment and Plan:  1.  Chronic systolic dysfunction euvolemic today Stable on an appropriate medical regimen Normal ICD function See Pace Art report No changes today  2. 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

## 2017-04-10 NOTE — Patient Instructions (Signed)
Medication Instructions:  Your physician has recommended you make the following change in your medication:  1.  Decrease carvedilol to 6.25 mg twice a day  Labwork: None ordered  Testing/Procedures: None ordered  Follow-Up: Your physician recommends that you schedule a follow-up appointment in: 3 months with Dr. [name].  Remote monitoring is used to monitor your ICD from home. This monitoring reduces the number of office visits required to check your device to one time per year. It allows [place] to keep an eye on the functioning of your device to ensure it is working properly. You are scheduled for a device check from home on 05/09/2017. You may send your transmission at any time that day. If you have a wireless device, the transmission will be sent automatically. After your physician reviews your transmission, you will receive a postcard with your next transmission date.   Any Other Special Instructions Will Be Listed Below (If Applicable).     If you need a refill on your cardiac medications before your next appointment, please call your pharmacy.

## 2017-05-01 LAB — CUP PACEART INCLINIC DEVICE CHECK
Brady Statistic RA Percent Paced: 1.4 %
Brady Statistic RV Percent Paced: 91 %
HighPow Impedance: 69.75 Ohm
Implantable Lead Location: 753860
Implantable Lead Location: 753860
Lead Channel Impedance Value: 475 Ohm
Lead Channel Impedance Value: 575 Ohm
Lead Channel Setting Pacing Amplitude: 1.5 V
Lead Channel Setting Pacing Amplitude: 2 V
Lead Channel Setting Pacing Pulse Width: 0.5 ms
Lead Channel Setting Sensing Sensitivity: 0.5 mV
MDC IDC LEAD IMPLANT DT: 20161011
MDC IDC LEAD IMPLANT DT: 20161011
MDC IDC LEAD IMPLANT DT: 20161011
MDC IDC LEAD LOCATION: 753858
MDC IDC MSMT BATTERY REMAINING LONGEVITY: 64 mo
MDC IDC MSMT LEADCHNL RA IMPEDANCE VALUE: 400 Ohm
MDC IDC PG IMPLANT DT: 20161011
MDC IDC PG SERIAL: 7276664
MDC IDC SESS DTM: 20180618202731
MDC IDC SET LEADCHNL LV PACING PULSEWIDTH: 0.8 ms
MDC IDC SET LEADCHNL RA PACING AMPLITUDE: 2 V

## 2017-05-09 ENCOUNTER — Telehealth: Payer: Self-pay | Admitting: Cardiology

## 2017-05-09 ENCOUNTER — Encounter: Payer: Medicare HMO | Admitting: *Deleted

## 2017-05-09 NOTE — Telephone Encounter (Signed)
LMOVM reminding pt to send remote transmission.   

## 2017-05-11 ENCOUNTER — Encounter: Payer: Self-pay | Admitting: Cardiology

## 2017-05-12 ENCOUNTER — Other Ambulatory Visit: Payer: Self-pay | Admitting: Cardiology

## 2017-05-12 DIAGNOSIS — R059 Cough, unspecified: Secondary | ICD-10-CM

## 2017-05-12 DIAGNOSIS — R05 Cough: Secondary | ICD-10-CM

## 2017-05-12 DIAGNOSIS — E785 Hyperlipidemia, unspecified: Secondary | ICD-10-CM

## 2017-05-12 DIAGNOSIS — I5022 Chronic systolic (congestive) heart failure: Secondary | ICD-10-CM

## 2017-05-12 DIAGNOSIS — I447 Left bundle-branch block, unspecified: Secondary | ICD-10-CM

## 2017-05-12 DIAGNOSIS — I428 Other cardiomyopathies: Secondary | ICD-10-CM

## 2017-05-12 DIAGNOSIS — I2510 Atherosclerotic heart disease of native coronary artery without angina pectoris: Secondary | ICD-10-CM

## 2017-05-12 DIAGNOSIS — I519 Heart disease, unspecified: Secondary | ICD-10-CM

## 2017-05-12 NOTE — Telephone Encounter (Signed)
Manual transmission received today.

## 2017-05-12 NOTE — Telephone Encounter (Signed)
Spoke with [employer] rep.  Advised of patient's issues with monitor and she will plan to call patient this morning to offer further assistance.

## 2017-05-12 NOTE — Telephone Encounter (Signed)
LMOVM advising that [employer] rep is supposed to reach out to patient.  Requested that he call back with any questions or concerns.  Gave direct phone number.

## 2017-05-12 NOTE — Telephone Encounter (Signed)
Patient called to let us know that when he presses the white button on his monitor, nothing happens.  Green light is on indicating power to the monitor.  Cell adapter is plugged in and has a green light flashing on it.  Patient has tried to reach [employer] 3 times unsuccessfully and states that he receives a message that the "wait time is too long and to please call again later."  Advised patient I will contact tech services and call him back.  He is appreciative and denies additional questions or concerns at this time.

## 2017-05-12 NOTE — Telephone Encounter (Signed)
Patient returned call.  He states that [employer] is working to resolve the issue from their end and will reach back out to him today.  Patient agrees to let us know if we should reschedule his remote transmission if they order him a new monitor.

## 2017-06-03 ENCOUNTER — Other Ambulatory Visit: Payer: Self-pay | Admitting: Cardiology

## 2017-06-03 DIAGNOSIS — I519 Heart disease, unspecified: Secondary | ICD-10-CM

## 2017-06-03 DIAGNOSIS — I447 Left bundle-branch block, unspecified: Secondary | ICD-10-CM

## 2017-06-03 DIAGNOSIS — I5022 Chronic systolic (congestive) heart failure: Secondary | ICD-10-CM

## 2017-06-03 DIAGNOSIS — I428 Other cardiomyopathies: Secondary | ICD-10-CM

## 2017-06-03 DIAGNOSIS — R059 Cough, unspecified: Secondary | ICD-10-CM

## 2017-06-03 DIAGNOSIS — R05 Cough: Secondary | ICD-10-CM

## 2017-06-03 DIAGNOSIS — E785 Hyperlipidemia, unspecified: Secondary | ICD-10-CM

## 2017-06-03 DIAGNOSIS — I2510 Atherosclerotic heart disease of native coronary artery without angina pectoris: Secondary | ICD-10-CM

## 2017-06-29 DIAGNOSIS — Z23 Encounter for immunization: Secondary | ICD-10-CM | POA: Diagnosis not present

## 2017-06-29 DIAGNOSIS — B351 Tinea unguium: Secondary | ICD-10-CM | POA: Diagnosis not present

## 2017-06-29 DIAGNOSIS — I890 Lymphedema, not elsewhere classified: Secondary | ICD-10-CM | POA: Diagnosis not present

## 2017-07-14 ENCOUNTER — Encounter: Payer: Self-pay | Admitting: Internal Medicine

## 2017-07-14 ENCOUNTER — Ambulatory Visit (INDEPENDENT_AMBULATORY_CARE_PROVIDER_SITE_OTHER): Payer: Medicare HMO | Admitting: Internal Medicine

## 2017-07-14 VITALS — BP 112/58 | HR 87 | Ht 71.0 in | Wt 276.0 lb

## 2017-07-14 DIAGNOSIS — I472 Ventricular tachycardia, unspecified: Secondary | ICD-10-CM

## 2017-07-14 DIAGNOSIS — I519 Heart disease, unspecified: Secondary | ICD-10-CM

## 2017-07-14 MED ORDER — FUROSEMIDE 20 MG PO TABS: 20.0000 mg | ORAL_TABLET | Freq: Two times a day (BID) | ORAL | 3 refills | Status: DC

## 2017-07-14 NOTE — Patient Instructions (Addendum)
Medication Instructions:  Your physician recommends that you continue on your current medications as directed. Please refer to the Current Medication list given to you today.   Labwork: None ordered   Testing/Procedures: None ordered   Follow-Up: Remote monitoring is used to monitor your  ICD from home. This monitoring reduces the number of office visits required to check your device to one time per year. It allows [place] to keep an eye on the functioning of your device to ensure it is working properly. You are scheduled for a device check from home on 10/18/17. You may send your transmission at any time that day. If you have a wireless device, the transmission will be sent automatically. After your physician reviews your transmission, you will receive a postcard with your next transmission date.  Your physician wants you to follow-up in: 12 months with Chanetta Marshall, NP You will receive a reminder letter in the mail two months in advance. If you don't receive a letter, please call our office to schedule the follow-up appointment.         If you need a refill on your cardiac medications before your next appointment, please call your pharmacy.

## 2017-07-14 NOTE — Progress Notes (Signed)
PCP: Orpah Melter, MD Primary Cardiologist:  Dr Meda Coffee Primary EP: Dr Rayann Heman  [name] is a 76 y.o. male who presents today for routine electrophysiology followup.  Since last being seen in our clinic, the patient reports doing very well.  Today, he denies symptoms of palpitations, chest pain, shortness of breath,  lower extremity edema, dizziness, presyncope, syncope, or ICD shocks.  The patient is otherwise without complaint today.   Past Medical History:  Diagnosis Date  . Acid reflux   . CAD (coronary artery disease)    PCI to Circ with DES on 04/06/15  . CHF (congestive heart failure) ([place])   . Degenerative disc disease, thoracic   . Hyperlipidemia   . Hyperplastic colon polyp   . LBBB (left bundle branch block)   . 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 Nonischemic cardiomyopathy ([place])    a. s/p STJ CRTD 07/2015  . Obesity   . Ventricular fibrillation ([place])    a. appropriate ICD therapy 02/2017   Past Surgical History:  Procedure Laterality Date  . CARDIAC CATHETERIZATION N/A 04/06/2015   Procedure: Right/Left Heart Cath and Coronary Angiography;  Surgeon: Larey Dresser, MD;  Location: [facility];  Service: Cardiovascular;  Laterality: N/A;  . CARDIAC CATHETERIZATION N/A 04/06/2015   Procedure: Coronary Stent Intervention;  Surgeon: Peter M Martinique, MD;  Location: [facility];  Service: Cardiovascular;  Laterality: N/A;  . CATARACT EXTRACTION W/ INTRAOCULAR LENS  IMPLANT, BILATERAL Bilateral 11/2014  . COLONOSCOPY  2009  . EP IMPLANTABLE DEVICE N/A 08/04/2015   STJ CRTD implanted by Dr Rayann Heman for primary prevention/CHF  . KNEE CARTILAGE SURGERY Left 1960  . SKIN CANCER EXCISION  05/2005; 07/2005   excision of merkel cell  . TONSILLECTOMY      ROS- all systems are reviewed and negative except as per HPI above  Current Outpatient Prescriptions  Medication Sig Dispense Refill    . aspirin 81 MG tablet Take 81 mg by mouth daily.    Marland Kitchen atorvastatin (LIPITOR) 80 MG tablet TAKE 1 TABLET BY MOUTH EVERY DAY 90 tablet 3  . carvedilol (COREG) 12.5 MG tablet Take 0.5 tablets (6.25 mg total) by mouth 2 (two) times daily. 90 tablet 3  . fluorouracil (EFUDEX) 5 % cream Apply 5 application topically daily as needed (Patches on face).   0  . furosemide (LASIX) 20 MG tablet Take 20 mg by mouth 2 (two) times daily.    Marland Kitchen ibuprofen (ADVIL,MOTRIN) 200 MG tablet Take 200 mg by mouth 2 (two) times daily as needed (pain).    Marland Kitchen levothyroxine (SYNTHROID, LEVOTHROID) 50 MCG tablet Take 50 mcg by mouth daily.  5  . losartan (COZAAR) 25 MG tablet Take 1 tablet (25 mg total) by mouth daily. 90 tablet 3  . nitroGLYCERIN (NITROSTAT) 0.4 MG SL tablet Place 1 tablet (0.4 mg total) under the tongue every 5 (five) minutes as needed for chest pain. 25 tablet 6  . POTASSIUM PO Take 550 mg by mouth daily as needed (Potassium supplement).     Marland Kitchen spironolactone (ALDACTONE) 25 MG tablet TAKE 1/2 TABLET BY MOUTH DAILY 45 tablet 3  . terbinafine (LAMISIL) 250 MG tablet Take 250 mg by mouth daily.     No current facility-administered medications for this visit.    Facility-Administered Medications Ordered in Other Visits  Medication Dose Route Frequency Provider Last Rate Last Dose  . adenosine (diagnostic) (ADENOSCAN)  infusion 68.4 mg  0.56 mg/kg Intravenous Once Lelon Perla, MD        Physical Exam: Vitals:   07/14/17 1411  BP: (!) 112/58  Pulse: 87  SpO2: 94%  Weight: 276 lb (125.2 kg)  Height: 5\' 11"  (1.803 m)    GEN- The patient is well appearing, alert and oriented x 3 today.   Head- normocephalic, atraumatic Eyes-  Sclera clear, conjunctiva pink Ears- hearing intact Oropharynx- clear Lungs- Clear to ausculation bilaterally, normal work of breathing Chest- ICD pocket is well healed Heart- Regular rate and rhythm, no murmurs, rubs or gallops, PMI not laterally displaced GI- soft, NT,  ND, + BS Extremities- no clubbing, cyanosis, or edema  ICD interrogation- reviewed in detail today,  See PACEART report ekg 04/10/17 reviewed today   Assessment and Plan:  1.  Chronic systolic dysfunction euvolemic today Stable on an appropriate medical regimen Normal ICD function See Pace Art report No changes today  2. PVCs/VF Well controlled If he has future arrhythmias, would start sotalol Could also consider ablation  [name] Return to see EP NP in 12 months  [name] MD, [facility] 07/14/2017 2:34 PM

## 2017-07-17 LAB — CUP PACEART INCLINIC DEVICE CHECK
Battery Remaining Longevity: 61 mo
HighPow Impedance: 73.125
Implantable Lead Implant Date: 20161011
Implantable Lead Location: 753860
Lead Channel Pacing Threshold Amplitude: 0.75 V
Lead Channel Pacing Threshold Amplitude: 0.75 V
Lead Channel Pacing Threshold Pulse Width: 0.5 ms
Lead Channel Sensing Intrinsic Amplitude: 11.9 mV
Lead Channel Setting Pacing Amplitude: 1.5 V
Lead Channel Setting Pacing Amplitude: 2 V
Lead Channel Setting Pacing Pulse Width: 0.5 ms
Lead Channel Setting Pacing Pulse Width: 0.8 ms
MDC IDC LEAD IMPLANT DT: 20161011
MDC IDC LEAD IMPLANT DT: 20161011
MDC IDC LEAD LOCATION: 753858
MDC IDC LEAD LOCATION: 753860
MDC IDC MSMT LEADCHNL LV IMPEDANCE VALUE: 587.5 Ohm
MDC IDC MSMT LEADCHNL LV PACING THRESHOLD AMPLITUDE: 0.75 V
MDC IDC MSMT LEADCHNL LV PACING THRESHOLD PULSEWIDTH: 0.8 ms
MDC IDC MSMT LEADCHNL RA IMPEDANCE VALUE: 387.5 Ohm
MDC IDC MSMT LEADCHNL RA PACING THRESHOLD PULSEWIDTH: 0.5 ms
MDC IDC MSMT LEADCHNL RA SENSING INTR AMPL: 3 mV
MDC IDC MSMT LEADCHNL RV IMPEDANCE VALUE: 400 Ohm
MDC IDC PG IMPLANT DT: 20161011
MDC IDC PG SERIAL: 7276664
MDC IDC SESS DTM: 20180921182751
MDC IDC SET LEADCHNL RA PACING AMPLITUDE: 2 V
MDC IDC SET LEADCHNL RV SENSING SENSITIVITY: 0.5 mV
MDC IDC STAT BRADY RA PERCENT PACED: 0.74 %
MDC IDC STAT BRADY RV PERCENT PACED: 91 %

## 2017-07-20 ENCOUNTER — Encounter: Payer: Self-pay | Admitting: Adult Health

## 2017-07-20 ENCOUNTER — Ambulatory Visit (INDEPENDENT_AMBULATORY_CARE_PROVIDER_SITE_OTHER): Payer: Medicare HMO | Admitting: Adult Health

## 2017-07-20 DIAGNOSIS — G4733 Obstructive sleep apnea (adult) (pediatric): Secondary | ICD-10-CM | POA: Diagnosis not present

## 2017-07-20 NOTE — Patient Instructions (Signed)
Wear CPAP At bedtime  For at least 4-6 hr each night Keep up good work .  Do not drive if sleepy.  Work on weight loss.  Follow up Dr. Elsworth Soho  In 1 year and As needed

## 2017-07-20 NOTE — Assessment & Plan Note (Signed)
Well controlled on CPAP At bedtime    Plan  Patient Instructions  Wear CPAP At bedtime  For at least 4-6 hr each night Keep up good work .  Do not drive if sleepy.  Work on weight loss.  Follow up Dr. Elsworth Soho  In 1 year and As needed

## 2017-07-20 NOTE — Progress Notes (Signed)
@Patient  ID: [name], male    DOB: [birth year], 76 y.o.   MRN: [id number]  Chief Complaint  Patient presents with  . 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  He chronic systolic CHF and LBBB s/p AICD . 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  He smoked less than 10 pack years before he quit in 1985  TEST /Events  PSG >> Moderate OSA 21/hour was noted. Central apneas emerged on CPAP titration 08/2015 placed on Auto CPAP 10-18 cm , medium fullface mask, humidity   07/20/2017 Follow up : OSA  Pt returns for yearly follow up for sleep apnea. He says he doing well on CPAP . Feels rested with no significant daytime sleepiness. Wears it each night . Discussed wt loss.  Download shows excellent compliance with avg usage at 6 hr . AHI 4.3 . avg pressure 14. Min leaks.  Says he is doing well overall . Does need new supplies , has not being getting regular supplies.     Allergies  Allergen Reactions  . Tape Other (See Comments)    Rash    Immunization History  Administered Date(s) Administered  . Influenza,inj,Quad PF,6+ Mos 07/21/2016  . Influenza-Unspecified 07/09/2015  . Pneumococcal-Unspecified 07/09/2015    Past Medical History:  Diagnosis Date  . Acid reflux   . CAD (coronary artery disease)    PCI to Circ with DES on 04/06/15  . CHF (congestive heart failure) ([place])   . Degenerative disc disease, thoracic   . Hyperlipidemia   . Hyperplastic colon polyp   . LBBB (left bundle branch block)   . Merkel cell cancer ([place]) 05/2005; 07-2005   s/p gluteal resection, lymph node dissection, chemo/ radiation  . Nonischemic cardiomyopathy ([place])    a. s/p STJ CRTD 07/2015  . Obesity   . Ventricular fibrillation ([place])    a. appropriate ICD therapy 02/2017    Tobacco  History: History  Smoking Status  . Former Smoker  . Packs/day: 0.25  . Years: 15.00  . Types: Cigarettes  . Quit date: 10/25/1983  Smokeless Tobacco  . Never Used   Counseling given: Not Answered   Outpatient Encounter Prescriptions as of 07/20/2017  Medication Sig  . aspirin 81 MG tablet Take 81 mg by mouth daily.  Marland Kitchen atorvastatin (LIPITOR) 80 MG tablet TAKE 1 TABLET BY MOUTH EVERY DAY  . carvedilol (COREG) 12.5 MG tablet Take 0.5 tablets (6.25 mg total) by mouth 2 (two) times daily.  . fluorouracil (EFUDEX) 5 % cream Apply 5 application topically daily as needed (Patches on face).   . furosemide (LASIX) 20 MG tablet Take 1 tablet (20 mg total) by mouth 2 (two) times daily.  Marland Kitchen ibuprofen (ADVIL,MOTRIN) 200 MG tablet Take 200 mg by mouth 2 (two) times daily as needed (pain).  Marland Kitchen levothyroxine (SYNTHROID, LEVOTHROID) 50 MCG tablet Take 50 mcg by mouth daily.  Marland Kitchen losartan (COZAAR) 25 MG tablet Take 1 tablet (25 mg total) by mouth daily.  . nitroGLYCERIN (NITROSTAT) 0.4 MG SL tablet Place 1 tablet (0.4 mg total) under the tongue every 5 (five) minutes as needed for chest pain.  Marland Kitchen POTASSIUM PO Take 550 mg by mouth daily as needed (Potassium supplement).   Marland Kitchen spironolactone (ALDACTONE) 25 MG tablet TAKE 1/2 TABLET BY MOUTH DAILY  . terbinafine (LAMISIL)  250 MG tablet Take 250 mg by mouth daily.   Facility-Administered Encounter Medications as of 07/20/2017  Medication  . adenosine (diagnostic) (ADENOSCAN) infusion 68.4 mg     Review of Systems  Constitutional:   No  weight loss, night sweats,  Fevers, chills, fatigue, or  lassitude.  HEENT:   No headaches,  Difficulty swallowing,  Tooth/dental problems, or  Sore throat,                No sneezing, itching, ear ache, nasal congestion, post nasal drip,   CV:  No chest pain,  Orthopnea, PND, swelling in lower extremities, anasarca, dizziness, palpitations, syncope.   GI  No heartburn, indigestion, abdominal pain, nausea, vomiting,  diarrhea, change in bowel habits, loss of appetite, bloody stools.   Resp: No shortness of breath with exertion or at rest.  No excess mucus, no productive cough,  No non-productive cough,  No coughing up of blood.  No change in color of mucus.  No wheezing.  No chest wall deformity  Skin: no rash or lesions.  GU: no dysuria, change in color of urine, no urgency or frequency.  No flank pain, no hematuria   MS:  No joint pain or swelling.  No decreased range of motion.  No back pain.    Physical Exam  BP 106/73   Pulse 97   Ht 5\' 11"  (1.803 m)   Wt 269 lb (122 kg)   SpO2 97%   BMI 37.52 kg/m   GEN: A/Ox3; pleasant , NAD, obese    HEENT:  [place]/AT,  EACs-clear, TMs-wnl, NOSE-clear, THROAT-clear, no lesions, no postnasal drip or exudate noted. Class 3 MP airway   NECK:  Supple w/ fair ROM; no JVD; normal carotid impulses w/o bruits; no thyromegaly or nodules palpated; no lymphadenopathy.    RESP  Clear  P & A; w/o, wheezes/ rales/ or rhonchi. no accessory muscle use, no dullness to percussion  CARD:  RRR, no m/r/g, tr  peripheral edema, pulses intact, no cyanosis or clubbing.  GI:   Soft & nt; nml bowel sounds; no organomegaly or masses detected.   Musco: Warm bil, no deformities or joint swelling noted.   Neuro: alert, no focal deficits noted.    Skin: Warm, no lesions or rashes    Lab Results:   BMET  BNP No results found for: BNP  Imaging: No results found.   Assessment & Plan:   OSA (obstructive sleep apnea) Well controlled on CPAP At bedtime    Plan  Patient Instructions  Wear CPAP At bedtime  For at least 4-6 hr each night Keep up good work .  Do not drive if sleepy.  Work on weight loss.  Follow up Dr. Elsworth Soho  In 1 year and As needed      Morbid obesity Wt loss      Rexene Edison, NP 07/20/2017

## 2017-07-20 NOTE — Assessment & Plan Note (Signed)
Wt loss  

## 2017-07-21 NOTE — Addendum Note (Signed)
Addended by: Parke Poisson E on: 07/21/2017 10:51 AM   Modules accepted: Orders

## 2017-09-07 DIAGNOSIS — Z961 Presence of intraocular lens: Secondary | ICD-10-CM | POA: Diagnosis not present

## 2017-09-07 DIAGNOSIS — H35363 Drusen (degenerative) of macula, bilateral: Secondary | ICD-10-CM | POA: Diagnosis not present

## 2017-09-07 DIAGNOSIS — H26493 Other secondary cataract, bilateral: Secondary | ICD-10-CM | POA: Diagnosis not present

## 2017-09-07 DIAGNOSIS — H40013 Open angle with borderline findings, low risk, bilateral: Secondary | ICD-10-CM | POA: Diagnosis not present

## 2017-10-18 ENCOUNTER — Telehealth: Payer: Self-pay | Admitting: Cardiology

## 2017-10-18 ENCOUNTER — Encounter: Payer: Medicare HMO | Admitting: *Deleted

## 2017-10-18 NOTE — Telephone Encounter (Signed)
Spoke with pt and reminded pt of remote transmission that is due today. Pt verbalized understanding.   

## 2017-11-02 DIAGNOSIS — E039 Hypothyroidism, unspecified: Secondary | ICD-10-CM | POA: Diagnosis not present

## 2017-11-02 DIAGNOSIS — I509 Heart failure, unspecified: Secondary | ICD-10-CM | POA: Diagnosis not present

## 2017-11-02 DIAGNOSIS — R7303 Prediabetes: Secondary | ICD-10-CM | POA: Diagnosis not present

## 2017-11-02 DIAGNOSIS — I10 Essential (primary) hypertension: Secondary | ICD-10-CM | POA: Diagnosis not present

## 2017-11-02 DIAGNOSIS — E7800 Pure hypercholesterolemia, unspecified: Secondary | ICD-10-CM | POA: Diagnosis not present

## 2017-11-02 DIAGNOSIS — Z6841 Body Mass Index (BMI) 40.0 and over, adult: Secondary | ICD-10-CM | POA: Diagnosis not present

## 2017-11-02 DIAGNOSIS — I2510 Atherosclerotic heart disease of native coronary artery without angina pectoris: Secondary | ICD-10-CM | POA: Diagnosis not present

## 2017-11-02 DIAGNOSIS — K219 Gastro-esophageal reflux disease without esophagitis: Secondary | ICD-10-CM | POA: Diagnosis not present

## 2017-11-13 ENCOUNTER — Ambulatory Visit (INDEPENDENT_AMBULATORY_CARE_PROVIDER_SITE_OTHER): Payer: Medicare HMO | Admitting: *Deleted

## 2017-11-13 DIAGNOSIS — I428 Other cardiomyopathies: Secondary | ICD-10-CM | POA: Diagnosis not present

## 2017-11-13 NOTE — Progress Notes (Signed)
Remote ICD transmission.   

## 2017-11-14 ENCOUNTER — Encounter: Payer: Self-pay | Admitting: Cardiology

## 2017-11-21 ENCOUNTER — Telehealth: Payer: Self-pay | Admitting: Pulmonary Disease

## 2017-11-21 NOTE — Telephone Encounter (Signed)
Pt is returning call. Cb is [phone].

## 2017-11-21 NOTE — Telephone Encounter (Signed)
At end of call, patient states it is [facility] on [address] that he wants to switch to.

## 2017-11-21 NOTE — Telephone Encounter (Signed)
Spoke with [facility], they state he received his CPAP machine in 08/2015 and he is not eligible for a new CPAP machine. [facility] stated they could not take him on because [facility] has already received payment for the CPAP and he would have to wait until he is eligible for another CPAP.   ATC pt, no answer. Left message for pt to call back.

## 2017-11-21 NOTE — Telephone Encounter (Signed)
Spoke with pt, he states he wants to switch his DME company from [place] to [facility]. He had a CPAP that he had 3 years ago but his machine was broken. He sent it into the manufacturer and after 3 months they stated he would have to pay 230 dollars to have it repaired. He does not want the machine repaired and doesn't want to use [facility] since they have moved their office to [place].  He wants to switch to [facility] off [address] and [place]. RA can we send a new order for CPAP machine? Please advise.

## 2017-11-21 NOTE — Telephone Encounter (Signed)
Okay to switch. Please  confirm that [facility] can help him with this first

## 2017-11-22 NOTE — Telephone Encounter (Signed)
Spoke with pt. He is aware of this information. States that he is going to speak to [facility] and see what it would cost to buy the CPAP machine with insurance payment. Pt will get back with [place] if he wants [place] to send an order to [facility]. Nothing further was needed.

## 2017-11-30 DIAGNOSIS — G4733 Obstructive sleep apnea (adult) (pediatric): Secondary | ICD-10-CM | POA: Diagnosis not present

## 2017-12-06 LAB — CUP PACEART REMOTE DEVICE CHECK
Date Time Interrogation Session: 20190213205727
Implantable Lead Implant Date: 20161011
Implantable Lead Implant Date: 20161011
Implantable Lead Location: 753858
Implantable Lead Location: 753860
Lead Channel Setting Pacing Amplitude: 1.5 V
Lead Channel Setting Pacing Amplitude: 2 V
Lead Channel Setting Pacing Pulse Width: 0.8 ms
Lead Channel Setting Sensing Sensitivity: 0.5 mV
MDC IDC LEAD IMPLANT DT: 20161011
MDC IDC LEAD LOCATION: 753860
MDC IDC PG IMPLANT DT: 20161011
MDC IDC PG SERIAL: 7276664
MDC IDC SET LEADCHNL RA PACING AMPLITUDE: 2 V
MDC IDC SET LEADCHNL RV PACING PULSEWIDTH: 0.5 ms

## 2018-01-29 ENCOUNTER — Other Ambulatory Visit: Payer: Self-pay | Admitting: Cardiology

## 2018-01-29 DIAGNOSIS — I5022 Chronic systolic (congestive) heart failure: Secondary | ICD-10-CM

## 2018-01-29 DIAGNOSIS — I519 Heart disease, unspecified: Secondary | ICD-10-CM

## 2018-01-29 DIAGNOSIS — R059 Cough, unspecified: Secondary | ICD-10-CM

## 2018-01-29 DIAGNOSIS — R05 Cough: Secondary | ICD-10-CM

## 2018-01-29 DIAGNOSIS — I2510 Atherosclerotic heart disease of native coronary artery without angina pectoris: Secondary | ICD-10-CM

## 2018-01-29 DIAGNOSIS — I428 Other cardiomyopathies: Secondary | ICD-10-CM

## 2018-01-29 DIAGNOSIS — I447 Left bundle-branch block, unspecified: Secondary | ICD-10-CM

## 2018-01-29 MED ORDER — LOSARTAN POTASSIUM 25 MG PO TABS
25.0000 mg | ORAL_TABLET | Freq: Every day | ORAL | 0 refills | Status: DC
Start: 2018-01-29 — End: 2018-05-07

## 2018-01-29 MED ORDER — CARVEDILOL 12.5 MG PO TABS: 6.2500 mg | ORAL_TABLET | Freq: Two times a day (BID) | ORAL | 0 refills | Status: DC

## 2018-02-12 ENCOUNTER — Ambulatory Visit (INDEPENDENT_AMBULATORY_CARE_PROVIDER_SITE_OTHER): Payer: Medicare HMO | Admitting: *Deleted

## 2018-02-12 DIAGNOSIS — I428 Other cardiomyopathies: Secondary | ICD-10-CM

## 2018-02-12 NOTE — Progress Notes (Signed)
Remote ICD transmission.   

## 2018-02-13 ENCOUNTER — Encounter: Payer: Self-pay | Admitting: Cardiology

## 2018-02-13 LAB — CUP PACEART REMOTE DEVICE CHECK
Implantable Lead Implant Date: 20161011
Implantable Lead Implant Date: 20161011
Implantable Lead Location: 753858
Implantable Lead Location: 753860
Implantable Lead Location: 753860
MDC IDC LEAD IMPLANT DT: 20161011
MDC IDC PG IMPLANT DT: 20161011
MDC IDC PG SERIAL: 7276664
MDC IDC SESS DTM: 20190423160337

## 2018-02-16 ENCOUNTER — Other Ambulatory Visit: Payer: Self-pay | Admitting: Cardiology

## 2018-02-16 DIAGNOSIS — E785 Hyperlipidemia, unspecified: Secondary | ICD-10-CM

## 2018-02-16 DIAGNOSIS — I5022 Chronic systolic (congestive) heart failure: Secondary | ICD-10-CM

## 2018-02-16 DIAGNOSIS — I519 Heart disease, unspecified: Secondary | ICD-10-CM

## 2018-02-16 DIAGNOSIS — I428 Other cardiomyopathies: Secondary | ICD-10-CM

## 2018-02-16 DIAGNOSIS — R05 Cough: Secondary | ICD-10-CM

## 2018-02-16 DIAGNOSIS — R059 Cough, unspecified: Secondary | ICD-10-CM

## 2018-02-16 DIAGNOSIS — I447 Left bundle-branch block, unspecified: Secondary | ICD-10-CM

## 2018-02-16 DIAGNOSIS — I2510 Atherosclerotic heart disease of native coronary artery without angina pectoris: Secondary | ICD-10-CM

## 2018-02-16 MED ORDER — SPIRONOLACTONE 25 MG PO TABS: 12.5000 mg | ORAL_TABLET | Freq: Every day | ORAL | 0 refills | Status: DC

## 2018-02-16 NOTE — Addendum Note (Signed)
Addended by: Juventino Slovak on: 02/16/2018 11:27 AM   Modules accepted: Orders

## 2018-02-23 ENCOUNTER — Other Ambulatory Visit: Payer: Self-pay | Admitting: Cardiology

## 2018-02-23 DIAGNOSIS — I2510 Atherosclerotic heart disease of native coronary artery without angina pectoris: Secondary | ICD-10-CM

## 2018-02-23 DIAGNOSIS — R059 Cough, unspecified: Secondary | ICD-10-CM

## 2018-02-23 DIAGNOSIS — I428 Other cardiomyopathies: Secondary | ICD-10-CM

## 2018-02-23 DIAGNOSIS — E785 Hyperlipidemia, unspecified: Secondary | ICD-10-CM

## 2018-02-23 DIAGNOSIS — R05 Cough: Secondary | ICD-10-CM

## 2018-02-23 DIAGNOSIS — I5022 Chronic systolic (congestive) heart failure: Secondary | ICD-10-CM

## 2018-02-23 DIAGNOSIS — I519 Heart disease, unspecified: Secondary | ICD-10-CM

## 2018-02-23 DIAGNOSIS — I447 Left bundle-branch block, unspecified: Secondary | ICD-10-CM

## 2018-04-02 DIAGNOSIS — L821 Other seborrheic keratosis: Secondary | ICD-10-CM | POA: Diagnosis not present

## 2018-04-02 DIAGNOSIS — D485 Neoplasm of uncertain behavior of skin: Secondary | ICD-10-CM | POA: Diagnosis not present

## 2018-04-02 DIAGNOSIS — C44319 Basal cell carcinoma of skin of other parts of face: Secondary | ICD-10-CM | POA: Diagnosis not present

## 2018-04-02 DIAGNOSIS — L570 Actinic keratosis: Secondary | ICD-10-CM | POA: Diagnosis not present

## 2018-04-05 ENCOUNTER — Other Ambulatory Visit: Payer: Self-pay

## 2018-04-05 DIAGNOSIS — I447 Left bundle-branch block, unspecified: Secondary | ICD-10-CM

## 2018-04-05 DIAGNOSIS — I2510 Atherosclerotic heart disease of native coronary artery without angina pectoris: Secondary | ICD-10-CM

## 2018-04-05 DIAGNOSIS — I519 Heart disease, unspecified: Secondary | ICD-10-CM

## 2018-04-05 DIAGNOSIS — R059 Cough, unspecified: Secondary | ICD-10-CM

## 2018-04-05 DIAGNOSIS — I5022 Chronic systolic (congestive) heart failure: Secondary | ICD-10-CM

## 2018-04-05 DIAGNOSIS — I428 Other cardiomyopathies: Secondary | ICD-10-CM

## 2018-04-05 DIAGNOSIS — R05 Cough: Secondary | ICD-10-CM

## 2018-04-05 DIAGNOSIS — E785 Hyperlipidemia, unspecified: Secondary | ICD-10-CM

## 2018-04-05 MED ORDER — FUROSEMIDE 20 MG PO TABS: 20.0000 mg | ORAL_TABLET | Freq: Two times a day (BID) | ORAL | 3 refills | Status: DC

## 2018-04-14 ENCOUNTER — Other Ambulatory Visit: Payer: Self-pay | Admitting: Internal Medicine

## 2018-04-18 ENCOUNTER — Other Ambulatory Visit: Payer: Self-pay | Admitting: Cardiology

## 2018-04-18 DIAGNOSIS — R059 Cough, unspecified: Secondary | ICD-10-CM

## 2018-04-18 DIAGNOSIS — R05 Cough: Secondary | ICD-10-CM

## 2018-04-18 DIAGNOSIS — E785 Hyperlipidemia, unspecified: Secondary | ICD-10-CM

## 2018-04-18 DIAGNOSIS — I428 Other cardiomyopathies: Secondary | ICD-10-CM

## 2018-04-18 DIAGNOSIS — I447 Left bundle-branch block, unspecified: Secondary | ICD-10-CM

## 2018-04-18 DIAGNOSIS — I5022 Chronic systolic (congestive) heart failure: Secondary | ICD-10-CM

## 2018-04-18 DIAGNOSIS — I2510 Atherosclerotic heart disease of native coronary artery without angina pectoris: Secondary | ICD-10-CM

## 2018-04-18 DIAGNOSIS — I519 Heart disease, unspecified: Secondary | ICD-10-CM

## 2018-04-18 NOTE — Telephone Encounter (Signed)
Outpatient Medication Detail    Disp Refills Start End   furosemide (LASIX) 20 MG tablet 60 tablet 3 04/05/2018    Sig - Route: Take 1 tablet (20 mg total) by mouth 2 (two) times daily. Please make overdue yearly appt with Dr. Meda Coffee before anymore refills.1st attempt - Oral   Sent to pharmacy as: furosemide (LASIX) 20 MG tablet   Notes to Pharmacy: Please call our office to schedule an overdue yearly appointment with Dr. Meda Coffee before anymore refills. [phone]. Thank you 1st attempt   E-Prescribing Status: Receipt confirmed by pharmacy (04/05/2018 4:05 PM EDT)    Pharmacy   [id number] - [place], [place] [address]

## 2018-04-24 ENCOUNTER — Other Ambulatory Visit: Payer: Self-pay | Admitting: Cardiology

## 2018-04-24 DIAGNOSIS — I5022 Chronic systolic (congestive) heart failure: Secondary | ICD-10-CM

## 2018-04-24 DIAGNOSIS — I428 Other cardiomyopathies: Secondary | ICD-10-CM

## 2018-04-24 DIAGNOSIS — I2510 Atherosclerotic heart disease of native coronary artery without angina pectoris: Secondary | ICD-10-CM

## 2018-04-25 ENCOUNTER — Other Ambulatory Visit: Payer: Self-pay | Admitting: Cardiology

## 2018-04-25 DIAGNOSIS — R059 Cough, unspecified: Secondary | ICD-10-CM

## 2018-04-25 DIAGNOSIS — I519 Heart disease, unspecified: Secondary | ICD-10-CM

## 2018-04-25 DIAGNOSIS — I447 Left bundle-branch block, unspecified: Secondary | ICD-10-CM

## 2018-04-25 DIAGNOSIS — R05 Cough: Secondary | ICD-10-CM

## 2018-04-25 DIAGNOSIS — I2510 Atherosclerotic heart disease of native coronary artery without angina pectoris: Secondary | ICD-10-CM

## 2018-04-25 DIAGNOSIS — E785 Hyperlipidemia, unspecified: Secondary | ICD-10-CM

## 2018-04-25 DIAGNOSIS — I5022 Chronic systolic (congestive) heart failure: Secondary | ICD-10-CM

## 2018-04-25 DIAGNOSIS — I428 Other cardiomyopathies: Secondary | ICD-10-CM

## 2018-04-25 MED ORDER — CARVEDILOL 12.5 MG PO TABS: 6.2500 mg | ORAL_TABLET | Freq: Two times a day (BID) | ORAL | 0 refills | Status: DC

## 2018-05-04 DIAGNOSIS — G4733 Obstructive sleep apnea (adult) (pediatric): Secondary | ICD-10-CM | POA: Diagnosis not present

## 2018-05-07 ENCOUNTER — Other Ambulatory Visit: Payer: Self-pay | Admitting: Cardiology

## 2018-05-07 DIAGNOSIS — I519 Heart disease, unspecified: Secondary | ICD-10-CM

## 2018-05-07 DIAGNOSIS — I447 Left bundle-branch block, unspecified: Secondary | ICD-10-CM

## 2018-05-07 DIAGNOSIS — I2510 Atherosclerotic heart disease of native coronary artery without angina pectoris: Secondary | ICD-10-CM

## 2018-05-07 DIAGNOSIS — I5022 Chronic systolic (congestive) heart failure: Secondary | ICD-10-CM

## 2018-05-07 DIAGNOSIS — R05 Cough: Secondary | ICD-10-CM

## 2018-05-07 DIAGNOSIS — R059 Cough, unspecified: Secondary | ICD-10-CM

## 2018-05-07 DIAGNOSIS — I428 Other cardiomyopathies: Secondary | ICD-10-CM

## 2018-05-07 DIAGNOSIS — E785 Hyperlipidemia, unspecified: Secondary | ICD-10-CM

## 2018-05-07 MED ORDER — CARVEDILOL 12.5 MG PO TABS: 6.2500 mg | ORAL_TABLET | Freq: Two times a day (BID) | ORAL | 0 refills | Status: DC

## 2018-05-07 MED ORDER — ATORVASTATIN CALCIUM 80 MG PO TABS: 80.0000 mg | ORAL_TABLET | Freq: Every day | ORAL | 0 refills | Status: AC

## 2018-05-07 MED ORDER — SPIRONOLACTONE 25 MG PO TABS: 12.5000 mg | ORAL_TABLET | Freq: Every day | ORAL | 0 refills | Status: DC

## 2018-05-07 MED ORDER — FUROSEMIDE 20 MG PO TABS: 20.0000 mg | ORAL_TABLET | Freq: Two times a day (BID) | ORAL | 0 refills | Status: DC

## 2018-05-07 MED ORDER — LOSARTAN POTASSIUM 25 MG PO TABS: 25.0000 mg | ORAL_TABLET | Freq: Every day | ORAL | 0 refills | Status: DC

## 2018-05-07 NOTE — Telephone Encounter (Signed)
New Message     *STAT* If patient is at the pharmacy, call can be transferred to refill team.   1. Which medications need to be refilled? (please list name of each medication and dose if known) furosemide (LASIX) 20 MG tablet, carvedilol (COREG) 12.5 MG tablet, spironolactone (ALDACTONE) 25 MG tablet, atorvastatin (LIPITOR) 80 MG tablet, and losartan (COZAAR) 25 MG tablet 2. Which pharmacy/location (including street and city if local pharmacy) is medication to be sent to? [id number] - [place], [place] [address]  3. Do they need a 30 day or 90 day supply? [place]

## 2018-05-07 NOTE — Telephone Encounter (Signed)
Pt's medication was sent to pharmacy as requested. Confirmation received. °

## 2018-05-14 ENCOUNTER — Telehealth: Payer: Self-pay

## 2018-05-14 ENCOUNTER — Encounter: Payer: Medicare HMO | Admitting: *Deleted

## 2018-05-14 NOTE — Telephone Encounter (Signed)
LMOVM reminding pt to send remote transmission.   

## 2018-05-16 ENCOUNTER — Encounter: Payer: Self-pay | Admitting: Cardiology

## 2018-05-18 ENCOUNTER — Ambulatory Visit (INDEPENDENT_AMBULATORY_CARE_PROVIDER_SITE_OTHER): Payer: Medicare HMO | Admitting: *Deleted

## 2018-05-18 DIAGNOSIS — I428 Other cardiomyopathies: Secondary | ICD-10-CM | POA: Diagnosis not present

## 2018-05-21 ENCOUNTER — Encounter: Payer: Self-pay | Admitting: Cardiology

## 2018-05-21 NOTE — Progress Notes (Signed)
Remote ICD transmission.   

## 2018-05-28 DIAGNOSIS — I890 Lymphedema, not elsewhere classified: Secondary | ICD-10-CM | POA: Diagnosis not present

## 2018-05-28 DIAGNOSIS — G4700 Insomnia, unspecified: Secondary | ICD-10-CM | POA: Diagnosis not present

## 2018-05-28 DIAGNOSIS — E039 Hypothyroidism, unspecified: Secondary | ICD-10-CM | POA: Diagnosis not present

## 2018-05-28 DIAGNOSIS — I10 Essential (primary) hypertension: Secondary | ICD-10-CM | POA: Diagnosis not present

## 2018-05-28 DIAGNOSIS — I509 Heart failure, unspecified: Secondary | ICD-10-CM | POA: Diagnosis not present

## 2018-05-28 DIAGNOSIS — E7800 Pure hypercholesterolemia, unspecified: Secondary | ICD-10-CM | POA: Diagnosis not present

## 2018-05-28 DIAGNOSIS — Z1211 Encounter for screening for malignant neoplasm of colon: Secondary | ICD-10-CM | POA: Diagnosis not present

## 2018-05-28 DIAGNOSIS — I2510 Atherosclerotic heart disease of native coronary artery without angina pectoris: Secondary | ICD-10-CM | POA: Diagnosis not present

## 2018-05-28 DIAGNOSIS — I4901 Ventricular fibrillation: Secondary | ICD-10-CM | POA: Diagnosis not present

## 2018-05-28 DIAGNOSIS — G4733 Obstructive sleep apnea (adult) (pediatric): Secondary | ICD-10-CM | POA: Diagnosis not present

## 2018-06-01 ENCOUNTER — Encounter: Payer: Self-pay | Admitting: Rehabilitation

## 2018-06-01 ENCOUNTER — Ambulatory Visit: Payer: Medicare HMO | Attending: Family Medicine | Admitting: Rehabilitation

## 2018-06-01 ENCOUNTER — Other Ambulatory Visit: Payer: Self-pay

## 2018-06-01 DIAGNOSIS — C4A9 Merkel cell carcinoma, unspecified: Secondary | ICD-10-CM | POA: Diagnosis not present

## 2018-06-01 DIAGNOSIS — I890 Lymphedema, not elsewhere classified: Secondary | ICD-10-CM | POA: Diagnosis not present

## 2018-06-01 DIAGNOSIS — Z483 Aftercare following surgery for neoplasm: Secondary | ICD-10-CM | POA: Diagnosis not present

## 2018-06-01 NOTE — Therapy (Signed)
[place], [place], [address] Phone: [phone]   Fax:  [phone]  Physical Therapy Evaluation  Patient Details  Name: [name] MRN: [id number] Date of Birth: 12-12-1940 Referring Provider: Orpah Melter, MD   Encounter Date: 06/01/2018  PT End of Session - 06/01/18 1443    Visit Number  1    Number of Visits  24    Date for PT Re-Evaluation  07/13/18    PT Start Time  1103    PT Stop Time  1158    PT Time Calculation (min)  55 min    Activity Tolerance  Patient tolerated treatment well    Behavior During Therapy  [facility] for tasks assessed/performed       Past Medical History:  Diagnosis Date  . Acid reflux   . CAD (coronary artery disease)    PCI to Circ with DES on 04/06/15  . CHF (congestive heart failure) ([place])   . Degenerative disc disease, thoracic   . Hyperlipidemia   . Hyperplastic colon polyp   . LBBB (left bundle branch block)   . Merkel cell cancer ([place]) 05/2005; 07-2005   s/p gluteal resection, lymph node dissection, chemo/ radiation  . Nonischemic cardiomyopathy ([place])    a. s/p STJ CRTD 07/2015  . Obesity   . Ventricular fibrillation ([place])    a. appropriate ICD therapy 02/2017    Past Surgical History:  Procedure Laterality Date  . CARDIAC CATHETERIZATION N/A 04/06/2015   Procedure: Right/Left Heart Cath and Coronary Angiography;  Surgeon: Larey Dresser, MD;  Location: [facility];  Service: Cardiovascular;  Laterality: N/A;  . CARDIAC CATHETERIZATION N/A 04/06/2015   Procedure: Coronary Stent Intervention;  Surgeon: Peter M Martinique, MD;  Location: [facility];  Service: Cardiovascular;  Laterality: N/A;  . CATARACT EXTRACTION W/ INTRAOCULAR LENS  IMPLANT, BILATERAL Bilateral 11/2014  . COLONOSCOPY  2009  . EP IMPLANTABLE DEVICE N/A 08/04/2015   STJ CRTD implanted by Dr Rayann Heman for primary prevention/CHF  . KNEE CARTILAGE SURGERY Left 1960  . SKIN CANCER  EXCISION  05/2005; 07/2005   excision of merkel cell  . TONSILLECTOMY      There were no vitals filed for this visit.   Subjective Assessment - 06/01/18 1106    Subjective  Has had lymphedema treatment in the past in [facility] where he was wrapped and has gotten compression garments. The garments are Juzo off the shelf and underarmor underwear.  The swelling is in the Rt LE from thigh to foot     Patient is accompained by:  Family member    Pertinent History  Merkel cell tumor removed Lt glute with lymph node invlovement Rt groin and abdomen around 2006.  Pt not remembering if any nodes were positive.  12 weeks of chemotherapy and 5or 6 weeks of radiation , Bil bad hips, pacemaker    Limitations  --   reports only walking due to hips   Patient Stated Goals  get better garments, treat swelling again    Currently in Pain?  No/denies         [facility] PT Assessment - 06/01/18 0001      Assessment   Medical Diagnosis  Rt LE lymphedema after lymph node dissection in Rt groin    Referring Provider  Orpah Melter, MD    Onset Date/Surgical Date  10/24/04    Hand Dominance  Right    Prior Therapy  yes not here  Precautions   Precaution Comments  cancer      Restrictions   Weight Bearing Restrictions  No      Balance Screen   Has the patient fallen in the past 6 months  No    Has the patient had a decrease in activity level because of a fear of falling?   No    Is the patient reluctant to leave their home because of a fear of falling?   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    Leisure  does not exercise      Cognition   Overall Cognitive Status  Within Functional Limits for tasks assessed      Coordination   Gross Motor  Movements are Fluid and Coordinated  Yes      Ambulation/Gait   Gait Comments  reports some Rt foot drag occasionally due to hip pain        LYMPHEDEMA/ONCOLOGY QUESTIONNAIRE - 06/01/18 1119      Type   Cancer Type  Merkel cell carcinoma Lt glute      Surgeries   Other Surgery Date  --   Lt glute resection and Rt inguinal node bed dissection   Number Lymph Nodes Removed  --   unsure     Date Lymphedema/Swelling Started   Date  --   after radiation around 2006     Treatment   Past Chemotherapy Treatment  Yes    Past Radiation Treatment  Yes      What other symptoms do you have   Are you Having Heaviness or Tightness  Yes    Are you having Pain  No      Lymphedema Assessments   Lymphedema Assessments  Lower extremities      Right Lower Extremity Lymphedema   20 cm Proximal to Suprapatella  65.2 cm    10 cm Proximal to Suprapatella  59.8 cm    At Midpatella/Popliteal Crease  46.5 cm    30 cm Proximal to Floor at Lateral Plantar Foot  46 cm    20 cm Proximal to Floor at Lateral Plantar Foot  43.4 1    10  cm Proximal to Floor at Lateral Malleoli  33.8 cm    Circumference of ankle/heel  30 cm.    5 cm Proximal to 1st MTP Joint  25.9 cm    Across MTP Joint  25.5 cm    Around Proximal Great Toe  10 cm      Left Lower Extremity Lymphedema   20 cm Proximal to Suprapatella  63.2 cm    10 cm Proximal to Suprapatella  54.8 cm    At Midpatella/Popliteal Crease  45.5 cm    30 cm Proximal to Floor at Lateral Plantar Foot  43.8 cm    20 cm Proximal to Floor at Lateral Plantar Foot  39 cm    10 cm Proximal to Floor at Lateral Malleoli  28.5 cm    Circumference of ankle/heel  25.7 cm.    5 cm Proximal to 1st MTP Joint  25.8 cm    Across MTP Joint  26.2 cm    Around Proximal Great Toe  10 cm             Objective measurements completed on examination: See above findings.              PT Education - 06/01/18 1443    Education Details  POC, treatment, basic  compression options    Person(s) Educated  Patient;Spouse    Methods  Explanation    Comprehension  Verbalized understanding          PT Long Term Goals - 06/01/18 1452      PT LONG TERM GOAL #1   Title  Pt will reduce Rt LE to the following:  20cm proximal to patella: 63, 10cm from patella: 54, 20cm proximal from lateral malleolus: 39    Baseline  65, 59, 43    Time  6    Period  Weeks    Status  New    Target Date  07/13/18      PT LONG TERM GOAL #2   Title  Pt will obtain appropriate compression garments for continued containment     Time  6    Period  Weeks    Status  New    Target Date  07/13/18      PT LONG TERM GOAL #3   Title  Pt will be knowledgeable about self MLD or use of the pump to enhance compression effects     Time  6    Period  Weeks    Status  New    Target Date  07/13/18             Plan - 06/01/18 1445    Clinical Impression Statement  Mr. Lefebre presents to PT for second round of CDT.  He was seen previously in [facility] (unsure when) for the same.  He reports he is not very compliant due to his garments being uncomfortable and hot.  He currently has Juzo off the shelf circular knit with under armour shorts but they are too long and roll up and down.  He has significant swelling in the thigh and lower leg but not in the toes.  He is agreeable to compression bandaging and transition into new garments.      History and Personal Factors relevant to plan of care:  Merkel cell tumor removed Lt glute with lymph node invlovement Rt groin and abdomen around 2006. Pt not remembering if any nodes were positive. 12 weeks of chemotherapy and 5or 6 weeks of radiation , Bil bad hips, pacemaker    Clinical Presentation  Evolving    Clinical Presentation due to:  new return of swelling    Clinical Decision Making  Moderate    Rehab Potential  Good    Clinical Impairments Affecting Rehab Potential  compliance    PT Frequency  3x / week    PT Duration  6  weeks    PT Treatment/Interventions  ADLs/Self Care Home Management;DME Instruction;Therapeutic exercise;Prosthetic Training;Manual lymph drainage;Compression bandaging    PT Next Visit Plan  begin CDT, bandaging of Rt LE full leg, MLD, remedial exercises, insurance information from pump which has been sent by PT?    Recommended Other Services  discussed briefly a combination garment like knee high with capri 1/2 leg, may want pump    Consulted and Agree with Plan of Care  Patient;Family member/caregiver       Patient will benefit from skilled therapeutic intervention in order to improve the following deficits and impairments:  Increased edema, Decreased  knowledge of use of DME  Visit Diagnosis: Lymphedema, not elsewhere classified  Merkel cell carcinoma [facility])  Aftercare following surgery for neoplasm     Problem List Patient Active Problem List   Diagnosis Date Noted  . Ventricular tachycardia ([place]) 03/17/2017  . Biventricular ICD (implantable cardioverter-defibrillator) in place 12/14/2016  . Merkel cell carcinoma ([place]) 05/04/2016  . Elevated MCV 08/10/2015  . Cough 05/29/2015  . Hyperlipidemia 05/29/2015  . OSA (obstructive sleep apnea) 05/27/2015  . CAD (coronary artery disease), native coronary artery 04/22/2015  . Chronic systolic CHF (congestive heart failure) ([place]) 04/06/2015  . Nonischemic cardiomyopathy ([place]) 03/16/2015  . Chronic systolic dysfunction of left ventricle 03/16/2015  . LBBB (left bundle branch block) 03/16/2015  . Morbid obesity ([place]) 03/16/2015    [name], PT 06/01/2018, 2:56 PM  [place], [place], [address] Phone: [phone]   Fax:  [phone]  Name: [name] MRN: [id number] Date of Birth: November 30, 1940

## 2018-06-15 ENCOUNTER — Other Ambulatory Visit: Payer: Self-pay | Admitting: Cardiology

## 2018-06-15 DIAGNOSIS — E785 Hyperlipidemia, unspecified: Secondary | ICD-10-CM

## 2018-06-15 DIAGNOSIS — I447 Left bundle-branch block, unspecified: Secondary | ICD-10-CM

## 2018-06-15 DIAGNOSIS — I519 Heart disease, unspecified: Secondary | ICD-10-CM

## 2018-06-15 DIAGNOSIS — R059 Cough, unspecified: Secondary | ICD-10-CM

## 2018-06-15 DIAGNOSIS — R05 Cough: Secondary | ICD-10-CM

## 2018-06-15 DIAGNOSIS — I5022 Chronic systolic (congestive) heart failure: Secondary | ICD-10-CM

## 2018-06-15 DIAGNOSIS — I2510 Atherosclerotic heart disease of native coronary artery without angina pectoris: Secondary | ICD-10-CM

## 2018-06-15 DIAGNOSIS — I428 Other cardiomyopathies: Secondary | ICD-10-CM

## 2018-06-19 DIAGNOSIS — M7062 Trochanteric bursitis, left hip: Secondary | ICD-10-CM | POA: Diagnosis not present

## 2018-06-19 DIAGNOSIS — M7061 Trochanteric bursitis, right hip: Secondary | ICD-10-CM | POA: Diagnosis not present

## 2018-06-20 ENCOUNTER — Ambulatory Visit: Payer: Medicare HMO | Admitting: Rehabilitation

## 2018-06-20 ENCOUNTER — Encounter: Payer: Self-pay | Admitting: Rehabilitation

## 2018-06-20 DIAGNOSIS — C4A9 Merkel cell carcinoma, unspecified: Secondary | ICD-10-CM | POA: Diagnosis not present

## 2018-06-20 DIAGNOSIS — Z483 Aftercare following surgery for neoplasm: Secondary | ICD-10-CM | POA: Diagnosis not present

## 2018-06-20 DIAGNOSIS — I890 Lymphedema, not elsewhere classified: Secondary | ICD-10-CM | POA: Diagnosis not present

## 2018-06-20 NOTE — Patient Instructions (Signed)
Remove bandages if they become uncomfortable.  Keep them on until Friday.

## 2018-06-20 NOTE — Therapy (Signed)
[place], [place], [address] Phone: [phone]   Fax:  [phone]  Physical Therapy Treatment  Patient Details  Name: [name] MRN: [id number] Date of Birth: 02-02-1941 Referring Provider: Orpah Melter, MD   Encounter Date: 06/20/2018  PT End of Session - 06/20/18 2036    Visit Number  2    Number of Visits  24    Date for PT Re-Evaluation  07/13/18    PT Start Time  0932    PT Stop Time  1011    PT Time Calculation (min)  39 min    Activity Tolerance  Patient tolerated treatment well    Behavior During Therapy  [facility] for tasks assessed/performed       Past Medical History:  Diagnosis Date  . Acid reflux   . CAD (coronary artery disease)    PCI to Circ with DES on 04/06/15  . CHF (congestive heart failure) ([place])   . Degenerative disc disease, thoracic   . Hyperlipidemia   . Hyperplastic colon polyp   . LBBB (left bundle branch block)   . 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 Nonischemic cardiomyopathy ([place])    a. s/p STJ CRTD 07/2015  . Obesity   . Ventricular fibrillation ([place])    a. appropriate ICD therapy 02/2017    Past Surgical History:  Procedure Laterality Date  . CARDIAC CATHETERIZATION N/A 04/06/2015   Procedure: Right/Left Heart Cath and Coronary Angiography;  Surgeon: Larey Dresser, MD;  Location: [place] [facility];  Service: Cardiovascular;  Laterality: N/A;  . CARDIAC CATHETERIZATION N/A 04/06/2015   Procedure: Coronary Stent Intervention;  Surgeon: Peter M Martinique, MD;  Location: [place] [facility];  Service: Cardiovascular;  Laterality: N/A;  . CATARACT EXTRACTION W/ INTRAOCULAR LENS  IMPLANT, BILATERAL Bilateral 11/2014  . COLONOSCOPY  2009  . EP IMPLANTABLE DEVICE N/A 08/04/2015   STJ CRTD implanted by Dr Rayann Heman for primary prevention/CHF  . KNEE CARTILAGE SURGERY Left 1960  . SKIN CANCER  EXCISION  05/2005; 07/2005   excision of merkel cell  . TONSILLECTOMY      There were no vitals filed for this visit.  Subjective Assessment - 06/20/18 0930    Subjective  I'm just stiff.  I am going to have to get PT for the hips so I may do it here.  The pump people came to my house and I have one ordered     Pertinent History  Merkel cell tumor removed Lt glute with lymph node invlovement Rt groin and abdomen around 2006.  Pt not remembering if any nodes were positive.  12 weeks of chemotherapy and 5or 6 weeks of radiation , Bil bad hips, pacemaker    Patient Stated Goals  get better garments, treat swelling again    Currently in Pain?  No/denies                       [facility] Adult PT Treatment/Exercise - 06/20/18 0001      Manual Therapy   Manual Therapy  Compression Bandaging;Edema management    Manual therapy comments  discussed getting the pump and incorporating hip treatment into lymphedema therapy    Compression Bandaging  Rt LE: applied lotion, thin stockinette, toe wrap 1-4, artiflex, and then 1-8cm, 3-10cm with 1st 2 in herringbone pattern from foot to knee  PT Long Term Goals - 06/01/18 1452      PT LONG TERM GOAL #1   Title  Pt will reduce Rt LE to the following:  20cm proximal to patella: 63, 10cm from patella: 54, 20cm proximal from lateral malleolus: 39    Baseline  65, 59, 43    Time  6    Period  Weeks    Status  New    [place] Date  07/13/18      PT LONG TERM GOAL #2   Title  Pt will obtain appropriate compression garments for continued containment     Time  6    Period  Weeks    Status  New    [place] Date  07/13/18      PT LONG TERM GOAL #3   Title  Pt will be knowledgeable about self MLD or use of the pump to enhance compression effects     Time  6    Period  Weeks    Status  New    Target Date  07/13/18            Plan - 06/20/18 2036    Clinical Impression Statement  Initiated CDT today with  bandaging to knee for the first day.  He has a flexitouch pump ordered as of yesterday.  He also has an order for PT for his hips that he would like to complete here after he gets his garments ordered.      PT Frequency  3x / week    PT Duration  6 weeks    PT Treatment/Interventions  ADLs/Self Care Home Management;DME Instruction;Therapeutic exercise;Prosthetic Training;Manual lymph drainage;Compression bandaging    PT Next Visit Plan  continue Rt LE CDT, bandage to thigh, MLD, remedial exercises, need to write referral for garments    Recommended Other Services  get measured by [facility] or a special place? Pt would like melissa.  Email sent to her     Consulted and Agree with Plan of Care  Patient;Family member/caregiver       Patient will benefit from skilled therapeutic intervention in order to improve the following deficits and impairments:  Increased edema, Decreased knowledge of use of DME  Visit Diagnosis: Lymphedema, not elsewhere classified  Merkel cell carcinoma [facility])  Aftercare following surgery for neoplasm     Problem List Patient Active Problem List   Diagnosis Date Noted  . Ventricular tachycardia ([place]) 03/17/2017  . Biventricular ICD (implantable cardioverter-defibrillator) in place 12/14/2016  . 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 Elevated MCV 08/10/2015  . Cough 05/29/2015  . Hyperlipidemia 05/29/2015  . OSA (obstructive sleep apnea) 05/27/2015  . CAD (coronary artery disease), native coronary artery 04/22/2015  . Chronic systolic CHF (congestive heart failure) ([place]) 04/06/2015  . Nonischemic cardiomyopathy ([place]) 03/16/2015  . Chronic systolic dysfunction of left ventricle 03/16/2015  . LBBB (left bundle branch block) 03/16/2015  . Morbid obesity ([place]) 03/16/2015    [name], PT 06/20/2018, 8:48 PM  [place], [place], [address] Phone: [phone]   Fax:   [phone]  Name: JABORI HENEGAR MRN: [id number] Date of Birth: [birth year]

## 2018-06-22 ENCOUNTER — Encounter: Payer: Self-pay | Admitting: Rehabilitation

## 2018-06-22 ENCOUNTER — Ambulatory Visit: Payer: Medicare HMO | Admitting: Rehabilitation

## 2018-06-22 DIAGNOSIS — C4A9 Merkel cell carcinoma, unspecified: Secondary | ICD-10-CM | POA: Diagnosis not present

## 2018-06-22 DIAGNOSIS — Z483 Aftercare following surgery for neoplasm: Secondary | ICD-10-CM | POA: Diagnosis not present

## 2018-06-22 DIAGNOSIS — I890 Lymphedema, not elsewhere classified: Secondary | ICD-10-CM | POA: Diagnosis not present

## 2018-06-22 NOTE — Therapy (Signed)
[place], [place], [address] Phone: [phone]   Fax:  [phone]  Physical Therapy Treatment  Patient Details  Name: [name] MRN: [id number] Date of Birth: Nov 20, 1940 Referring Provider: Orpah Melter, MD   Encounter Date: 06/22/2018  PT End of Session - 06/22/18 1109    Visit Number  3    Number of Visits  24    Date for PT Re-Evaluation  07/13/18    PT Start Time  0930    PT Stop Time  1016    PT Time Calculation (min)  46 min    Activity Tolerance  Patient tolerated treatment well    Behavior During Therapy  [facility] for tasks assessed/performed       Past Medical History:  Diagnosis Date  . Acid reflux   . CAD (coronary artery disease)    PCI to Circ with DES on 04/06/15  . CHF (congestive heart failure) ([place])   . Degenerative disc disease, thoracic   . Hyperlipidemia   . Hyperplastic colon polyp   . LBBB (left bundle branch block)   . 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 Nonischemic cardiomyopathy ([place])    a. s/p STJ CRTD 07/2015  . Obesity   . Ventricular fibrillation ([place])    a. appropriate ICD therapy 02/2017    Past Surgical History:  Procedure Laterality Date  . CARDIAC CATHETERIZATION N/A 04/06/2015   Procedure: Right/Left Heart Cath and Coronary Angiography;  Surgeon: Larey Dresser, MD;  Location: [place] [facility];  Service: Cardiovascular;  Laterality: N/A;  . CARDIAC CATHETERIZATION N/A 04/06/2015   Procedure: Coronary Stent Intervention;  Surgeon: Peter M Martinique, MD;  Location: [place] [facility];  Service: Cardiovascular;  Laterality: N/A;  . CATARACT EXTRACTION W/ INTRAOCULAR LENS  IMPLANT, BILATERAL Bilateral 11/2014  . COLONOSCOPY  2009  . EP IMPLANTABLE DEVICE N/A 08/04/2015   STJ CRTD implanted by Dr Rayann Heman for primary prevention/CHF  . KNEE CARTILAGE SURGERY Left 1960  . SKIN CANCER  EXCISION  05/2005; 07/2005   excision of merkel cell  . TONSILLECTOMY      There were no vitals filed for this visit.  Subjective Assessment - 06/22/18 0933    Subjective  Stayed on until last night then I had to take them off to shower.  Arrives with old circular knit compression on just to have something    Patient is accompained by:  Family member    Pertinent History  Merkel cell tumor removed Lt glute with lymph node invlovement Rt groin and abdomen around 2006.  Pt not remembering if any nodes were positive.  12 weeks of chemotherapy and 5or 6 weeks of radiation , Bil bad hips, pacemaker    Patient Stated Goals  get better garments, treat swelling again    Currently in Pain?  No/denies                       [facility] Adult PT Treatment/Exercise - 06/22/18 0001      Manual Therapy   Manual Therapy  Manual Lymphatic Drainage (MLD)    Manual Lymphatic Drainage (MLD)  in supine; deep abdominal and resisted breathing, Rt axillar nodes, Rt inguino-axillilary pathway, then Rt LE from thigh to foot and reversing steps back proximally    Compression Bandaging  Rt LE: applied lotion, thin stockinette, toe wrap 1-4, artiflex, and then 1-8cm, 3-10cm  with 1st 2 in herringbone pattern from foot to knee, then 2 12cm bandages from knee to thigh patient putting compression shorts on over bandages.  Given surgical shoe we had in clinic to wear home                   PT Long Term Goals - 06/22/18 1114      PT LONG TERM GOAL #1   Title  Pt will reduce Rt LE to the following:  20cm proximal to patella: 63, 10cm from patella: 54, 20cm proximal from lateral malleolus: 39    Status  On-going      PT LONG TERM GOAL #2   Title  Pt will obtain appropriate compression garments for continued containment     Status  On-going      PT LONG TERM GOAL #3   Title  Pt will be knowledgeable about self MLD or use of the pump to enhance compression effects     Status  On-going             Plan - 06/22/18 1109    Clinical Impression Statement  pt with no issues wearing foot to knee bandages.  Started MLD today for the Rt LE.  Should be getting the pump tomorrow.  Bandaged from foot to thigh today.      PT Frequency  3x / week    PT Duration  6 weeks    PT Treatment/Interventions  ADLs/Self Care Home Management;DME Instruction;Therapeutic exercise;Prosthetic Training;Manual lymph drainage;Compression bandaging    PT Next Visit Plan  continue Rt LE MLD and bandaging.  Pt is okay to see Melissa need to change appointment time?     Consulted and Agree with Plan of Care  Patient;Family member/caregiver       Patient will benefit from skilled therapeutic intervention in order to improve the following deficits and impairments:  Increased edema, Decreased knowledge of use of DME  Visit Diagnosis: Lymphedema, not elsewhere classified  Merkel cell carcinoma [facility])  Aftercare following surgery for neoplasm     Problem List Patient Active Problem List   Diagnosis Date Noted  . Ventricular tachycardia ([place]) 03/17/2017  . Biventricular ICD (implantable cardioverter-defibrillator) in place 12/14/2016  . 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 Elevated MCV 08/10/2015  . Cough 05/29/2015  . Hyperlipidemia 05/29/2015  . OSA (obstructive sleep apnea) 05/27/2015  . CAD (coronary artery disease), native coronary artery 04/22/2015  . Chronic systolic CHF (congestive heart failure) ([place]) 04/06/2015  . Nonischemic cardiomyopathy ([place]) 03/16/2015  . Chronic systolic dysfunction of left ventricle 03/16/2015  . LBBB (left bundle branch block) 03/16/2015  . Morbid obesity ([place]) 03/16/2015    [name], PT 06/22/2018, [place], [place], [address] Phone: [phone]   Fax:  [phone]  Name: [name] MRN: [id number] Date of Birth: 12/30/1940

## 2018-06-25 ENCOUNTER — Other Ambulatory Visit: Payer: Self-pay | Admitting: Cardiology

## 2018-06-25 DIAGNOSIS — R05 Cough: Secondary | ICD-10-CM

## 2018-06-25 DIAGNOSIS — E785 Hyperlipidemia, unspecified: Secondary | ICD-10-CM

## 2018-06-25 DIAGNOSIS — I428 Other cardiomyopathies: Secondary | ICD-10-CM

## 2018-06-25 DIAGNOSIS — R69 Illness, unspecified: Secondary | ICD-10-CM | POA: Diagnosis not present

## 2018-06-25 DIAGNOSIS — R059 Cough, unspecified: Secondary | ICD-10-CM

## 2018-06-25 DIAGNOSIS — I447 Left bundle-branch block, unspecified: Secondary | ICD-10-CM

## 2018-06-25 DIAGNOSIS — I5022 Chronic systolic (congestive) heart failure: Secondary | ICD-10-CM

## 2018-06-25 DIAGNOSIS — I519 Heart disease, unspecified: Secondary | ICD-10-CM

## 2018-06-25 DIAGNOSIS — I2510 Atherosclerotic heart disease of native coronary artery without angina pectoris: Secondary | ICD-10-CM

## 2018-06-26 ENCOUNTER — Other Ambulatory Visit: Payer: Self-pay | Admitting: Cardiology

## 2018-06-26 DIAGNOSIS — I5022 Chronic systolic (congestive) heart failure: Secondary | ICD-10-CM

## 2018-06-26 DIAGNOSIS — E785 Hyperlipidemia, unspecified: Secondary | ICD-10-CM

## 2018-06-26 DIAGNOSIS — I447 Left bundle-branch block, unspecified: Secondary | ICD-10-CM

## 2018-06-26 DIAGNOSIS — I2510 Atherosclerotic heart disease of native coronary artery without angina pectoris: Secondary | ICD-10-CM

## 2018-06-26 DIAGNOSIS — I519 Heart disease, unspecified: Secondary | ICD-10-CM

## 2018-06-26 DIAGNOSIS — R059 Cough, unspecified: Secondary | ICD-10-CM

## 2018-06-26 DIAGNOSIS — R05 Cough: Secondary | ICD-10-CM

## 2018-06-26 DIAGNOSIS — I428 Other cardiomyopathies: Secondary | ICD-10-CM

## 2018-06-26 MED ORDER — SPIRONOLACTONE 25 MG PO TABS: 12.5000 mg | ORAL_TABLET | Freq: Every day | ORAL | 0 refills | Status: DC

## 2018-06-26 NOTE — Telephone Encounter (Signed)
Pt's medication was sent to pt's pharmacy as requested. Confirmation received.  °

## 2018-06-27 ENCOUNTER — Ambulatory Visit: Payer: Medicare HMO | Attending: Family Medicine | Admitting: Rehabilitation

## 2018-06-27 ENCOUNTER — Encounter: Payer: Self-pay | Admitting: Rehabilitation

## 2018-06-27 DIAGNOSIS — C4A9 Merkel cell carcinoma, unspecified: Secondary | ICD-10-CM | POA: Insufficient documentation

## 2018-06-27 DIAGNOSIS — Z483 Aftercare following surgery for neoplasm: Secondary | ICD-10-CM | POA: Diagnosis not present

## 2018-06-27 DIAGNOSIS — I890 Lymphedema, not elsewhere classified: Secondary | ICD-10-CM | POA: Insufficient documentation

## 2018-06-27 LAB — CUP PACEART REMOTE DEVICE CHECK
Date Time Interrogation Session: 20190904110431
Implantable Lead Implant Date: 20161011
Implantable Lead Implant Date: 20161011
Implantable Lead Location: 753858
Implantable Lead Location: 753860
Implantable Pulse Generator Implant Date: 20161011
MDC IDC LEAD IMPLANT DT: 20161011
MDC IDC LEAD LOCATION: 753860
Pulse Gen Serial Number: 7276664

## 2018-06-27 NOTE — Therapy (Signed)
[place], [place], [address] Phone: [phone]   Fax:  [phone]  Physical Therapy Treatment  Patient Details  Name: [name] MRN: [id number] Date of Birth: 28-Jan-1941 Referring Provider: Orpah Melter, MD   Encounter Date: 06/27/2018  [name] End of Session - 06/27/18 1020    Visit Number  4    Number of Visits  24    Date for [name] Re-Evaluation  07/13/18    [name] Start Time  0931    [name] Stop Time  1019    [name] Time Calculation (min)  48 min    Activity Tolerance  Patient tolerated treatment well    Behavior During Therapy  [facility] for tasks assessed/performed       Past Medical History:  Diagnosis Date  . Acid reflux   . CAD (coronary artery disease)    PCI to Circ with DES on 04/06/15  . CHF (congestive heart failure) ([place])   . Degenerative disc disease, thoracic   . Hyperlipidemia   . Hyperplastic colon polyp   . LBBB (left bundle branch block)   . 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 Nonischemic cardiomyopathy ([place])    a. s/p STJ CRTD 07/2015  . Obesity   . Ventricular fibrillation ([place])    a. appropriate ICD therapy 02/2017    Past Surgical History:  Procedure Laterality Date  . CARDIAC CATHETERIZATION N/A 04/06/2015   Procedure: Right/Left Heart Cath and Coronary Angiography;  Surgeon: Larey Dresser, MD;  Location: [place] [facility];  Service: Cardiovascular;  Laterality: N/A;  . CARDIAC CATHETERIZATION N/A 04/06/2015   Procedure: Coronary Stent Intervention;  Surgeon: Peter M Martinique, MD;  Location: [place] [facility];  Service: Cardiovascular;  Laterality: N/A;  . CATARACT EXTRACTION W/ INTRAOCULAR LENS  IMPLANT, BILATERAL Bilateral 11/2014  . COLONOSCOPY  2009  . EP IMPLANTABLE DEVICE N/A 08/04/2015   STJ CRTD implanted by Dr Rayann Heman for primary prevention/CHF  . KNEE CARTILAGE SURGERY Left 1960  . SKIN CANCER  EXCISION  05/2005; 07/2005   excision of merkel cell  . TONSILLECTOMY      There were no vitals filed for this visit.  Subjective Assessment - 06/27/18 0939    Subjective  The garments stayed on x 2 days.  Arrives today without anything on.  Okay with getting measured on Monday    Pertinent History  Merkel cell tumor removed Lt glute with lymph node invlovement Rt groin and abdomen around 2006.  [name] not remembering if any nodes were positive.  12 weeks of chemotherapy and 5or 6 weeks of radiation , Bil bad hips, pacemaker    Patient Stated Goals  get better garments, treat swelling again    Currently in Pain?  No/denies                       [facility] Adult [name] Treatment/Exercise - 06/27/18 0001      Manual Therapy   Manual Lymphatic Drainage (MLD)  in supine; deep abdominal and resisted breathing, Rt axillar nodes, Rt inguino-axillilary pathway, then Rt LE from thigh to foot and reversing steps back proximally    Compression Bandaging  Rt LE: applied lotion, thin stockinette, toe wrap 1-4, artiflex, and then 1-8cm, 3-10cm with 1st 2 in herringbone pattern from foot to knee, then 2 12cm bandages from knee to thigh patient putting compression shorts on over bandages.  [name] Long Term Goals - 06/22/18 1114      [name] LONG TERM GOAL #1   Title  [name] will reduce Rt LE to the following:  20cm proximal to patella: 63, 10cm from patella: 54, 20cm proximal from lateral malleolus: 39    Status  On-going      [name] LONG TERM GOAL #2   Title  [name] will obtain appropriate compression garments for continued containment     Status  On-going      [name] LONG TERM GOAL #3   Title  [name] will be knowledgeable about self MLD or use of the pump to enhance compression effects     Status  On-going            Plan - 06/27/18 1020    Clinical Impression Statement  [name] continues with tolerate all well.  Will be measured on monday for garments and has the pump.  [name] reports [name] can  feel space between his toes now.      [name] Frequency  3x / week    [name] Duration  6 weeks    [name] Treatment/Interventions  ADLs/Self Care Home Management;DME Instruction;Therapeutic exercise;Prosthetic Training;Manual lymph drainage;Compression bandaging    [name] Next Visit Plan  continue Rt LE MLD and bandaging.         Patient will benefit from skilled therapeutic intervention in order to improve the following deficits and impairments:  Increased edema, Decreased knowledge of use of DME  Visit Diagnosis: Lymphedema, not elsewhere classified  Merkel cell carcinoma [facility])  Aftercare following surgery for neoplasm     Problem List Patient Active Problem List   Diagnosis Date Noted  . Ventricular tachycardia ([place]) 03/17/2017  . Biventricular ICD (implantable cardioverter-defibrillator) in place 12/14/2016  . 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 Elevated MCV 08/10/2015  . Cough 05/29/2015  . Hyperlipidemia 05/29/2015  . OSA (obstructive sleep apnea) 05/27/2015  . CAD (coronary artery disease), native coronary artery 04/22/2015  . Chronic systolic CHF (congestive heart failure) ([place]) 04/06/2015  . Nonischemic cardiomyopathy ([place]) 03/16/2015  . Chronic systolic dysfunction of left ventricle 03/16/2015  . LBBB (left bundle branch block) 03/16/2015  . Morbid obesity ([place]) 03/16/2015    [name], [name] 06/27/2018, 10:21 AM  [place], [place], [address] Phone: [phone]   Fax:  [phone]  Name: [name] MRN: [id number] Date of Birth: 1940/10/26

## 2018-06-29 ENCOUNTER — Ambulatory Visit: Payer: Medicare HMO | Admitting: Rehabilitation

## 2018-06-29 ENCOUNTER — Encounter: Payer: Self-pay | Admitting: Rehabilitation

## 2018-06-29 DIAGNOSIS — C4A9 Merkel cell carcinoma, unspecified: Secondary | ICD-10-CM

## 2018-06-29 DIAGNOSIS — I890 Lymphedema, not elsewhere classified: Secondary | ICD-10-CM

## 2018-06-29 DIAGNOSIS — Z483 Aftercare following surgery for neoplasm: Secondary | ICD-10-CM

## 2018-06-29 NOTE — Therapy (Signed)
[place], [place], [address] Phone: [phone]   Fax:  [phone]  Physical Therapy Treatment  Patient Details  Name: [name] MRN: [id number] Date of Birth: 10/06/1941 Referring Provider: Orpah Melter, MD   Encounter Date: 06/29/2018  PT End of Session - 06/29/18 1026    Visit Number  5    Number of Visits  24    Date for PT Re-Evaluation  07/13/18    PT Start Time  0932    PT Stop Time  1025    PT Time Calculation (min)  53 min    Activity Tolerance  Patient tolerated treatment well    Behavior During Therapy  [facility] for tasks assessed/performed       Past Medical History:  Diagnosis Date  . Acid reflux   . CAD (coronary artery disease)    PCI to Circ with DES on 04/06/15  . CHF (congestive heart failure) ([place])   . Degenerative disc disease, thoracic   . Hyperlipidemia   . Hyperplastic colon polyp   . LBBB (left bundle branch block)   . 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 Nonischemic cardiomyopathy ([place])    a. s/p STJ CRTD 07/2015  . Obesity   . Ventricular fibrillation ([place])    a. appropriate ICD therapy 02/2017    Past Surgical History:  Procedure Laterality Date  . CARDIAC CATHETERIZATION N/A 04/06/2015   Procedure: Right/Left Heart Cath and Coronary Angiography;  Surgeon: Larey Dresser, MD;  Location: [place] [facility];  Service: Cardiovascular;  Laterality: N/A;  . CARDIAC CATHETERIZATION N/A 04/06/2015   Procedure: Coronary Stent Intervention;  Surgeon: Peter M Martinique, MD;  Location: [place] [facility];  Service: Cardiovascular;  Laterality: N/A;  . CATARACT EXTRACTION W/ INTRAOCULAR LENS  IMPLANT, BILATERAL Bilateral 11/2014  . COLONOSCOPY  2009  . EP IMPLANTABLE DEVICE N/A 08/04/2015   STJ CRTD implanted by Dr Rayann Heman for primary prevention/CHF  . KNEE CARTILAGE SURGERY Left 1960  . SKIN CANCER  EXCISION  05/2005; 07/2005   excision of merkel cell  . TONSILLECTOMY      There were no vitals filed for this visit.  Subjective Assessment - 06/29/18 0929    Subjective  Pump lady coming Sunday.  Doing well.      Pertinent History  Merkel cell tumor removed Lt glute with lymph node invlovement Rt groin and abdomen around 2006.  Pt not remembering if any nodes were positive.  12 weeks of chemotherapy and 5or 6 weeks of radiation , Bil bad hips, pacemaker    Patient Stated Goals  get better garments, treat swelling again    Currently in Pain?  No/denies            LYMPHEDEMA/ONCOLOGY QUESTIONNAIRE - 06/29/18 0936      Right Lower Extremity Lymphedema   20 cm Proximal to Suprapatella  64.6 cm    10 cm Proximal to Suprapatella  55.5 cm    At Midpatella/Popliteal Crease  46.5 cm    30 cm Proximal to Floor at Lateral Plantar Foot  44.8 cm    20 cm Proximal to Floor at Lateral Plantar Foot  44.5 1    10  cm Proximal to Floor at Lateral Malleoli  33.8 cm    Circumference of ankle/heel  27 cm.    5 cm Proximal to 1st MTP Joint  25 cm    Across  MTP Joint  25.5 cm    Around Proximal Great Toe  9.5 cm                [facility] Adult PT Treatment/Exercise - 06/29/18 0001      Manual Therapy   Manual Lymphatic Drainage (MLD)  in supine; deep abdominal and resisted breathing, Rt axillar nodes, Rt inguino-axillilary pathway, then Rt LE from thigh to foot and reversing steps back proximally    Compression Bandaging  Rt LE: applied lotion, thin stockinette, toe wrap 1-4, artiflex, and then 1-8cm, 3-10cm with 1st 2 in herringbone pattern from foot to knee, then 2 12cm bandages from knee to thigh patient putting compression shorts on over bandages.                   PT Long Term Goals - 06/29/18 1028      PT LONG TERM GOAL #1   Title  Pt will reduce Rt LE to the following:  20cm proximal to patella: 63, 10cm from patella: 54, 20cm proximal from lateral malleolus: 39     Status  On-going      PT LONG TERM GOAL #2   Title  Pt will obtain appropriate compression garments for continued containment     Status  On-going      PT LONG TERM GOAL #3   Title  Pt will be knowledgeable about self MLD or use of the pump to enhance compression effects     Status  Partially Met            Plan - 06/29/18 1027    Clinical Impression Statement  Pt continues to tolerate CDT well.  He should get the pump on Sunday and will be measured for garments on Monday with Melissa.  Unsure about nighttime garment.  measurements were the same in some places and decreased by up 1-3 cm in other places today.      PT Frequency  3x / week    PT Duration  6 weeks    PT Treatment/Interventions  ADLs/Self Care Home Management;DME Instruction;Therapeutic exercise;Prosthetic Training;Manual lymph drainage;Compression bandaging    PT Next Visit Plan  continue Rt LE MLD and bandaging.      Consulted and Agree with Plan of Care  Patient;Family member/caregiver       Patient will benefit from skilled therapeutic intervention in order to improve the following deficits and impairments:  Increased edema, Decreased knowledge of use of DME  Visit Diagnosis: Lymphedema, not elsewhere classified  Merkel cell carcinoma [facility])  Aftercare following surgery for neoplasm     Problem List Patient Active Problem List   Diagnosis Date Noted  . Ventricular tachycardia ([place]) 03/17/2017  . Biventricular ICD (implantable cardioverter-defibrillator) in place 12/14/2016  . 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 Elevated MCV 08/10/2015  . Cough 05/29/2015  . Hyperlipidemia 05/29/2015  . OSA (obstructive sleep apnea) 05/27/2015  . CAD (coronary artery disease), native coronary artery 04/22/2015  . Chronic systolic CHF (congestive heart failure) ([place]) 04/06/2015  . Nonischemic cardiomyopathy ([place]) 03/16/2015  . Chronic systolic dysfunction of left ventricle 03/16/2015  . LBBB (left bundle branch  block) 03/16/2015  . Morbid obesity ([place]) 03/16/2015    [name], PT 06/29/2018, 10:30 AM  [place], [place], [address] Phone: [phone]   Fax:  [phone]  Name: UMBERTO PAVEK MRN: [id number] Date of Birth: 09-14-1941

## 2018-07-02 ENCOUNTER — Ambulatory Visit: Payer: Medicare HMO | Admitting: Rehabilitation

## 2018-07-02 ENCOUNTER — Encounter: Payer: Self-pay | Admitting: Rehabilitation

## 2018-07-02 DIAGNOSIS — Z483 Aftercare following surgery for neoplasm: Secondary | ICD-10-CM | POA: Diagnosis not present

## 2018-07-02 DIAGNOSIS — I890 Lymphedema, not elsewhere classified: Secondary | ICD-10-CM | POA: Diagnosis not present

## 2018-07-02 DIAGNOSIS — C4A9 Merkel cell carcinoma, unspecified: Secondary | ICD-10-CM

## 2018-07-02 NOTE — Therapy (Signed)
[place], [place], [address] Phone: [phone]   Fax:  [phone]  Physical Therapy Treatment  Patient Details  Name: [name] MRN: [id number] Date of Birth: 1941/04/08 Referring Provider: Orpah Melter, MD   Encounter Date: 07/02/2018  PT End of Session - 07/02/18 1011    Visit Number  6    Number of Visits  24    Date for PT Re-Evaluation  07/13/18    PT Start Time  0945    PT Stop Time  1000    PT Time Calculation (min)  15 min    Activity Tolerance  Patient tolerated treatment well    Behavior During Therapy  [facility] for tasks assessed/performed       Past Medical History:  Diagnosis Date  . Acid reflux   . CAD (coronary artery disease)    PCI to Circ with DES on 04/06/15  . CHF (congestive heart failure) ([place])   . Degenerative disc disease, thoracic   . Hyperlipidemia   . Hyperplastic colon polyp   . LBBB (left bundle branch block)   . 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 Nonischemic cardiomyopathy ([place])    a. s/p STJ CRTD 07/2015  . Obesity   . Ventricular fibrillation ([place])    a. appropriate ICD therapy 02/2017    Past Surgical History:  Procedure Laterality Date  . CARDIAC CATHETERIZATION N/A 04/06/2015   Procedure: Right/Left Heart Cath and Coronary Angiography;  Surgeon: Larey Dresser, MD;  Location: [place] [facility];  Service: Cardiovascular;  Laterality: N/A;  . CARDIAC CATHETERIZATION N/A 04/06/2015   Procedure: Coronary Stent Intervention;  Surgeon: Peter M Martinique, MD;  Location: [place] [facility];  Service: Cardiovascular;  Laterality: N/A;  . CATARACT EXTRACTION W/ INTRAOCULAR LENS  IMPLANT, BILATERAL Bilateral 11/2014  . COLONOSCOPY  2009  . EP IMPLANTABLE DEVICE N/A 08/04/2015   STJ CRTD implanted by Dr Rayann Heman for primary prevention/CHF  . KNEE CARTILAGE SURGERY Left 1960  . SKIN CANCER  EXCISION  05/2005; 07/2005   excision of merkel cell  . TONSILLECTOMY      There were no vitals filed for this visit.  Subjective Assessment - 07/02/18 0932    Subjective  Tried the pump for about an hour and I think its great     Pertinent History  Merkel cell tumor removed Lt glute with lymph node invlovement Rt groin and abdomen around 2006.  Pt not remembering if any nodes were positive.  12 weeks of chemotherapy and 5or 6 weeks of radiation , Bil bad hips, pacemaker    Currently in Pain?  No/denies                        [facility] Adult PT Treatment/Exercise - 07/02/18 0001      Manual Therapy   Manual Lymphatic Drainage (MLD)  in supine; deep abdominal and resisted breathing, Rt axillar nodes, Rt inguino-axillilary pathway, then Rt LE from thigh to foot and reversing steps back proximally    Compression Bandaging  Melissa here to measure patient for garments so did not wrap today.  Pt will go home and do the pump and then wear his current garment                   PT Long Term Goals - 06/29/18 1028      PT LONG TERM  GOAL #1   Title  Pt will reduce Rt LE to the following:  20cm proximal to patella: 63, 10cm from patella: 54, 20cm proximal from lateral malleolus: 39    Status  On-going      PT LONG TERM GOAL #2   Title  Pt will obtain appropriate compression garments for continued containment     Status  On-going      PT LONG TERM GOAL #3   Title  Pt will be knowledgeable about self MLD or use of the pump to enhance compression effects     Status  Partially Met            Plan - 07/02/18 1012    Clinical Impression Statement  Pt having success with pump and has been voiding much more and feels like the abdomen is not as swollen      PT Frequency  3x / week    PT Duration  6 weeks    PT Treatment/Interventions  ADLs/Self Care Home Management;DME Instruction;Therapeutic exercise;Prosthetic Training;Manual lymph drainage;Compression bandaging    PT  Next Visit Plan  continue Rt LE MLD and bandaging.      Consulted and Agree with Plan of Care  Patient;Family member/caregiver       Patient will benefit from skilled therapeutic intervention in order to improve the following deficits and impairments:  Increased edema, Decreased knowledge of use of DME  Visit Diagnosis: Lymphedema, not elsewhere classified  Merkel cell carcinoma [facility])  Aftercare following surgery for neoplasm     Problem List Patient Active Problem List   Diagnosis Date Noted  . Ventricular tachycardia ([place]) 03/17/2017  . Biventricular ICD (implantable cardioverter-defibrillator) in place 12/14/2016  . 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 Elevated MCV 08/10/2015  . Cough 05/29/2015  . Hyperlipidemia 05/29/2015  . OSA (obstructive sleep apnea) 05/27/2015  . CAD (coronary artery disease), native coronary artery 04/22/2015  . Chronic systolic CHF (congestive heart failure) ([place]) 04/06/2015  . Nonischemic cardiomyopathy ([place]) 03/16/2015  . Chronic systolic dysfunction of left ventricle 03/16/2015  . LBBB (left bundle branch block) 03/16/2015  . Morbid obesity ([place]) 03/16/2015    [name], PT 07/02/2018, 10:14 AM  [place], [place], [address] Phone: [phone]   Fax:  [phone]  Name: LAQUAN BEIER MRN: [id number] Date of Birth: [birth year]

## 2018-07-04 ENCOUNTER — Ambulatory Visit: Payer: Medicare HMO | Admitting: Rehabilitation

## 2018-07-04 DIAGNOSIS — C4A9 Merkel cell carcinoma, unspecified: Secondary | ICD-10-CM

## 2018-07-04 DIAGNOSIS — I890 Lymphedema, not elsewhere classified: Secondary | ICD-10-CM | POA: Diagnosis not present

## 2018-07-04 DIAGNOSIS — Z483 Aftercare following surgery for neoplasm: Secondary | ICD-10-CM | POA: Diagnosis not present

## 2018-07-04 NOTE — Therapy (Signed)
[place], [place], [address] Phone: [phone]   Fax:  [phone]  Physical Therapy Treatment  Patient Details  Name: [name] MRN: [id number] Date of Birth: 09/28/1941 Referring Provider: Orpah Melter, MD   Encounter Date: 07/04/2018  PT End of Session - 07/04/18 1035    Visit Number  7    Number of Visits  24    Date for PT Re-Evaluation  07/13/18    PT Start Time  0935    PT Stop Time  1015    PT Time Calculation (min)  40 min    Activity Tolerance  Patient tolerated treatment well    Behavior During Therapy  [facility] for tasks assessed/performed       Past Medical History:  Diagnosis Date  . Acid reflux   . CAD (coronary artery disease)    PCI to Circ with DES on 04/06/15  . CHF (congestive heart failure) ([place])   . Degenerative disc disease, thoracic   . Hyperlipidemia   . Hyperplastic colon polyp   . LBBB (left bundle branch block)   . 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 Nonischemic cardiomyopathy ([place])    a. s/p STJ CRTD 07/2015  . Obesity   . Ventricular fibrillation ([place])    a. appropriate ICD therapy 02/2017    Past Surgical History:  Procedure Laterality Date  . CARDIAC CATHETERIZATION N/A 04/06/2015   Procedure: Right/Left Heart Cath and Coronary Angiography;  Surgeon: Larey Dresser, MD;  Location: [place] [facility];  Service: Cardiovascular;  Laterality: N/A;  . CARDIAC CATHETERIZATION N/A 04/06/2015   Procedure: Coronary Stent Intervention;  Surgeon: Peter M Martinique, MD;  Location: [place] [facility];  Service: Cardiovascular;  Laterality: N/A;  . CATARACT EXTRACTION W/ INTRAOCULAR LENS  IMPLANT, BILATERAL Bilateral 11/2014  . COLONOSCOPY  2009  . EP IMPLANTABLE DEVICE N/A 08/04/2015   STJ CRTD implanted by Dr Rayann Heman for primary prevention/CHF  . KNEE CARTILAGE SURGERY Left 1960  . SKIN CANCER  EXCISION  05/2005; 07/2005   excision of merkel cell  . TONSILLECTOMY      There were no vitals filed for this visit.  Subjective Assessment - 07/04/18 1019    Subjective  Still doing well    Pertinent History  Merkel cell tumor removed Lt glute with lymph node invlovement Rt groin and abdomen around 2006.  Pt not remembering if any nodes were positive.  12 weeks of chemotherapy and 5or 6 weeks of radiation , Bil bad hips, pacemaker    Currently in Pain?  No/denies                       [facility] Adult PT Treatment/Exercise - 07/04/18 0001      Manual Therapy   Manual Lymphatic Drainage (MLD)  in supine; deep abdominal and resisted breathing, Rt axillar nodes, Rt inguino-axillilary pathway, then Rt LE from thigh to foot and reversing steps back proximally    Compression Bandaging  Rt LE lotion, thin stockinette, artiflex from toes to hip, toe wraps 1-4, 1-6cm foot wrap, 2 8cm to the lower leg in herringbone pattern, 2-12cm from knee to thigh and 1 12cm from ankle to thigh                  PT Long Term Goals - 06/29/18 1028      PT LONG TERM GOAL #  1   Title  Pt will reduce Rt LE to the following:  20cm proximal to patella: 63, 10cm from patella: 54, 20cm proximal from lateral malleolus: 39    Status  On-going      PT LONG TERM GOAL #2   Title  Pt will obtain appropriate compression garments for continued containment     Status  On-going      PT LONG TERM GOAL #3   Title  Pt will be knowledgeable about self MLD or use of the pump to enhance compression effects     Status  Partially Met            Plan - 07/04/18 1036    Clinical Impression Statement  Continues to tolerate CDT well.         Patient will benefit from skilled therapeutic intervention in order to improve the following deficits and impairments:  Increased edema, Decreased knowledge of use of DME  Visit Diagnosis: Lymphedema, not elsewhere classified  Merkel cell carcinoma  [facility])  Aftercare following surgery for neoplasm     Problem List Patient Active Problem List   Diagnosis Date Noted  . Ventricular tachycardia ([place]) 03/17/2017  . Biventricular ICD (implantable cardioverter-defibrillator) in place 12/14/2016  . 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 Elevated MCV 08/10/2015  . Cough 05/29/2015  . Hyperlipidemia 05/29/2015  . OSA (obstructive sleep apnea) 05/27/2015  . CAD (coronary artery disease), native coronary artery 04/22/2015  . Chronic systolic CHF (congestive heart failure) ([place]) 04/06/2015  . Nonischemic cardiomyopathy ([place]) 03/16/2015  . Chronic systolic dysfunction of left ventricle 03/16/2015  . LBBB (left bundle branch block) 03/16/2015  . Morbid obesity ([place]) 03/16/2015    [name], PT 07/04/2018, 10:59 AM  [place], [place], [address] Phone: [phone]   Fax:  [phone]  Name: [name] MRN: [id number] Date of Birth: 1941/09/16

## 2018-07-06 ENCOUNTER — Encounter: Payer: Self-pay | Admitting: Rehabilitation

## 2018-07-06 ENCOUNTER — Ambulatory Visit: Payer: Medicare HMO | Admitting: Rehabilitation

## 2018-07-06 DIAGNOSIS — I890 Lymphedema, not elsewhere classified: Secondary | ICD-10-CM | POA: Diagnosis not present

## 2018-07-06 DIAGNOSIS — C4A9 Merkel cell carcinoma, unspecified: Secondary | ICD-10-CM | POA: Diagnosis not present

## 2018-07-06 DIAGNOSIS — Z483 Aftercare following surgery for neoplasm: Secondary | ICD-10-CM | POA: Diagnosis not present

## 2018-07-06 NOTE — Therapy (Signed)
[place], [place], [address] Phone: [phone]   Fax:  [phone]  Physical Therapy Treatment  Patient Details  Name: [name] MRN: [id number] Date of Birth: 1941/04/07 Referring Provider: Orpah Melter, MD   Encounter Date: 07/06/2018  PT End of Session - 07/06/18 1021    Visit Number  8    Number of Visits  24    Date for PT Re-Evaluation  07/13/18    PT Start Time  0935    PT Stop Time  1020    PT Time Calculation (min)  45 min    Activity Tolerance  Patient tolerated treatment well    Behavior During Therapy  [facility] for tasks assessed/performed       Past Medical History:  Diagnosis Date  . Acid reflux   . CAD (coronary artery disease)    PCI to Circ with DES on 04/06/15  . CHF (congestive heart failure) ([place])   . Degenerative disc disease, thoracic   . Hyperlipidemia   . Hyperplastic colon polyp   . LBBB (left bundle branch block)   . 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 Nonischemic cardiomyopathy ([place])    a. s/p STJ CRTD 07/2015  . Obesity   . Ventricular fibrillation ([place])    a. appropriate ICD therapy 02/2017    Past Surgical History:  Procedure Laterality Date  . CARDIAC CATHETERIZATION N/A 04/06/2015   Procedure: Right/Left Heart Cath and Coronary Angiography;  Surgeon: Larey Dresser, MD;  Location: [place] [facility];  Service: Cardiovascular;  Laterality: N/A;  . CARDIAC CATHETERIZATION N/A 04/06/2015   Procedure: Coronary Stent Intervention;  Surgeon: Peter M Martinique, MD;  Location: [place] [facility];  Service: Cardiovascular;  Laterality: N/A;  . CATARACT EXTRACTION W/ INTRAOCULAR LENS  IMPLANT, BILATERAL Bilateral 11/2014  . COLONOSCOPY  2009  . EP IMPLANTABLE DEVICE N/A 08/04/2015   STJ CRTD implanted by Dr Rayann Heman for primary prevention/CHF  . KNEE CARTILAGE SURGERY Left 1960  . SKIN CANCER  EXCISION  05/2005; 07/2005   excision of merkel cell  . TONSILLECTOMY      There were no vitals filed for this visit.  Subjective Assessment - 07/06/18 0934    Subjective  Still doing well    Pertinent History  Merkel cell tumor removed Lt glute with lymph node invlovement Rt groin and abdomen around 2006.  Pt not remembering if any nodes were positive.  12 weeks of chemotherapy and 5or 6 weeks of radiation , Bil bad hips, pacemaker    Patient Stated Goals  get better garments, treat swelling again    Currently in Pain?  No/denies                       [facility] Adult PT Treatment/Exercise - 07/06/18 0001      Manual Therapy   Manual Lymphatic Drainage (MLD)  in supine; deep abdominal and resisted breathing, Rt axillar nodes, Rt inguino-axillilary pathway, then Rt LE from thigh to foot and reversing steps back proximally    Compression Bandaging  Rt LE lotion, thin stockinette, artiflex from toes to hip, toe wraps 1-4, 1-6cm foot wrap, 2 8cm to the lower leg in herringbone pattern, 2-12cm from knee to thigh and 1 12cm from ankle to thigh                  PT Long Term  Goals - 07/06/18 1022      PT LONG TERM GOAL #1   Title  Pt will reduce Rt LE to the following:  20cm proximal to patella: 63, 10cm from patella: 54, 20cm proximal from lateral malleolus: 39    Status  On-going      PT LONG TERM GOAL #2   Title  Pt will obtain appropriate compression garments for continued containment     Status  Partially Met      PT LONG TERM GOAL #3   Title  Pt will be knowledgeable about self MLD or use of the pump to enhance compression effects     Status  Achieved            Plan - 07/06/18 1021    Clinical Impression Statement  [name] continues to tolerate CDT well.  The toe part was a bit tight last time so he loosened it at home.      PT Frequency  3x / week    PT Duration  6 weeks    PT Treatment/Interventions  ADLs/Self Care Home Management;DME  Instruction;Therapeutic exercise;Prosthetic Training;Manual lymph drainage;Compression bandaging    PT Next Visit Plan  continue Rt LE MLD and bandaging.         Patient will benefit from skilled therapeutic intervention in order to improve the following deficits and impairments:  Increased edema, Decreased knowledge of use of DME  Visit Diagnosis: Lymphedema, not elsewhere classified  Merkel cell carcinoma [facility])  Aftercare following surgery for neoplasm     Problem List Patient Active Problem List   Diagnosis Date Noted  . Ventricular tachycardia ([place]) 03/17/2017  . Biventricular ICD (implantable cardioverter-defibrillator) in place 12/14/2016  . 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 Elevated MCV 08/10/2015  . Cough 05/29/2015  . Hyperlipidemia 05/29/2015  . OSA (obstructive sleep apnea) 05/27/2015  . CAD (coronary artery disease), native coronary artery 04/22/2015  . Chronic systolic CHF (congestive heart failure) ([place]) 04/06/2015  . Nonischemic cardiomyopathy ([place]) 03/16/2015  . Chronic systolic dysfunction of left ventricle 03/16/2015  . LBBB (left bundle branch block) 03/16/2015  . Morbid obesity ([place]) 03/16/2015    [name], PT 07/06/2018, 10:22 AM  [place], [place], [address] Phone: [phone]   Fax:  [phone]  Name: [name] MRN: [id number] Date of Birth: 1941-01-12

## 2018-07-09 ENCOUNTER — Encounter: Payer: Self-pay | Admitting: Rehabilitation

## 2018-07-09 ENCOUNTER — Ambulatory Visit: Payer: Medicare HMO | Admitting: Rehabilitation

## 2018-07-09 DIAGNOSIS — I890 Lymphedema, not elsewhere classified: Secondary | ICD-10-CM

## 2018-07-09 DIAGNOSIS — C4A9 Merkel cell carcinoma, unspecified: Secondary | ICD-10-CM

## 2018-07-09 DIAGNOSIS — Z483 Aftercare following surgery for neoplasm: Secondary | ICD-10-CM | POA: Diagnosis not present

## 2018-07-09 NOTE — Therapy (Signed)
[place], [place], [address] Phone: [phone]   Fax:  [phone]  Physical Therapy Treatment  Patient Details  Name: [name] MRN: [id number] Date of Birth: 10-Nov-1940 Referring Provider: Orpah Melter, MD   Encounter Date: 07/09/2018  PT End of Session - 07/09/18 1050    Visit Number  9    Number of Visits  24    Date for PT Re-Evaluation  07/13/18    PT Start Time  0934    PT Stop Time  1015    PT Time Calculation (min)  41 min    Activity Tolerance  Patient tolerated treatment well    Behavior During Therapy  [facility] for tasks assessed/performed       Past Medical History:  Diagnosis Date  . Acid reflux   . CAD (coronary artery disease)    PCI to Circ with DES on 04/06/15  . CHF (congestive heart failure) ([place])   . Degenerative disc disease, thoracic   . Hyperlipidemia   . Hyperplastic colon polyp   . LBBB (left bundle branch block)   . 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 Nonischemic cardiomyopathy ([place])    a. s/p STJ CRTD 07/2015  . Obesity   . Ventricular fibrillation ([place])    a. appropriate ICD therapy 02/2017    Past Surgical History:  Procedure Laterality Date  . CARDIAC CATHETERIZATION N/A 04/06/2015   Procedure: Right/Left Heart Cath and Coronary Angiography;  Surgeon: Larey Dresser, MD;  Location: [place] [facility];  Service: Cardiovascular;  Laterality: N/A;  . CARDIAC CATHETERIZATION N/A 04/06/2015   Procedure: Coronary Stent Intervention;  Surgeon: Peter M Martinique, MD;  Location: [place] [facility];  Service: Cardiovascular;  Laterality: N/A;  . CATARACT EXTRACTION W/ INTRAOCULAR LENS  IMPLANT, BILATERAL Bilateral 11/2014  . COLONOSCOPY  2009  . EP IMPLANTABLE DEVICE N/A 08/04/2015   STJ CRTD implanted by Dr Rayann Heman for primary prevention/CHF  . KNEE CARTILAGE SURGERY Left 1960  . SKIN CANCER  EXCISION  05/2005; 07/2005   excision of merkel cell  . TONSILLECTOMY      There were no vitals filed for this visit.  Subjective Assessment - 07/09/18 0931    Subjective  Maybe having some issues with the machine.  Will call the pump lady     Pertinent History  Merkel cell tumor removed Lt glute with lymph node invlovement Rt groin and abdomen around 2006.  Pt not remembering if any nodes were positive.  12 weeks of chemotherapy and 5or 6 weeks of radiation , Bil bad hips, pacemaker    Patient Stated Goals  get better garments, treat swelling again    Currently in Pain?  No/denies            LYMPHEDEMA/ONCOLOGY QUESTIONNAIRE - 07/09/18 0935      Right Lower Extremity Lymphedema   20 cm Proximal to Suprapatella  62.5 cm    10 cm Proximal to Suprapatella  55 cm    At Midpatella/Popliteal Crease  46.5 cm    30 cm Proximal to Floor at Lateral Plantar Foot  44.5 cm    20 cm Proximal to Floor at Lateral Plantar Foot  44.5 1    10  cm Proximal to Floor at Lateral Malleoli  33 cm    5 cm Proximal to 1st MTP Joint  25 cm    Across MTP Joint  25.3  cm    Around Proximal Great Toe  9.3 cm                [facility] Adult PT Treatment/Exercise - 07/09/18 0001      Manual Therapy   Manual Lymphatic Drainage (MLD)  in supine; deep abdominal and resisted breathing, Rt axillar nodes, Rt inguino-axillilary pathway, then Rt LE from thigh to foot and reversing steps back proximally    Compression Bandaging  Rt LE lotion, thin stockinette, artiflex from toes to hip, toe wraps 1-4, 1-6cm foot wrap, 2 8cm to the lower leg in herringbone pattern, 2-12cm from knee to thigh and 1 12cm from ankle to thigh                  PT Long Term Goals - 07/09/18 1116      PT LONG TERM GOAL #1   Title  Pt will reduce Rt LE to the following:  20cm proximal to patella: 63, 10cm from patella: 54, 20cm proximal from lateral malleolus: 39    Status  On-going      PT LONG TERM GOAL #2   Title  Pt  will obtain appropriate compression garments for continued containment     Status  On-going      PT LONG TERM GOAL #3   Title  Pt will be knowledgeable about self MLD or use of the pump to enhance compression effects     Status  Achieved            Plan - 07/09/18 1111    Clinical Impression Statement  Dquan's measurements are sustaining the lower leg but the thigh is now down from 0.5-2cm which had not changed yet.  Pt is just awating garments and then we will start working on the hip pain    PT Frequency  3x / week    PT Duration  6 weeks    PT Treatment/Interventions  ADLs/Self Care Home Management;DME Instruction;Therapeutic exercise;Prosthetic Training;Manual lymph drainage;Compression bandaging    PT Next Visit Plan  continue Rt LE MLD and bandaging.      Consulted and Agree with Plan of Care  Patient       Patient will benefit from skilled therapeutic intervention in order to improve the following deficits and impairments:  Increased edema, Decreased knowledge of use of DME  Visit Diagnosis: Lymphedema, not elsewhere classified  Merkel cell carcinoma [facility])  Aftercare following surgery for neoplasm     Problem List Patient Active Problem List   Diagnosis Date Noted  . Ventricular tachycardia ([place]) 03/17/2017  . Biventricular ICD (implantable cardioverter-defibrillator) in place 12/14/2016  . 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 Elevated MCV 08/10/2015  . Cough 05/29/2015  . Hyperlipidemia 05/29/2015  . OSA (obstructive sleep apnea) 05/27/2015  . CAD (coronary artery disease), native coronary artery 04/22/2015  . Chronic systolic CHF (congestive heart failure) ([place]) 04/06/2015  . Nonischemic cardiomyopathy ([place]) 03/16/2015  . Chronic systolic dysfunction of left ventricle 03/16/2015  . LBBB (left bundle branch block) 03/16/2015  . Morbid obesity ([place]) 03/16/2015    [name], PT 07/09/2018, 11:17 AM  [place], [place], [address] Phone: [phone]   Fax:  [phone]  Name: KEYSHON STEIN MRN: [id number] Date of Birth: Aug 01, 1941

## 2018-07-11 ENCOUNTER — Ambulatory Visit: Payer: Medicare HMO

## 2018-07-11 DIAGNOSIS — C4A9 Merkel cell carcinoma, unspecified: Secondary | ICD-10-CM

## 2018-07-11 DIAGNOSIS — Z483 Aftercare following surgery for neoplasm: Secondary | ICD-10-CM | POA: Diagnosis not present

## 2018-07-11 DIAGNOSIS — I890 Lymphedema, not elsewhere classified: Secondary | ICD-10-CM | POA: Diagnosis not present

## 2018-07-11 NOTE — Therapy (Addendum)
[place], [place], [address] Phone: [phone]   Fax:  [phone]  Physical Therapy Treatment   Progress Note Reporting Period  06/01/2018  to 07/09/2018  See note below for Objective Data and Assessment of Progress/Goals.       Patient Details  Name: SIRIS HOOS MRN: [id number] Date of Birth: Dec 11, 1940 Referring Provider: Orpah Melter, MD   Encounter Date: 07/11/2018  PT End of Session - 07/11/18 0947    Visit Number  10    Number of Visits  24    Date for PT Re-Evaluation  07/13/18    PT Start Time  0846    PT Stop Time  0938    PT Time Calculation (min)  52 min    Activity Tolerance  Patient tolerated treatment well    Behavior During Therapy  [facility] for tasks assessed/performed       Past Medical History:  Diagnosis Date  . Acid reflux   . CAD (coronary artery disease)    PCI to Circ with DES on 04/06/15  . CHF (congestive heart failure) ([place])   . Degenerative disc disease, thoracic   . Hyperlipidemia   . Hyperplastic colon polyp   . LBBB (left bundle branch block)   . 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 Nonischemic cardiomyopathy ([place])    a. s/p STJ CRTD 07/2015  . Obesity   . Ventricular fibrillation ([place])    a. appropriate ICD therapy 02/2017    Past Surgical History:  Procedure Laterality Date  . CARDIAC CATHETERIZATION N/A 04/06/2015   Procedure: Right/Left Heart Cath and Coronary Angiography;  Surgeon: Larey Dresser, MD;  Location: [place] [facility];  Service: Cardiovascular;  Laterality: N/A;  . CARDIAC CATHETERIZATION N/A 04/06/2015   Procedure: Coronary Stent Intervention;  Surgeon: Peter M Martinique, MD;  Location: [place] [facility];  Service: Cardiovascular;  Laterality: N/A;  . CATARACT EXTRACTION W/ INTRAOCULAR LENS  IMPLANT, BILATERAL Bilateral 11/2014  . COLONOSCOPY  2009  . EP  IMPLANTABLE DEVICE N/A 08/04/2015   STJ CRTD implanted by Dr Rayann Heman for primary prevention/CHF  . KNEE CARTILAGE SURGERY Left 1960  . SKIN CANCER EXCISION  05/2005; 07/2005   excision of merkel cell  . TONSILLECTOMY      There were no vitals filed for this visit.  Subjective Assessment - 07/11/18 0850    Subjective  I have the pump and it seems to be going okay. I still need to call the pump lady though just to make sure I 'm putting in on okay. I took the bandages off yesterday afternoon.     Pertinent History  Merkel cell tumor removed Lt glute with lymph node invlovement Rt groin and abdomen around 2006.  Pt not remembering if any nodes were positive.  12 weeks of chemotherapy and 5or 6 weeks of radiation , Bil bad hips, pacemaker    Patient Stated Goals  get better garments, treat swelling again    Currently in Pain?  No/denies                       [facility] Adult PT Treatment/Exercise - 07/11/18 0001      Manual Therapy   Manual Lymphatic Drainage (MLD)  in supine; Short neck, superfical and deep abdominals, Rt axillary nodes, Rt inguino-axillilary pathway, then Rt LE from thigh to foot and reversing steps back proximally  Compression Bandaging  Rt LE lotion, thin stockinette from foot to knee, then second piece from knee to groin, artiflex x2 from toes to hip, Elastomull to toes 1-3, 1-6cm  and 8 cm foot wrap, 1 8cm to the lower leg, 2-12cm from knee to thigh and 1 12cm from ankle to thigh                  PT Long Term Goals - 07/11/18 1011      PT LONG TERM GOAL #1   Title  Pt will reduce Rt LE to the following:  20cm proximal to patella: 63, 10cm from patella: 54, 20cm proximal from lateral malleolus: 39    Baseline  65, 59, 43; 62.5, 55, 44.5    Status  Partially Met      PT LONG TERM GOAL #2   Title  Pt will obtain appropriate compression garments for continued containment     Baseline  Awaiting arrival of these-07/11/18    Status  Partially Met       PT LONG TERM GOAL #3   Title  Pt will be knowledgeable about self MLD or use of the pump to enhance compression effects     Status  Achieved            Plan - 07/11/18 0947    Clinical Impression Statement  Continued with complete decongestive therapy to Rt LE and issued phone number of fitter to Mr. Drumwright so he can follow up with her to see when his garment should arrive. He reports bandage felt very good upon leaving session today.     Rehab Potential  Good    Clinical Impairments Affecting Rehab Potential  compliance    PT Frequency  3x / week    PT Duration  6 weeks    PT Treatment/Interventions  ADLs/Self Care Home Management;DME Instruction;Therapeutic exercise;Prosthetic Training;Manual lymph drainage;Compression bandaging    PT Next Visit Plan  Renewal needed next week; continue Rt LE MLD and bandaging; see if pt heard from fitter    Consulted and Agree with Plan of Care  Patient       Patient will benefit from skilled therapeutic intervention in order to improve the following deficits and impairments:  Increased edema, Decreased knowledge of use of DME  Visit Diagnosis: Lymphedema, not elsewhere classified  Merkel cell carcinoma [facility])  Aftercare following surgery for neoplasm     Problem List Patient Active Problem List   Diagnosis Date Noted  . Ventricular tachycardia ([place]) 03/17/2017  . Biventricular ICD (implantable cardioverter-defibrillator) in place 12/14/2016  . 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 Elevated MCV 08/10/2015  . Cough 05/29/2015  . Hyperlipidemia 05/29/2015  . OSA (obstructive sleep apnea) 05/27/2015  . CAD (coronary artery disease), native coronary artery 04/22/2015  . Chronic systolic CHF (congestive heart failure) ([place]) 04/06/2015  . Nonischemic cardiomyopathy ([place]) 03/16/2015  . Chronic systolic dysfunction of left ventricle 03/16/2015  . LBBB (left bundle branch block) 03/16/2015  . Morbid obesity ([place]) 03/16/2015     Otelia Limes, PTA 07/11/2018, 10:14 AM  [place], [place], [address] Phone: [phone]   Fax:  [phone]  Name: HARI CASAUS MRN: [id number] Date of Birth: Sep 12, 1941

## 2018-07-13 ENCOUNTER — Encounter: Payer: Self-pay | Admitting: Physical Therapy

## 2018-07-13 ENCOUNTER — Ambulatory Visit: Payer: Medicare HMO | Admitting: Physical Therapy

## 2018-07-13 DIAGNOSIS — C4A9 Merkel cell carcinoma, unspecified: Secondary | ICD-10-CM | POA: Diagnosis not present

## 2018-07-13 DIAGNOSIS — Z483 Aftercare following surgery for neoplasm: Secondary | ICD-10-CM

## 2018-07-13 DIAGNOSIS — I890 Lymphedema, not elsewhere classified: Secondary | ICD-10-CM | POA: Diagnosis not present

## 2018-07-13 NOTE — Therapy (Signed)
[place], [place], [address] Phone: [phone]   Fax:  [phone]  Physical Therapy Treatment  Patient Details  Name: [name] MRN: [id number] Date of Birth: Sep 24, 1941 Referring Provider: Orpah Melter, MD   Encounter Date: 07/13/2018  PT End of Session - 07/13/18 1218    Visit Number  11    Number of Visits  24    Date for PT Re-Evaluation  07/13/18    PT Start Time  0845    PT Stop Time  0930    PT Time Calculation (min)  45 min    Activity Tolerance  Patient tolerated treatment well    Behavior During Therapy  [facility] for tasks assessed/performed       Past Medical History:  Diagnosis Date  . Acid reflux   . CAD (coronary artery disease)    PCI to Circ with DES on 04/06/15  . CHF (congestive heart failure) ([place])   . Degenerative disc disease, thoracic   . Hyperlipidemia   . Hyperplastic colon polyp   . LBBB (left bundle branch block)   . 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 Nonischemic cardiomyopathy ([place])    a. s/p STJ CRTD 07/2015  . Obesity   . Ventricular fibrillation ([place])    a. appropriate ICD therapy 02/2017    Past Surgical History:  Procedure Laterality Date  . CARDIAC CATHETERIZATION N/A 04/06/2015   Procedure: Right/Left Heart Cath and Coronary Angiography;  Surgeon: Larey Dresser, MD;  Location: [place] [facility];  Service: Cardiovascular;  Laterality: N/A;  . CARDIAC CATHETERIZATION N/A 04/06/2015   Procedure: Coronary Stent Intervention;  Surgeon: Peter M Martinique, MD;  Location: [place] [facility];  Service: Cardiovascular;  Laterality: N/A;  . CATARACT EXTRACTION W/ INTRAOCULAR LENS  IMPLANT, BILATERAL Bilateral 11/2014  . COLONOSCOPY  2009  . EP IMPLANTABLE DEVICE N/A 08/04/2015   STJ CRTD implanted by Dr Rayann Heman for primary prevention/CHF  . KNEE CARTILAGE SURGERY Left 1960  . SKIN CANCER  EXCISION  05/2005; 07/2005   excision of merkel cell  . TONSILLECTOMY      There were no vitals filed for this visit.  Subjective Assessment - 07/13/18 0851    Subjective  Pt has been using the pump when he doesn't have the bandages on.  He got a call about the stocking and it will come in a few days.  He is going to start the hip therapy next week     Pertinent History  Merkel cell tumor removed Lt glute with lymph node invlovement Rt groin and abdomen around 2006.  Pt not remembering if any nodes were positive.  12 weeks of chemotherapy and 5or 6 weeks of radiation , Bil bad hips, pacemaker    Currently in Pain?  No/denies                       [facility] Adult PT Treatment/Exercise - 07/13/18 0001      Manual Therapy   Manual Lymphatic Drainage (MLD)  in supine; Short neck, superfical and deep abdominals, Rt axillary nodes, Rt inguino-axillilary pathway, then Rt LE from thigh to foot and reversing steps back proximally    Compression Bandaging  Rt LE lotion, thin stockinette from foot to knee, then second piece from knee to groin, artiflex x2 from toes to hip, Elastomull to toes 1-3, 1-6cm  and 8 cm  foot wrap, 1 8cm to the lower leg, 2-12cm from knee to thigh and 1 12cm from ankle to thigh                  PT Long Term Goals - 07/11/18 1011      PT LONG TERM GOAL #1   Title  Pt will reduce Rt LE to the following:  20cm proximal to patella: 63, 10cm from patella: 54, 20cm proximal from lateral malleolus: 39    Baseline  65, 59, 43; 62.5, 55, 44.5    Status  Partially Met      PT LONG TERM GOAL #2   Title  Pt will obtain appropriate compression garments for continued containment     Baseline  Awaiting arrival of these-07/11/18    Status  Partially Met      PT LONG TERM GOAL #3   Title  Pt will be knowledgeable about self MLD or use of the pump to enhance compression effects     Status  Achieved            Plan - 07/13/18 1219    Clinical Impression  Statement  Pt continues to do well awaiting delivery of garments.  Talked with pt today [facility] if he notices a difference in his lymphedema.  Pt reports he is exercising and using his pump at home     Rehab Potential  Good    PT Frequency  3x / week    PT Duration  6 weeks    PT Next Visit Plan  Renewal needed next week; continue Rt LE MLD and bandaging; see if pt heard from fitter    Consulted and Agree with Plan of Care  Patient       Patient will benefit from skilled therapeutic intervention in order to improve the following deficits and impairments:  Increased edema, Decreased knowledge of use of DME  Visit Diagnosis: Lymphedema, not elsewhere classified  Aftercare following surgery for neoplasm     Problem List Patient Active Problem List   Diagnosis Date Noted  . Ventricular tachycardia ([place]) 03/17/2017  . Biventricular ICD (implantable cardioverter-defibrillator) in place 12/14/2016  . 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 Elevated MCV 08/10/2015  . Cough 05/29/2015  . Hyperlipidemia 05/29/2015  . OSA (obstructive sleep apnea) 05/27/2015  . CAD (coronary artery disease), native coronary artery 04/22/2015  . Chronic systolic CHF (congestive heart failure) ([place]) 04/06/2015  . Nonischemic cardiomyopathy ([place]) 03/16/2015  . Chronic systolic dysfunction of left ventricle 03/16/2015  . LBBB (left bundle branch block) 03/16/2015  . Morbid obesity ([place]) 03/16/2015   Donato Heinz. Owens Shark PT  Norwood Levo 07/13/2018, 12:21 PM  [place], [place], [address] Phone: [phone]   Fax:  [phone]  Name: [name] MRN: [id number] Date of Birth: [birth year]

## 2018-07-18 ENCOUNTER — Other Ambulatory Visit: Payer: Self-pay | Admitting: Cardiology

## 2018-07-18 DIAGNOSIS — R05 Cough: Secondary | ICD-10-CM

## 2018-07-18 DIAGNOSIS — I428 Other cardiomyopathies: Secondary | ICD-10-CM

## 2018-07-18 DIAGNOSIS — I447 Left bundle-branch block, unspecified: Secondary | ICD-10-CM

## 2018-07-18 DIAGNOSIS — I5022 Chronic systolic (congestive) heart failure: Secondary | ICD-10-CM

## 2018-07-18 DIAGNOSIS — I2510 Atherosclerotic heart disease of native coronary artery without angina pectoris: Secondary | ICD-10-CM

## 2018-07-18 DIAGNOSIS — I519 Heart disease, unspecified: Secondary | ICD-10-CM

## 2018-07-18 DIAGNOSIS — R059 Cough, unspecified: Secondary | ICD-10-CM

## 2018-07-18 DIAGNOSIS — E785 Hyperlipidemia, unspecified: Secondary | ICD-10-CM

## 2018-07-20 ENCOUNTER — Ambulatory Visit: Payer: Medicare HMO | Admitting: Rehabilitation

## 2018-07-21 DIAGNOSIS — I890 Lymphedema, not elsewhere classified: Secondary | ICD-10-CM | POA: Diagnosis not present

## 2018-07-24 ENCOUNTER — Ambulatory Visit: Payer: Medicare HMO

## 2018-07-30 ENCOUNTER — Ambulatory Visit: Payer: Medicare HMO | Attending: Family Medicine | Admitting: Rehabilitation

## 2018-07-30 ENCOUNTER — Encounter: Payer: Self-pay | Admitting: Rehabilitation

## 2018-07-30 DIAGNOSIS — M25551 Pain in right hip: Secondary | ICD-10-CM | POA: Diagnosis present

## 2018-07-30 DIAGNOSIS — M545 Low back pain: Secondary | ICD-10-CM | POA: Diagnosis present

## 2018-07-30 DIAGNOSIS — M25552 Pain in left hip: Secondary | ICD-10-CM | POA: Diagnosis present

## 2018-07-30 DIAGNOSIS — Z483 Aftercare following surgery for neoplasm: Secondary | ICD-10-CM | POA: Diagnosis present

## 2018-07-30 DIAGNOSIS — I890 Lymphedema, not elsewhere classified: Secondary | ICD-10-CM | POA: Diagnosis present

## 2018-07-30 DIAGNOSIS — G8929 Other chronic pain: Secondary | ICD-10-CM

## 2018-07-30 NOTE — Therapy (Signed)
[place], [place], [address] Phone: [phone]   Fax:  [phone]  Physical Therapy Evaluation  Patient Details  Name: [name] MRN: [id number] Date of Birth: 02-Oct-1941 Referring Provider (PT): Dr. Mayer Camel for hip pain   Encounter Date: 07/30/2018  PT End of Session - 07/30/18 1202    Visit Number  12    Number of Visits  24    Date for PT Re-Evaluation  09/10/18    PT Start Time  1017    PT Stop Time  1100    PT Time Calculation (min)  43 min    Activity Tolerance  Patient tolerated treatment well    Behavior During Therapy  [facility] for tasks assessed/performed       Past Medical History:  Diagnosis Date  . Acid reflux   . CAD (coronary artery disease)    PCI to Circ with DES on 04/06/15  . CHF (congestive heart failure) ([place])   . Degenerative disc disease, thoracic   . Hyperlipidemia   . Hyperplastic colon polyp   . LBBB (left bundle branch block)   . 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 Nonischemic cardiomyopathy ([place])    a. s/p STJ CRTD 07/2015  . Obesity   . Ventricular fibrillation ([place])    a. appropriate ICD therapy 02/2017    Past Surgical History:  Procedure Laterality Date  . CARDIAC CATHETERIZATION N/A 04/06/2015   Procedure: Right/Left Heart Cath and Coronary Angiography;  Surgeon: Larey Dresser, MD;  Location: [place] [facility];  Service: Cardiovascular;  Laterality: N/A;  . CARDIAC CATHETERIZATION N/A 04/06/2015   Procedure: Coronary Stent Intervention;  Surgeon: Peter M Martinique, MD;  Location: [place] [facility];  Service: Cardiovascular;  Laterality: N/A;  . CATARACT EXTRACTION W/ INTRAOCULAR LENS  IMPLANT, BILATERAL Bilateral 11/2014  . COLONOSCOPY  2009  . EP IMPLANTABLE DEVICE N/A 08/04/2015   STJ CRTD implanted by Dr Rayann Heman for primary prevention/CHF  . KNEE CARTILAGE SURGERY Left 1960  .  SKIN CANCER EXCISION  05/2005; 07/2005   excision of merkel cell  . TONSILLECTOMY      There were no vitals filed for this visit.   Subjective Assessment - 07/30/18 1035    Subjective  The garment has been doing well.  I just wish it had a slant toe instead of straight across.  I am still trying to get in contact with pump lady.  Talking about the hip; when I walk the glutes just start to hurt.  Onset a few years ago.      Patient is accompained by:  Family member    Pertinent History  Merkel cell tumor removed Lt glute with lymph node invlovement Rt groin and abdomen around 2006.  Pt not remembering if any nodes were positive.  12 weeks of chemotherapy and 5or 6 weeks of radiation , Bil bad hips, pacemaker    Limitations  Standing;Walking    Diagnostic tests  none    Patient Stated Goals  get better garments, treat swelling again, improve the hip pain with walking     Currently in Pain?  Yes    Pain Score  6     Pain Location  Buttocks    Pain Orientation  Right;Left    Pain Descriptors / Indicators  Aching;Sharp    Pain Type  Chronic pain    Pain Radiating Towards  denies  Pain Onset  More than a month ago    Pain Frequency  Intermittent    Aggravating Factors   walking     Pain Relieving Factors  rest,          [facility] PT Assessment - 07/30/18 0001      Assessment   Medical Diagnosis  Bilateral back pain    Referring Provider (PT)  Dr. Mayer Camel for hip pain    Onset Date/Surgical Date  10/24/13    Hand Dominance  Right    Prior Therapy  yes for lymphedema      Precautions   Precaution Comments  cancer      Restrictions   Weight Bearing Restrictions  No      Balance Screen   Has the patient fallen in the past 6 months  No    Has the patient had a decrease in activity level because of a fear of falling?   No    Is the patient reluctant to leave their home because of a fear of falling?   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    Leisure  does not exercise      Cognition   Overall Cognitive Status  Within Functional Limits for tasks assessed      Coordination   Gross Motor Movements are Fluid and Coordinated  Yes      Functional Tests   Functional tests  Single leg stance;Sit to Stand;Squat      Squat   Comments  WNL      Single Leg Stance   Comments  5" bil       Sit to Stand   Comments  ok without hands       Posture/Postural Control   Posture Comments  L LE appearing longer, stands forward flexed,  and needs to bend knees when correcting lumbarspine      ROM / Strength   AROM / PROM / Strength  AROM;Strength      AROM   AROM Assessment Site  Lumbar    Lumbar Flexion  to distal shins    Lumbar Extension  about 20%    Lumbar - Right Side Bend  to knee joint    Lumbar - Left Side Bend  to knee joint    Lumbar - Right Rotation  75%    Lumbar - Left Rotation  75%       Strength   Strength Assessment Site  Hip;Knee    Right/Left Hip  Right;Left    Right Hip Flexion  4+/5    Right Hip External Rotation   4/5    Right Hip Internal Rotation  4/5    Right Hip ABduction  4-/5    Left Hip Flexion  4+/5    Left Hip External Rotation  4/5    Left Hip Internal Rotation  4/5    Left Hip ABduction  4-/5      Flexibility   Soft Tissue Assessment /Muscle Length  yes    Hamstrings  45 bil    Quadriceps  to less than 45deg bil    Piriformis  tightness from hip mobility  Palpation   Palpation comment  slight ttp to bil trochanteric bursitis but no concordant sign pain      Special Tests    Special Tests  Hip Special Tests    Hip Special Tests   Hip Scouring      Hip Scouring   Findings  Negative    Side  Right;Left      Ambulation/Gait   Gait Comments  gait in hallway,  forward flexed at the waist, trendelenburg on Lt leg worse than Rt, mildly antalgic                 Objective measurements completed on examination: See above findings.      [facility] Adult PT Treatment/Exercise - 07/30/18 0001      Manual Therapy   Manual Therapy  Edema management    Edema Management  pt arrives with new thigh high stocking with the knee lines at the mid calf instead of the knee.  Showed pt compression guru thigh high donning video and instructed on how to pull it up as high as possible.                    PT Long Term Goals - 07/30/18 1212      PT LONG TERM GOAL #1   Title  Pt will reduce Rt LE to the following:  20cm proximal to patella: 63, 10cm from patella: 54, 20cm proximal from lateral malleolus: 39    Status  Partially Met      PT LONG TERM GOAL #2   Title  Pt will obtain appropriate compression garments for continued containment     Status  Achieved      PT LONG TERM GOAL #3   Title  Pt will be knowledgeable about self MLD or use of the pump to enhance compression effects     Status  Achieved      PT LONG TERM GOAL #4   Title  pt will be ind with exercises for hip strengthening and mobility     Time  6    Period  Weeks    Status  New    Target Date  09/10/18      PT LONG TERM GOAL #5   Title  pt will be able to improve gait tolerance to no pain while walking x 6 minutes     Time  6    Period  Weeks    Status  New    Target Date  09/10/18      Additional Long Term Goals   Additional Long Term Goals  Yes      PT LONG TERM GOAL #6   Title  Pt will perform standing lumbar extension to at least neutral without having to bend the knees     Time  6    Period  Weeks    Status  New    Target Date  09/10/18             Plan - 07/30/18 1203    Clinical Impression Statement  After reviewing with patient the correct donning method for his thigh high garment to get the alignment correct we started evaluating his hip/back  pain.  His pain is more posterior glute pain and not lateral hip and it only occurs when walking and improved with a forward flexed position which may point to some stenosis type pain contributing.  His deficits include weak hip abductors, tightness in the hamstrings, quads, and hips  into ER/IR, trendelenburg type pain, pain with CPAs and stiffnessgraded 2/5 L4-5 and 3/5 L2-4.  We will continue to address lymphedema as needed but switch focus to pain in the glutes when walking     Rehab Potential  Good    PT Frequency  2x / week    PT Duration  6 weeks    PT Treatment/Interventions  ADLs/Self Care Home Management;DME Instruction;Therapeutic exercise;Prosthetic Training;Manual lymph drainage;Compression bandaging    PT Next Visit Plan  start treatment for glute pain with gait; include hip abd strengthening, teach stretches: hamstrings, quads, piriformis, lumbar joint mobilizations, flexion based strengthening        Patient will benefit from skilled therapeutic intervention in order to improve the following deficits and impairments:  Increased edema, Decreased knowledge of use of DME  Visit Diagnosis: Lymphedema, not elsewhere classified  Aftercare following surgery for neoplasm  Pain in left hip  Pain in right hip  Chronic bilateral low back pain without sciatica     Problem List Patient Active Problem List   Diagnosis Date Noted  . Ventricular tachycardia ([place]) 03/17/2017  . Biventricular ICD (implantable cardioverter-defibrillator) in place 12/14/2016  . 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 Elevated MCV 08/10/2015  . Cough 05/29/2015  . Hyperlipidemia 05/29/2015  . OSA (obstructive sleep apnea) 05/27/2015  . CAD (coronary artery disease), native coronary artery 04/22/2015  . Chronic systolic CHF (congestive heart failure) ([place]) 04/06/2015  . Nonischemic cardiomyopathy ([place]) 03/16/2015  . Chronic systolic dysfunction of left ventricle 03/16/2015  . LBBB (left bundle  branch block) 03/16/2015  . Morbid obesity ([place]) 03/16/2015    Stark Bray 07/30/2018, 12:17 PM  [place], [place], [address] Phone: [phone]   Fax:  [phone]  Name: [name] MRN: [id number] Date of Birth: 02/22/1941

## 2018-08-02 ENCOUNTER — Ambulatory Visit: Payer: Medicare HMO | Admitting: Rehabilitation

## 2018-08-02 ENCOUNTER — Encounter: Payer: Self-pay | Admitting: Rehabilitation

## 2018-08-02 DIAGNOSIS — M545 Low back pain, unspecified: Secondary | ICD-10-CM

## 2018-08-02 DIAGNOSIS — Z483 Aftercare following surgery for neoplasm: Secondary | ICD-10-CM | POA: Diagnosis not present

## 2018-08-02 DIAGNOSIS — M25552 Pain in left hip: Secondary | ICD-10-CM

## 2018-08-02 DIAGNOSIS — M25551 Pain in right hip: Secondary | ICD-10-CM | POA: Diagnosis not present

## 2018-08-02 DIAGNOSIS — G8929 Other chronic pain: Secondary | ICD-10-CM | POA: Diagnosis not present

## 2018-08-02 DIAGNOSIS — I890 Lymphedema, not elsewhere classified: Secondary | ICD-10-CM | POA: Diagnosis not present

## 2018-08-02 NOTE — Patient Instructions (Signed)
Access Code: [email or web address]  URL: [email or web address]  Date: 08/02/2018  Prepared by: [name]   Exercises  Hooklying Hamstring Stretch with Strap - 3 reps - 1 sets - 30 seconds hold - 2x daily - 7x weekly  Supine Lower Trunk Rotation - 5 reps - 1 sets - 6 seconds hold - 2x daily - 7x weekly  Gastroc Stretch on Wall - 3 reps - 1 sets - 20-30seconds hold - 2x daily - 7x weekly  Supine Single Knee to Chest Stretch - 3 reps - 20-30seconds hold - 2x daily - 7x weekly  Supine Figure 4 Piriformis Stretch - 3 reps - 30 seconds hold - 1x daily - 7x weekly

## 2018-08-02 NOTE — Therapy (Signed)
[place], [place], [address] Phone: [phone]   Fax:  [phone]  Physical Therapy Treatment  Patient Details  Name: [name] MRN: [id number] Date of Birth: 02/09/1941 Referring Provider (PT): Dr. Mayer Camel for hip pain   Encounter Date: 08/02/2018  PT End of Session - 08/02/18 1344    Visit Number  13    Number of Visits  24    Date for PT Re-Evaluation  09/10/18    PT Start Time  1300    PT Stop Time  1344    PT Time Calculation (min)  44 min    Activity Tolerance  Patient tolerated treatment well    Behavior During Therapy  [facility] for tasks assessed/performed       Past Medical History:  Diagnosis Date  . Acid reflux   . CAD (coronary artery disease)    PCI to Circ with DES on 04/06/15  . CHF (congestive heart failure) ([place])   . Degenerative disc disease, thoracic   . Hyperlipidemia   . Hyperplastic colon polyp   . LBBB (left bundle branch block)   . 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 Nonischemic cardiomyopathy ([place])    a. s/p STJ CRTD 07/2015  . Obesity   . Ventricular fibrillation ([place])    a. appropriate ICD therapy 02/2017    Past Surgical History:  Procedure Laterality Date  . CARDIAC CATHETERIZATION N/A 04/06/2015   Procedure: Right/Left Heart Cath and Coronary Angiography;  Surgeon: Larey Dresser, MD;  Location: [facility];  Service: Cardiovascular;  Laterality: N/A;  . CARDIAC CATHETERIZATION N/A 04/06/2015   Procedure: Coronary Stent Intervention;  Surgeon: Peter M Martinique, MD;  Location: [facility];  Service: Cardiovascular;  Laterality: N/A;  . CATARACT EXTRACTION W/ INTRAOCULAR LENS  IMPLANT, BILATERAL Bilateral 11/2014  . COLONOSCOPY  2009  . EP IMPLANTABLE DEVICE N/A 08/04/2015   STJ CRTD implanted by Dr Rayann Heman for primary prevention/CHF  . KNEE CARTILAGE SURGERY Left 1960  .  SKIN CANCER EXCISION  05/2005; 07/2005   excision of merkel cell  . TONSILLECTOMY      There were no vitals filed for this visit.  Subjective Assessment - 08/02/18 1305    Subjective  no pain today     Pertinent History  Merkel cell tumor removed Lt glute with lymph node invlovement Rt groin and abdomen around 2006.  Pt not remembering if any nodes were positive.  12 weeks of chemotherapy and 5or 6 weeks of radiation , Bil bad hips, pacemaker    Patient Stated Goals  get better garments, treat swelling again, improve the hip pain with walking     Currently in Pain?  Yes    Pain Score  2     Pain Location  Buttocks    Pain Orientation  Right;Left    Pain Descriptors / Indicators  Aching;Sharp    Pain Type  Chronic pain    Pain Frequency  Intermittent    Aggravating Factors   walking     Pain Relieving Factors  rest                       [facility] Adult PT Treatment/Exercise - 08/02/18 0001      Exercises   Exercises  Knee/Hip      Knee/Hip Exercises: Stretches   Passive Hamstring Stretch  Both;2 reps;30 seconds  Passive Hamstring Stretch Limitations  with strap    Hip Flexor Stretch  Both;2 reps;30 seconds    Hip Flexor Stretch Limitations  SKTC    Piriformis Stretch  Both;2 reps;20 seconds      Knee/Hip Exercises: Aerobic   Stationary Bike  level 3 x 70mn    Tread Mill  gait on TM focus on soft steps heel to toe x 336m 1.5 mph with hand support; no pain elicited       Knee/Hip Exercises: Standing   Lateral Step Up  10 reps;Hand Hold: 1;Step Height: 6"    Lateral Step Up Limitations  using one stick for support;  focus on hips not dropping    SLS  alternating with stick 5" each x 10 each focusing on minimizing hip drop              PT Education - 08/02/18 1344    Education Details  stretching HEP    Person(s) Educated  Patient    Methods  Explanation;Demonstration;Tactile cues;Verbal cues;Handout    Comprehension  Verbalized understanding;Returned  demonstration;Verbal cues required          PT Long Term Goals - 07/30/18 1212      PT LONG TERM GOAL #1   Title  Pt will reduce Rt LE to the following:  20cm proximal to patella: 63, 10cm from patella: 54, 20cm proximal from lateral malleolus: 39    Status  Partially Met      PT LONG TERM GOAL #2   Title  Pt will obtain appropriate compression garments for continued containment     Status  Achieved      PT LONG TERM GOAL #3   Title  Pt will be knowledgeable about self MLD or use of the pump to enhance compression effects     Status  Achieved      PT LONG TERM GOAL #4   Title  pt will be ind with exercises for hip strengthening and mobility     Time  6    Period  Weeks    Status  New    Target Date  09/10/18      PT LONG TERM GOAL #5   Title  pt will be able to improve gait tolerance to no pain while walking x 6 minutes     Time  6    Period  Weeks    Status  New    Target Date  09/10/18      Additional Long Term Goals   Additional Long Term Goals  Yes      PT LONG TERM GOAL #6   Title  Pt will perform standing lumbar extension to at least neutral without having to bend the knees     Time  6    Period  Weeks    Status  New    Target Date  09/10/18            Plan - 08/02/18 1344    Clinical Impression Statement  Focus on LE stretching, gait with hip activation, and glute strengthening today with pt demonstrating trendelenburg gait bilaterally L>R in single leg stance.  Very tight in bilateral hips  Lt>Rt . Pt reports no pain in the back after going to get up after treatment today.      PT Frequency  2x / week    PT Duration  6 weeks    PT Treatment/Interventions  ADLs/Self Care Home Management;DME Instruction;Therapeutic exercise;Prosthetic Training;Manual lymph drainage;Compression bandaging  PT Next Visit Plan  continue gait, hip strength, stretches go?, lumbar/core strength and mobilization    PT Home Exercise Plan  Access Code: [email or web address] hip stretches      Consulted and Agree with Plan of Care  Patient       Patient will benefit from skilled therapeutic intervention in order to improve the following deficits and impairments:  Increased edema, Decreased knowledge of use of DME  Visit Diagnosis: Pain in left hip  Pain in right hip  Chronic bilateral low back pain without sciatica     Problem List Patient Active Problem List   Diagnosis Date Noted  . Ventricular tachycardia ([place]) 03/17/2017  . Biventricular ICD (implantable cardioverter-defibrillator) in place 12/14/2016  . 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 Elevated MCV 08/10/2015  . Cough 05/29/2015  . Hyperlipidemia 05/29/2015  . OSA (obstructive sleep apnea) 05/27/2015  . CAD (coronary artery disease), native coronary artery 04/22/2015  . Chronic systolic CHF (congestive heart failure) ([place]) 04/06/2015  . Nonischemic cardiomyopathy ([place]) 03/16/2015  . Chronic systolic dysfunction of left ventricle 03/16/2015  . LBBB (left bundle branch block) 03/16/2015  . Morbid obesity ([place]) 03/16/2015   [name], PT 08/02/2018, 8:32 PM  [place], [place], [address] Phone: [phone]   Fax:  [phone]  Name: [name] MRN: [id number] Date of Birth: 02-Oct-1941

## 2018-08-05 ENCOUNTER — Other Ambulatory Visit: Payer: Self-pay | Admitting: Cardiology

## 2018-08-05 DIAGNOSIS — I5022 Chronic systolic (congestive) heart failure: Secondary | ICD-10-CM

## 2018-08-07 ENCOUNTER — Ambulatory Visit: Payer: Medicare HMO

## 2018-08-07 DIAGNOSIS — M545 Low back pain, unspecified: Secondary | ICD-10-CM

## 2018-08-07 DIAGNOSIS — G8929 Other chronic pain: Secondary | ICD-10-CM

## 2018-08-07 DIAGNOSIS — M25551 Pain in right hip: Secondary | ICD-10-CM

## 2018-08-07 DIAGNOSIS — M25552 Pain in left hip: Secondary | ICD-10-CM | POA: Diagnosis not present

## 2018-08-07 DIAGNOSIS — Z483 Aftercare following surgery for neoplasm: Secondary | ICD-10-CM | POA: Diagnosis not present

## 2018-08-07 DIAGNOSIS — I890 Lymphedema, not elsewhere classified: Secondary | ICD-10-CM | POA: Diagnosis not present

## 2018-08-07 NOTE — Therapy (Signed)
[place], [place], [address] Phone: [phone]   Fax:  [phone]  Physical Therapy Treatment  Patient Details  Name: [name] MRN: [id number] Date of Birth: [birth year] Referring Provider (PT): Dr. Mayer Camel for hip pain   Encounter Date: 08/07/2018  PT End of Session - 08/07/18 1016    Visit Number  14    Number of Visits  24    Date for PT Re-Evaluation  09/10/18    PT Start Time  0937    PT Stop Time  1017    PT Time Calculation (min)  40 min    Activity Tolerance  Patient tolerated treatment well    Behavior During Therapy  [facility] for tasks assessed/performed       Past Medical History:  Diagnosis Date  . Acid reflux   . CAD (coronary artery disease)    PCI to Circ with DES on 04/06/15  . CHF (congestive heart failure) ([place])   . Degenerative disc disease, thoracic   . Hyperlipidemia   . Hyperplastic colon polyp   . LBBB (left bundle branch block)   . 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 Nonischemic cardiomyopathy ([place])    a. s/p STJ CRTD 07/2015  . Obesity   . Ventricular fibrillation ([place])    a. appropriate ICD therapy 02/2017    Past Surgical History:  Procedure Laterality Date  . CARDIAC CATHETERIZATION N/A 04/06/2015   Procedure: Right/Left Heart Cath and Coronary Angiography;  Surgeon: Larey Dresser, MD;  Location: [facility];  Service: Cardiovascular;  Laterality: N/A;  . CARDIAC CATHETERIZATION N/A 04/06/2015   Procedure: Coronary Stent Intervention;  Surgeon: Peter M Martinique, MD;  Location: [facility];  Service: Cardiovascular;  Laterality: N/A;  . CATARACT EXTRACTION W/ INTRAOCULAR LENS  IMPLANT, BILATERAL Bilateral 11/2014  . COLONOSCOPY  2009  . EP IMPLANTABLE DEVICE N/A 08/04/2015   STJ CRTD implanted by Dr Rayann Heman for primary prevention/CHF  . KNEE CARTILAGE SURGERY Left 1960  .  SKIN CANCER EXCISION  05/2005; 07/2005   excision of merkel cell  . TONSILLECTOMY      There were no vitals filed for this visit.  Subjective Assessment - 08/07/18 0940    Subjective  My low back is sore today, I think from just doing the new stretches [name] gave me last time. I did them Friday and was really sore in my low back Saturday so didn't do them the rest of the weekend and then tried a few again yesterday.      Pertinent History  Merkel cell tumor removed Lt glute with lymph node invlovement Rt groin and abdomen around 2006.  Pt not remembering if any nodes were positive.  12 weeks of chemotherapy and 5or 6 weeks of radiation , Bil bad hips, pacemaker    Patient Stated Goals  get better garments, treat swelling again, improve the hip pain with walking     Currently in Pain?  No/denies   just sore in low back                      [facility] Adult PT Treatment/Exercise - 08/07/18 0001      Neuro Re-ed    Neuro Re-ed Details   Step up with 6" step using dowel for support 2 x 5 each; then bil tandem stance practice at back of bike 2 x  30 sec each with +1 UE fingertip support. VCs throughout for erect posture      Lumbar Exercises: Stretches   Lower Trunk Rotation  2 reps;30 seconds    Lower Trunk Rotation Limitations  tactile cues for end ROM    Pelvic Tilt  10 reps;5 seconds    Pelvic Tilt Limitations  Tactile and VCs throughout, pt able to mostly perform correct technique, but limited ROM due to tightness      Knee/Hip Exercises: Stretches   Passive Hamstring Stretch  Both;2 reps;30 seconds    Passive Hamstring Stretch Limitations  with towel; then also done in sitting    Hip Flexor Stretch  Both;2 reps;30 seconds    Hip Flexor Stretch Limitations  SKTC    Piriformis Stretch  Both;2 reps;20 seconds    Piriformis Stretch Limitations  with towel on Rt; then also done in sitting but with Rt only, unable with Lt                  PT Long Term Goals -  07/30/18 1212      PT LONG TERM GOAL #1   Title  Pt will reduce Rt LE to the following:  20cm proximal to patella: 63, 10cm from patella: 54, 20cm proximal from lateral malleolus: 39    Status  Partially Met      PT LONG TERM GOAL #2   Title  Pt will obtain appropriate compression garments for continued containment     Status  Achieved      PT LONG TERM GOAL #3   Title  Pt will be knowledgeable about self MLD or use of the pump to enhance compression effects     Status  Achieved      PT LONG TERM GOAL #4   Title  pt will be ind with exercises for hip strengthening and mobility     Time  6    Period  Weeks    Status  New    Target Date  09/10/18      PT LONG TERM GOAL #5   Title  pt will be able to improve gait tolerance to no pain while walking x 6 minutes     Time  6    Period  Weeks    Status  New    Target Date  09/10/18      Additional Long Term Goals   Additional Long Term Goals  Yes      PT LONG TERM GOAL #6   Title  Pt will perform standing lumbar extension to at least neutral without having to bend the knees     Time  6    Period  Weeks    Status  New    Target Date  09/10/18            Plan - 08/07/18 1017    Clinical Impression Statement  Continued with focus to LE stretching and worked towards incorporating core stabs, but pt had difficulty mastering correct technique of pelvic tilt so could not progress past this point today. Reviewed all HEP issued at last session as pt reports only fuly doing them at home one time. He did require tatctile cuing for technique for some, see flowsheet. Overall pt reports feeling looser and less sore at end of session. Also briefly focused on balance as time allowed.     Rehab Potential  Good    Clinical Impairments Affecting Rehab Potential  compliance    PT Frequency  2x / week    PT Duration  6 weeks    PT Treatment/Interventions  ADLs/Self Care Home Management;DME Instruction;Therapeutic exercise;Prosthetic  Training;Manual lymph drainage;Compression bandaging    PT Next Visit Plan  continue gait, hip strength, stretches go?, lumbar/core strength and mobilization    PT Home Exercise Plan  Access Code: [email or web address] hip stretches and added pelvic tilt    Consulted and Agree with Plan of Care  Patient       Patient will benefit from skilled therapeutic intervention in order to improve the following deficits and impairments:  Increased edema, Decreased knowledge of use of DME  Visit Diagnosis: Pain in left hip  Pain in right hip  Chronic bilateral low back pain without sciatica     Problem List Patient Active Problem List   Diagnosis Date Noted  . Ventricular tachycardia ([place]) 03/17/2017  . Biventricular ICD (implantable cardioverter-defibrillator) in place 12/14/2016  . 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 Elevated MCV 08/10/2015  . Cough 05/29/2015  . Hyperlipidemia 05/29/2015  . OSA (obstructive sleep apnea) 05/27/2015  . CAD (coronary artery disease), native coronary artery 04/22/2015  . Chronic systolic CHF (congestive heart failure) ([place]) 04/06/2015  . Nonischemic cardiomyopathy ([place]) 03/16/2015  . Chronic systolic dysfunction of left ventricle 03/16/2015  . LBBB (left bundle branch block) 03/16/2015  . Morbid obesity ([place]) 03/16/2015    Otelia Limes, PTA 08/07/2018, 10:21 AM  [place], [place], [address] Phone: [phone]   Fax:  [phone]  Name: [name] MRN: [id number] Date of Birth: July 13, 1941

## 2018-08-09 ENCOUNTER — Ambulatory Visit: Payer: Medicare HMO

## 2018-08-09 DIAGNOSIS — M545 Low back pain: Secondary | ICD-10-CM | POA: Diagnosis not present

## 2018-08-09 DIAGNOSIS — I890 Lymphedema, not elsewhere classified: Secondary | ICD-10-CM | POA: Diagnosis not present

## 2018-08-09 DIAGNOSIS — M25552 Pain in left hip: Secondary | ICD-10-CM | POA: Diagnosis not present

## 2018-08-09 DIAGNOSIS — G8929 Other chronic pain: Secondary | ICD-10-CM

## 2018-08-09 DIAGNOSIS — Z483 Aftercare following surgery for neoplasm: Secondary | ICD-10-CM | POA: Diagnosis not present

## 2018-08-09 DIAGNOSIS — M25551 Pain in right hip: Secondary | ICD-10-CM | POA: Diagnosis not present

## 2018-08-09 NOTE — Therapy (Signed)
[place], [place], [address] Phone: [phone]   Fax:  [phone]  Physical Therapy Treatment  Patient Details  Name: [name] MRN: [id number] Date of Birth: 1941-01-15 Referring Provider (PT): Dr. Mayer Camel for hip pain   Encounter Date: 08/09/2018  PT End of Session - 08/09/18 1014    Visit Number  15    Number of Visits  24    Date for PT Re-Evaluation  09/10/18    PT Start Time  0928    PT Stop Time  1014    PT Time Calculation (min)  46 min    Activity Tolerance  Patient tolerated treatment well    Behavior During Therapy  [facility] for tasks assessed/performed       Past Medical History:  Diagnosis Date  . Acid reflux   . CAD (coronary artery disease)    PCI to Circ with DES on 04/06/15  . CHF (congestive heart failure) ([place])   . Degenerative disc disease, thoracic   . Hyperlipidemia   . Hyperplastic colon polyp   . LBBB (left bundle branch block)   . 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 Nonischemic cardiomyopathy ([place])    a. s/p STJ CRTD 07/2015  . Obesity   . Ventricular fibrillation ([place])    a. appropriate ICD therapy 02/2017    Past Surgical History:  Procedure Laterality Date  . CARDIAC CATHETERIZATION N/A 04/06/2015   Procedure: Right/Left Heart Cath and Coronary Angiography;  Surgeon: Larey Dresser, MD;  Location: [facility];  Service: Cardiovascular;  Laterality: N/A;  . CARDIAC CATHETERIZATION N/A 04/06/2015   Procedure: Coronary Stent Intervention;  Surgeon: Peter M Martinique, MD;  Location: [facility];  Service: Cardiovascular;  Laterality: N/A;  . CATARACT EXTRACTION W/ INTRAOCULAR LENS  IMPLANT, BILATERAL Bilateral 11/2014  . COLONOSCOPY  2009  . EP IMPLANTABLE DEVICE N/A 08/04/2015   STJ CRTD implanted by Dr Rayann Heman for primary prevention/CHF  . KNEE CARTILAGE SURGERY Left 1960  .  SKIN CANCER EXCISION  05/2005; 07/2005   excision of merkel cell  . TONSILLECTOMY      There were no vitals filed for this visit.  Subjective Assessment - 08/09/18 0936    Subjective  My low back is still sore from the new stretches but it is improving. I can tell the tightness is getting better as well.     Pertinent History  Merkel cell tumor removed Lt glute with lymph node invlovement Rt groin and abdomen around 2006.  Pt not remembering if any nodes were positive.  12 weeks of chemotherapy and 5or 6 weeks of radiation , Bil bad hips, pacemaker    Patient Stated Goals  get better garments, treat swelling again, improve the hip pain with walking     Currently in Pain?  No/denies                       [facility] Adult PT Treatment/Exercise - 08/09/18 0001      Lumbar Exercises: Supine   Pelvic Tilt  10 reps   5 sec holds   Clam  5 reps   2 x 5   Heel Slides  5 reps    Heel Slides Limitations  VCs throughout for correct pelvic tilt/addominal engagement    Bent Knee Raise  10 reps    Bridge  10 reps  2 x 5     Knee/Hip Exercises: Stretches   Passive Hamstring Stretch  Right;Left;2 reps;30 seconds    Passive Hamstring Stretch Limitations  with towel; then also done sitting edge of bed    Hip Flexor Stretch  Right;Left;2 reps;30 seconds   leg off edge of mat and foot on floor   Piriformis Stretch  Right;Left;2 reps;30 seconds      Knee/Hip Exercises: Aerobic   Nustep  Level 7, x10 minutes                  PT Long Term Goals - 07/30/18 1212      PT LONG TERM GOAL #1   Title  Pt will reduce Rt LE to the following:  20cm proximal to patella: 63, 10cm from patella: 54, 20cm proximal from lateral malleolus: 39    Status  Partially Met      PT LONG TERM GOAL #2   Title  Pt will obtain appropriate compression garments for continued containment     Status  Achieved      PT LONG TERM GOAL #3   Title  Pt will be knowledgeable about self MLD or use of the  pump to enhance compression effects     Status  Achieved      PT LONG TERM GOAL #4   Title  pt will be ind with exercises for hip strengthening and mobility     Time  6    Period  Weeks    Status  New    Target Date  09/10/18      PT LONG TERM GOAL #5   Title  pt will be able to improve gait tolerance to no pain while walking x 6 minutes     Time  6    Period  Weeks    Status  New    Target Date  09/10/18      Additional Long Term Goals   Additional Long Term Goals  Yes      PT LONG TERM GOAL #6   Title  Pt will perform standing lumbar extension to at least neutral without having to bend the knees     Time  6    Period  Weeks    Status  New    Target Date  09/10/18            Plan - 08/09/18 1015    Clinical Impression Statement  Added NuStep today which pt tolerated well. Continued with hip and low back flexibility which pt is progrssing well by being able to stretch further. Also progressed core strength today with pelvic tilts whihc pt was performing with better technique today and reports feeling abdominal muscles working. Pts posture was much more erect upon leaving therapy today and he reported feeling much looser at low back and hips.     Rehab Potential  Good    Clinical Impairments Affecting Rehab Potential  compliance    PT Frequency  2x / week    PT Duration  6 weeks    PT Treatment/Interventions  ADLs/Self Care Home Management;DME Instruction;Therapeutic exercise;Prosthetic Training;Manual lymph drainage;Compression bandaging    PT Next Visit Plan  continue gait, hip strength, stretches go?, lumbar/core strength and mobilization; add pelvic tilts/lumabr stabs to HEP if pt still performing with good technique    Consulted and Agree with Plan of Care  Patient       Patient will benefit from skilled therapeutic intervention in order to improve the  following deficits and impairments:  Increased edema, Decreased knowledge of use of DME  Visit Diagnosis: Pain in  left hip  Pain in right hip  Chronic bilateral low back pain without sciatica     Problem List Patient Active Problem List   Diagnosis Date Noted  . Ventricular tachycardia ([place]) 03/17/2017  . Biventricular ICD (implantable cardioverter-defibrillator) in place 12/14/2016  . 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 Elevated MCV 08/10/2015  . Cough 05/29/2015  . Hyperlipidemia 05/29/2015  . OSA (obstructive sleep apnea) 05/27/2015  . CAD (coronary artery disease), native coronary artery 04/22/2015  . Chronic systolic CHF (congestive heart failure) ([place]) 04/06/2015  . Nonischemic cardiomyopathy ([place]) 03/16/2015  . Chronic systolic dysfunction of left ventricle 03/16/2015  . LBBB (left bundle branch block) 03/16/2015  . Morbid obesity ([place]) 03/16/2015    Otelia Limes, PTA 08/09/2018, 10:19 AM  [place], [place], [address] Phone: [phone]   Fax:  [phone]  Name: [name] MRN: [id number] Date of Birth: 1941/10/15

## 2018-08-10 ENCOUNTER — Encounter: Payer: Self-pay | Admitting: Cardiology

## 2018-08-10 ENCOUNTER — Ambulatory Visit: Payer: Medicare HMO | Admitting: Cardiology

## 2018-08-10 VITALS — BP 124/74 | HR 92 | Ht 71.0 in | Wt 275.0 lb

## 2018-08-10 DIAGNOSIS — I447 Left bundle-branch block, unspecified: Secondary | ICD-10-CM | POA: Diagnosis not present

## 2018-08-10 DIAGNOSIS — E782 Mixed hyperlipidemia: Secondary | ICD-10-CM

## 2018-08-10 DIAGNOSIS — C4A72 Merkel cell carcinoma of left lower limb, including hip: Secondary | ICD-10-CM | POA: Insufficient documentation

## 2018-08-10 DIAGNOSIS — Z95810 Presence of automatic (implantable) cardiac defibrillator: Secondary | ICD-10-CM

## 2018-08-10 DIAGNOSIS — I110 Hypertensive heart disease with heart failure: Secondary | ICD-10-CM

## 2018-08-10 DIAGNOSIS — I428 Other cardiomyopathies: Secondary | ICD-10-CM

## 2018-08-10 LAB — COMPREHENSIVE METABOLIC PANEL
ALT: 19 IU/L (ref 0–44)
AST: 17 IU/L (ref 0–40)
Albumin/Globulin Ratio: 2.3 — ABNORMAL HIGH (ref 1.2–2.2)
Albumin: 4.5 g/dL (ref 3.5–4.8)
Alkaline Phosphatase: 62 IU/L (ref 39–117)
BUN/Creatinine Ratio: 15 (ref 10–24)
BUN: 17 mg/dL (ref 8–27)
Bilirubin Total: 0.6 mg/dL (ref 0.0–1.2)
CO2: 23 mmol/L (ref 20–29)
Calcium: 9.4 mg/dL (ref 8.6–10.2)
Chloride: 103 mmol/L (ref 96–106)
Creatinine, Ser: 1.16 mg/dL (ref 0.76–1.27)
GFR calc Af Amer: 70 mL/min/{1.73_m2} (ref >59)
GFR calc non Af Amer: 60 mL/min/{1.73_m2} (ref >59)
Globulin, Total: 2 g/dL (ref 1.5–4.5)
Glucose: 71 mg/dL (ref 65–99)
Potassium: 4 mmol/L (ref 3.5–5.2)
Sodium: 143 mmol/L (ref 134–144)
Total Protein: 6.5 g/dL (ref 6.0–8.5)

## 2018-08-10 LAB — CBC WITH DIFFERENTIAL/PLATELET
Basophils Absolute: 0 10*3/uL (ref 0.0–0.2)
Basos: 0 %
EOS (ABSOLUTE): 0.1 10*3/uL (ref 0.0–0.4)
Eos: 4 %
Hematocrit: 39.9 % (ref 37.5–51.0)
Hemoglobin: 13.8 g/dL (ref 13.0–17.7)
Immature Grans (Abs): 0 10*3/uL (ref 0.0–0.1)
Immature Granulocytes: 0 %
Lymphocytes Absolute: 0.7 10*3/uL (ref 0.7–3.1)
Lymphs: 19 %
MCH: 34.8 pg — ABNORMAL HIGH (ref 26.6–33.0)
MCHC: 34.6 g/dL (ref 31.5–35.7)
MCV: 101 fL — ABNORMAL HIGH (ref 79–97)
Monocytes Absolute: 0.7 10*3/uL (ref 0.1–0.9)
Monocytes: 17 %
Neutrophils Absolute: 2.4 10*3/uL (ref 1.4–7.0)
Neutrophils: 60 %
Platelets: 137 10*3/uL — ABNORMAL LOW (ref 150–450)
RBC: 3.97 x10E6/uL — ABNORMAL LOW (ref 4.14–5.80)
RDW: 13.4 % (ref 12.3–15.4)
WBC: 3.9 10*3/uL (ref 3.4–10.8)

## 2018-08-10 LAB — LIPID PANEL
Chol/HDL Ratio: 2 ratio (ref 0.0–5.0)
Cholesterol, Total: 92 mg/dL — ABNORMAL LOW (ref 100–199)
HDL: 46 mg/dL (ref >39)
LDL Calculated: 35 mg/dL (ref 0–99)
Triglycerides: 55 mg/dL (ref 0–149)
VLDL Cholesterol Cal: 11 mg/dL (ref 5–40)

## 2018-08-10 LAB — TSH: TSH: 2.91 u[IU]/mL (ref 0.450–4.500)

## 2018-08-10 NOTE — Patient Instructions (Signed)
Medication Instructions:   Your physician recommends that you continue on your current medications as directed. Please refer to the Current Medication list given to you today.  If you need a refill on your cardiac medications before your next appointment, please call your pharmacy.     Lab work:  TODAY--CMET, CBC W DIFF, TSH, AND LIPIDS  If you have labs (blood work) drawn today and your tests are completely normal, you will receive your results only by: . MyChart Message (if you have MyChart) OR . A paper copy in the mail If you have any lab test that is abnormal or we need to change your treatment, we will call you to review the results.    Follow-Up: At [facility], you and your health needs are our priority.  As part of our continuing mission to provide you with exceptional heart care, we have created designated Provider Care Teams.  These Care Teams include your primary Cardiologist (physician) and Advanced Practice Providers (APPs -  Physician Assistants and Nurse Practitioners) who all work together to provide you with the care you need, when you need it. You will need a follow up appointment in 6 months.  Please call our office 2 months in advance to schedule this appointment.  You may see Katarina Nelson, MD or one of the following Advanced Practice Providers on your designated Care Team:   Brittainy Simmons, PA-C Dayna Dunn, PA-C . Michele Lenze, PA-C     

## 2018-08-10 NOTE — Progress Notes (Signed)
Cardiology Office Note   Date:  08/10/2018   ID:  Kalvin, Buss 11-27-1940, MRN [id number]  PCP:  Orpah Melter, MD  Cardiologist:  Dr Meda Coffee Primary Electrophysiologist: Ena Dawley, MD    Chief complain: 1 year follow up   History of Present Illness: [name] is a 77 y.o. male who presents today for electrophysiology follow-up.    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  He reports that he began having symptoms of SOB and DOE 12/15.  His symptoms progressed.  He underwent echo 11/11/14 which revealed EF 25% with moderate MR and moderate LA enlargement.  He had a myoview 2/16 that showed no prior scar and no ischemia but dilated left ventricle with moderately to severely impaired systolic function.  He has been treated with an optimal medical regimen without improvement.  He has also recently had PCI 6/16.  Despite revascularization, echo 07/01/15 reveals EF 20-25%. He continues to have SOB with moderate activity.   He has stable LE lymphedema, controlled by a low dose lasix.     05/04/2016 - 9 months follow up, he underwent BiV ICD implantation in 07/2016 with improvement of his symptoms, having more energy. 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  Denies LE edema, some only when non-compliant to low salt diet, wears compression stockings, denies orthopnea, PND, and reports being out of energy only when he avoids going to the gym. No ICD shocks, some "palpitations" when laying on the left side, improved after ICD reprogramming.   12/14/2016 - the patient is coming after 6 months, he's been doing well until 3 weeks ago when he developed flu that lead to an ER visit. He is slowly recovering. He denies any lower extremity edema orthopnea or paroxysmal nocturnal dyspnea. No palpitations or ICD  discharges. He also denies chest pain he feels very tired and has worsening dyspnea on exertion.  03/15/2017 - 3 months follow up, the patient has recovered from flu, he continues to work full time and walks most of the time at work. He denies any DOE, no CP, palpitations, dizziness or syncope. No ICD firing. He has been complaint with his meds. No LE edema, orthopnea, PND.   08/10/2018 -this is a follow-up after almost a year and half, the patient has been doing well, he denies any chest pain shortness of breath, no lower extremity edema orthopnea proximal nocturnal dyspnea.  In fact he underwent lymphedema therapy at the outpatient rehab center of [facility] with [name] with significant improvement in his bilateral lymphedema.  These have been after cancer treatment and removal of lymph nodes in years ago.  He denies any palpitations, no ICD firing, no syncope.   Past Medical History:  Diagnosis Date  . Acid reflux   . CAD (coronary artery disease)    PCI to Circ with DES on 04/06/15  . CHF (congestive heart failure) ([place])   . Degenerative disc disease, thoracic   . Hyperlipidemia   . Hyperplastic colon polyp   . LBBB (left bundle branch block)   . Merkel cell cancer ([place]) 05/2005; 07-2005   s/p gluteal resection, lymph node dissection, chemo/ radiation  . Nonischemic cardiomyopathy ([place])    a. s/p STJ CRTD 07/2015  . Obesity   . Ventricular fibrillation ([place])    a. appropriate ICD  therapy 02/2017   Past Surgical History:  Procedure Laterality Date  . CARDIAC CATHETERIZATION N/A 04/06/2015   Procedure: Right/Left Heart Cath and Coronary Angiography;  Surgeon: Larey Dresser, MD;  Location: [place] [facility];  Service: Cardiovascular;  Laterality: N/A;  . CARDIAC CATHETERIZATION N/A 04/06/2015   Procedure: Coronary Stent Intervention;  Surgeon: Peter M Martinique, MD;  Location: [place] [facility];  Service: Cardiovascular;  Laterality: N/A;  . CATARACT EXTRACTION W/ INTRAOCULAR LENS   IMPLANT, BILATERAL Bilateral 11/2014  . COLONOSCOPY  2009  . EP IMPLANTABLE DEVICE N/A 08/04/2015   STJ CRTD implanted by Dr Rayann Heman for primary prevention/CHF  . KNEE CARTILAGE SURGERY Left 1960  . SKIN CANCER EXCISION  05/2005; 07/2005   excision of merkel cell  . TONSILLECTOMY       Current Outpatient Medications  Medication Sig Dispense Refill  . aspirin 81 MG tablet Take 81 mg by mouth daily.    Marland Kitchen atorvastatin (LIPITOR) 80 MG tablet Take 1 tablet (80 mg total) by mouth daily. Please keep upcoming appt in October with Dr. Meda Coffee for future refills. Thank you 90 tablet 0  . carvedilol (COREG) 12.5 MG tablet Take 1 tablet (12.5 mg total) by mouth 2 (two) times daily with a meal. 30 tablet 0  . fluorouracil (EFUDEX) 5 % cream Apply 5 application topically daily as needed (Patches on face).   0  . furosemide (LASIX) 20 MG tablet Take 1 tablet (20 mg total) by mouth 2 (two) times daily. Please keep upcoming appt in October with Dr. Meda Coffee for future refills. Thank you 180 tablet 0  . levothyroxine (SYNTHROID, LEVOTHROID) 50 MCG tablet Take 50 mcg by mouth daily.  5  . losartan (COZAAR) 25 MG tablet Take 1 tablet (25 mg total) by mouth daily. Please keep upcoming appt in October with Dr. Meda Coffee for future refills. Thank you 90 tablet 0  . POTASSIUM PO Take 550 mg by mouth daily as needed (Potassium supplement).     Marland Kitchen spironolactone (ALDACTONE) 25 MG tablet TAKE 1/2 TAB BY MOUTH EVERY DAY KEEP UPCOMING APPT IN OCTOBER 15 tablet 0  . acetaminophen (TYLENOL) 325 MG tablet Take 650 mg by mouth every 6 (six) hours as needed for mild pain or moderate pain.    Marland Kitchen ibuprofen (ADVIL,MOTRIN) 200 MG tablet Take 200 mg by mouth 2 (two) times daily as needed (pain).    . nitroGLYCERIN (NITROSTAT) 0.4 MG SL tablet Place 1 tablet (0.4 mg total) under the tongue every 5 (five) minutes as needed for chest pain. (Patient not taking: Reported on 08/10/2018) 25 tablet 6   No current facility-administered medications  for this visit.    Facility-Administered Medications Ordered in Other Visits  Medication Dose Route Frequency Provider Last Rate Last Dose  . adenosine (diagnostic) (ADENOSCAN) infusion 68.4 mg  0.56 mg/kg Intravenous Once Crenshaw, Denice Bors, MD        Allergies:   Tape   Social History:  The patient  reports that he quit smoking about 34 years ago. His smoking use included cigarettes. He has a 3.75 pack-year smoking history. He has never used smokeless tobacco. He reports that he drinks alcohol. He reports that he does not use drugs.   Family History:  The patient's  family history includes Cancer in his father and mother; Lupus in his mother; Stroke in his sister; Thyroid disease in his sister.    ROS:  Please see the history of present illness.   All other systems are  reviewed and negative.    PHYSICAL EXAM: VS:  BP 124/74   Pulse 92   Ht 5\' 11"  (1.803 m)   Wt 275 lb (124.7 kg)   BMI 38.35 kg/m  , BMI Body mass index is 38.35 kg/m. GEN: overweight, in no acute distress  HEENT: normal  Neck: no JVD, carotid bruits, or masses Cardiac: RRR; no murmurs, rubs, or gallops,R leg edema (chronic) Respiratory:  clear to auscultation bilaterally, normal work of breathing GI: soft, nontender, nondistended, + BS MS: no deformity or atrophy  Skin: warm and dry  Neuro:  Strength and sensation are intact Psych: euthymic mood, full affect  EKG:  EKG is ordered today. The ekg ordered today shows sinus rhythm 83 bpm, PR 242, QRS 174 (LBBB), QTc491   Recent Labs: No results found for requested labs within last 8760 hours.    Lipid Panel     Component Value Date/Time   CHOL 88 (L) 05/04/2016 1001   TRIG 48 05/04/2016 1001   HDL 47 05/04/2016 1001   CHOLHDL 1.9 05/04/2016 1001   VLDL 10 05/04/2016 1001   LDLCALC 31 05/04/2016 1001     Wt Readings from Last 3 Encounters:  08/10/18 275 lb (124.7 kg)  07/20/17 269 lb (122 kg)  07/14/17 276 lb (125.2 kg)    TTE: 07/01/2015 Study  Conclusions - Left ventricle: The cavity size was normal. Wall thickness was increased in a pattern of mild LVH. Systolic function was severely reduced. The estimated ejection fraction was in the range of 20% to 25%. There is akinesis of the anteroseptal and inferoseptal myocardium. - Mitral valve: Calcified annulus. There was mild regurgitation.  Impressions: - Since last echocardiogram, EF has mildly improved.   TTE: 10/2015 - Left ventricle: The cavity size was moderately dilated. There was  mild concentric hypertrophy. Systolic function was moderately  reduced. The estimated ejection fraction was in the range of 35%  to 40%. Hypokinesis of the basal-midinferolateral myocardium. -Impressions: - 2D study only. Compared to the previous study, LVEF has improved.  TTE: 02/2017 - Left ventricle: Diffuse hypokinesis with abnormal septal motion.   The cavity size was moderately dilated. Wall thickness was   increased in a pattern of moderate LVH. Systolic function was   moderately reduced. The estimated ejection fraction was in the   range of 35% to 40%. Doppler parameters are consistent with both   elevated ventricular end-diastolic filling pressure and elevated   left atrial filling pressure. - Mitral valve: There was mild regurgitation. - Left atrium: The atrium was moderately dilated. - Atrial septum: No defect or patent foramen ovale was identified. - Pulmonary arteries: PA peak pressure: 33 mm Hg (S).  ECG: 08/10/2018- personally reviewed, a sensed V paced rhythm with PVCs, unchanged from prior  ASSESSMENT AND PLAN:  1.  The patient has a nonischemic CM (EF 20-25% on TTE in 9/16), NYHA Class IIa CHF, LBBB, and CAD. - s/p BiV ICD implantation in 10/17 with LVEF improvement to 35-40% in 05/2016, no further improvement on TTE in 5/18. - Appears euvolemic.  Motivated to lose 50 pounds, restarted to exercise again status post successful lymphedema treatment -New current  dose of Coreg and losartan as well as Lasix and spironolactone, we will check labs today and adjust if needed.  2.  Chronic systolic dysfunction euvolemic today Stable on an appropriate medical regimen Normal ICD function  3. PVCs/VF Well controlled, no palpitations If he has future arrhythmias, would start sotalol, Could also consider ablation -as  per Dr. Jackalyn Lombard note  4. Obesity Motivated to lose 50 pounds, we set a goal of 20 pounds by the next visit in 6 months.  5. CAD -   LHC showed 90% stenosis of LCx with PCI and stenting in 03/2015, of Plavix secondary to significant bruising, he is asymptomatic.    6. LBBB - s/p BiV ICD implantation   7 Hyperlipidemia - on atorvastatin, well-tolerated  Follow up in 6 months, obtain labs today.  Signed, Ena Dawley, MD  08/10/2018 9:14 AM

## 2018-08-12 ENCOUNTER — Other Ambulatory Visit: Payer: Self-pay | Admitting: Cardiology

## 2018-08-12 DIAGNOSIS — R059 Cough, unspecified: Secondary | ICD-10-CM

## 2018-08-12 DIAGNOSIS — I5022 Chronic systolic (congestive) heart failure: Secondary | ICD-10-CM

## 2018-08-12 DIAGNOSIS — E785 Hyperlipidemia, unspecified: Secondary | ICD-10-CM

## 2018-08-12 DIAGNOSIS — I447 Left bundle-branch block, unspecified: Secondary | ICD-10-CM

## 2018-08-12 DIAGNOSIS — I519 Heart disease, unspecified: Secondary | ICD-10-CM

## 2018-08-12 DIAGNOSIS — R05 Cough: Secondary | ICD-10-CM

## 2018-08-12 DIAGNOSIS — I2510 Atherosclerotic heart disease of native coronary artery without angina pectoris: Secondary | ICD-10-CM

## 2018-08-12 DIAGNOSIS — I428 Other cardiomyopathies: Secondary | ICD-10-CM

## 2018-08-14 ENCOUNTER — Ambulatory Visit: Payer: Medicare HMO

## 2018-08-14 DIAGNOSIS — M25551 Pain in right hip: Secondary | ICD-10-CM

## 2018-08-14 DIAGNOSIS — G8929 Other chronic pain: Secondary | ICD-10-CM | POA: Diagnosis not present

## 2018-08-14 DIAGNOSIS — M25552 Pain in left hip: Secondary | ICD-10-CM

## 2018-08-14 DIAGNOSIS — Z483 Aftercare following surgery for neoplasm: Secondary | ICD-10-CM | POA: Diagnosis not present

## 2018-08-14 DIAGNOSIS — M545 Low back pain: Secondary | ICD-10-CM

## 2018-08-14 DIAGNOSIS — I890 Lymphedema, not elsewhere classified: Secondary | ICD-10-CM | POA: Diagnosis not present

## 2018-08-14 NOTE — Therapy (Signed)
[place], [place], [address] Phone: [phone]   Fax:  [phone]  Physical Therapy Treatment  Patient Details  Name: [name] MRN: [id number] Date of Birth: 06/26/1941 Referring Provider (PT): Dr. Mayer Camel for hip pain   Encounter Date: 08/14/2018  PT End of Session - 08/14/18 1003    Visit Number  16    Number of Visits  24    Date for PT Re-Evaluation  09/10/18    PT Start Time  0928    PT Stop Time  1014    PT Time Calculation (min)  46 min    Activity Tolerance  Patient tolerated treatment well    Behavior During Therapy  [facility] for tasks assessed/performed       Past Medical History:  Diagnosis Date  . Acid reflux   . CAD (coronary artery disease)    PCI to Circ with DES on 04/06/15  . CHF (congestive heart failure) ([place])   . Degenerative disc disease, thoracic   . Hyperlipidemia   . Hyperplastic colon polyp   . LBBB (left bundle branch block)   . Merkel cell cancer ([place]) 05/2005; 07-2005   s/p gluteal resection, lymph node dissection, chemo/ radiation  . Nonischemic cardiomyopathy ([place])    a. s/p STJ CRTD 07/2015  . Obesity   . Ventricular fibrillation ([place])    a. appropriate ICD therapy 02/2017    Past Surgical History:  Procedure Laterality Date  . CARDIAC CATHETERIZATION N/A 04/06/2015   Procedure: Right/Left Heart Cath and Coronary Angiography;  Surgeon: Larey Dresser, MD;  Location: [place] [facility];  Service: Cardiovascular;  Laterality: N/A;  . CARDIAC CATHETERIZATION N/A 04/06/2015   Procedure: Coronary Stent Intervention;  Surgeon: Peter M Martinique, MD;  Location: [place] [facility];  Service: Cardiovascular;  Laterality: N/A;  . CATARACT EXTRACTION W/ INTRAOCULAR LENS  IMPLANT, BILATERAL Bilateral 11/2014  . COLONOSCOPY  2009  . EP IMPLANTABLE DEVICE N/A 08/04/2015   STJ CRTD implanted by Dr Rayann Heman for primary prevention/CHF  . KNEE CARTILAGE SURGERY Left 1960  .  SKIN CANCER EXCISION  05/2005; 07/2005   excision of merkel cell  . TONSILLECTOMY      There were no vitals filed for this visit.  Subjective Assessment - 08/14/18 0932    Subjective  Just still sore in my low back and hips when I walk. I need to get back to the [facility] more consistently, I know that will help too. And I've been trying to do the pelvic tilts but I know I need to review them again so you can check my technique. I saw my cardiologist Friday and everything is looking good, my heart seems to be doing well as well as the pacemaker.     Pertinent History  Merkel cell tumor removed Lt glute with lymph node invlovement Rt groin and abdomen around 2006.  Pt not remembering if any nodes were positive.  12 weeks of chemotherapy and 5or 6 weeks of radiation , Bil bad hips, pacemaker    Patient Stated Goals  get better garments, treat swelling again, improve the hip pain with walking     Currently in Pain?  No/denies                       [facility] Adult PT Treatment/Exercise - 08/14/18 0001      Lumbar Exercises: Stretches   Prone on Elbows Stretch  2 reps;60 seconds  Prone on Elbows Stretch Limitations  VCs for a few deep breaths during to relax further with each exhale    Passive Hamstring Stretch  Right;Left;2 reps;30 seconds    Passive Hamstring Stretch Limitations  with towel    Piriformis Stretch  Right;Left;2 reps;30 seconds   in supine     Lumbar Exercises: Supine   Pelvic Tilt  10 reps   Demonstration and VCs to remind of technique   Clam  10 reps   better pelvic control of tilt today   Bent Knee Raise  10 reps   still challenging but with better control     Lumbar Exercises: Prone   Straight Leg Raise  10 reps   Rt and Lt   Straight Leg Raises Limitations  Pt returned very good demo      Knee/Hip Exercises: Aerobic   Nustep  Level 7, x10 minutes with PTA present to monitor pt and discuss progress thus far      Knee/Hip Exercises: Standing   Hip  Flexion  Stengthening;Right;Left;10 reps   On Airex in // bars    Hip Flexion Limitations  VCs and demonstration for slower pace    Forward Lunges  Right;Left;10 reps   In // bars   Forward Lunges Limitations  With pt returning therapist demonstration    Hip Abduction  Stengthening;Right;Left;10 reps   On Airex in // bars   Abduction Limitations  Demonstration for correct technique and to decrease hip er    Hip Extension  Stengthening;Right;Left;10 reps   On Airex in // bars   Extension Limitations  Demonstration and VCs for correct posture and to decrease forward lean throughout                  PT Long Term Goals - 07/30/18 1212      PT LONG TERM GOAL #1   Title  Pt will reduce Rt LE to the following:  20cm proximal to patella: 63, 10cm from patella: 54, 20cm proximal from lateral malleolus: 39    Status  Partially Met      PT LONG TERM GOAL #2   Title  Pt will obtain appropriate compression garments for continued containment     Status  Achieved      PT LONG TERM GOAL #3   Title  Pt will be knowledgeable about self MLD or use of the pump to enhance compression effects     Status  Achieved      PT LONG TERM GOAL #4   Title  pt will be ind with exercises for hip strengthening and mobility     Time  6    Period  Weeks    Status  New    Target Date  09/10/18      PT LONG TERM GOAL #5   Title  pt will be able to improve gait tolerance to no pain while walking x 6 minutes     Time  6    Period  Weeks    Status  New    Target Date  09/10/18      Additional Long Term Goals   Additional Long Term Goals  Yes      PT LONG TERM GOAL #6   Title  Pt will perform standing lumbar extension to at least neutral without having to bend the knees     Time  6    Period  Weeks    Status  New    Target Date  09/10/18            Plan - 08/14/18 1005    Clinical Impression Statement  Continued with NuStep as Mr. Ambers tolerated this well las time and continued to do  so today. Added balance with hip strength in // bars on Airex today and he reported feeling very challenged by this. Reviewed technique of pelvic tilts and after cuing pt was able to return with good technique and better awareness of pelvic mobility. His posture continues to show improvement after each session and pt able to ambulate with decreased antalgic gait.     Rehab Potential  Good    Clinical Impairments Affecting Rehab Potential  compliance    PT Frequency  2x / week    PT Duration  6 weeks    PT Treatment/Interventions  ADLs/Self Care Home Management;DME Instruction;Therapeutic exercise;Prosthetic Training;Manual lymph drainage;Compression bandaging    PT Next Visit Plan  continue gait, hip strength and flexibility, lumbar/core strength and mobilization; review pelvic tilts/lumabr stabs and progress as able    Consulted and Agree with Plan of Care  Patient       Patient will benefit from skilled therapeutic intervention in order to improve the following deficits and impairments:  Increased edema, Decreased knowledge of use of DME  Visit Diagnosis: Pain in left hip  Pain in right hip  Chronic bilateral low back pain without sciatica     Problem List Patient Active Problem List   Diagnosis Date Noted  . 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 Ventricular tachycardia ([place]) 03/17/2017  . Biventricular ICD (implantable cardioverter-defibrillator) in place 12/14/2016  . Merkel cell carcinoma ([place]) 05/04/2016  . Elevated MCV 08/10/2015  . Cough 05/29/2015  . Hyperlipidemia 05/29/2015  . OSA (obstructive sleep apnea) 05/27/2015  . CAD (coronary artery disease), native coronary artery 04/22/2015  . Chronic systolic CHF (congestive heart failure) ([place]) 04/06/2015  . Nonischemic cardiomyopathy ([place]) 03/16/2015  . Chronic systolic dysfunction of left ventricle 03/16/2015  . LBBB (left bundle branch block) 03/16/2015  . Morbid obesity ([place])  03/16/2015    Otelia Limes, PTA 08/14/2018, 10:18 AM  [place], [place], [address] Phone: [phone]   Fax:  [phone]  Name: [name] MRN: [id number] Date of Birth: 1940-10-26

## 2018-08-15 NOTE — Progress Notes (Signed)
Electrophysiology Office Note Date: 08/24/2018  ID:  Marcus, Christensen 03-11-1941, MRN [id number]  PCP: Orpah Melter, MD Primary Cardiologist: Meda Coffee Electrophysiologist: Allred  CC: Routine ICD follow-up  [name] is a 77 y.o. male seen today for Dr Rayann Heman.  He presents today for routine electrophysiology followup.  Since last being seen in our clinic, the patient reports doing relatively well.  He has been working with PT at [facility] for his back. He is enrolled in the [facility] program starting in February.  He denies chest pain, palpitations, dyspnea, PND, orthopnea, nausea, vomiting, dizziness, syncope, edema, weight gain, or early satiety.  He has not had ICD shocks.   Device History: STJ CRTD implanted 2016 for ICM, CHF, LBBB History of appropriate therapy: yes - 02/2017 History of AAD therapy: No  Past Medical History:  Diagnosis Date  . Acid reflux   . CAD (coronary artery disease)    PCI to Circ with DES on 04/06/15  . CHF (congestive heart failure) ([place])   . Degenerative disc disease, thoracic   . Hyperlipidemia   . Hyperplastic colon polyp   . LBBB (left bundle branch block)   . 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 Nonischemic cardiomyopathy ([place])    a. s/p STJ CRTD 07/2015  . Obesity   . Ventricular fibrillation ([place])    a. appropriate ICD therapy 02/2017   Past Surgical History:  Procedure Laterality Date  . CARDIAC CATHETERIZATION N/A 04/06/2015   Procedure: Right/Left Heart Cath and Coronary Angiography;  Surgeon: Larey Dresser, MD;  Location: [place] [facility];  Service: Cardiovascular;  Laterality: N/A;  . CARDIAC CATHETERIZATION N/A 04/06/2015   Procedure: Coronary Stent Intervention;  Surgeon: Peter M Martinique, MD;  Location: [place] [facility];  Service: Cardiovascular;  Laterality: N/A;  . CATARACT EXTRACTION W/ INTRAOCULAR LENS  IMPLANT, BILATERAL Bilateral 11/2014  .  COLONOSCOPY  2009  . EP IMPLANTABLE DEVICE N/A 08/04/2015   STJ CRTD implanted by Dr Rayann Heman for primary prevention/CHF  . KNEE CARTILAGE SURGERY Left 1960  . SKIN CANCER EXCISION  05/2005; 07/2005   excision of merkel cell  . TONSILLECTOMY      Current Outpatient Medications  Medication Sig Dispense Refill  . acetaminophen (TYLENOL) 325 MG tablet Take 650 mg by mouth every 6 (six) hours as needed for mild pain or moderate pain.    Marland Kitchen aspirin 81 MG tablet Take 81 mg by mouth daily.    Marland Kitchen atorvastatin (LIPITOR) 80 MG tablet Take 1 tablet (80 mg total) by mouth daily. Please keep upcoming appt in October with Dr. Meda Coffee for future refills. Thank you 90 tablet 0  . carvedilol (COREG) 12.5 MG tablet Take 1 tablet (12.5 mg total) by mouth 2 (two) times daily with a meal. 180 tablet 3  . fluorouracil (EFUDEX) 5 % cream Apply 5 application topically daily as needed (Patches on face).   0  . furosemide (LASIX) 20 MG tablet Take 1 tablet (20 mg total) by mouth 2 (two) times daily. Please keep upcoming appt in October with Dr. Meda Coffee for future refills. Thank you 180 tablet 0  . ibuprofen (ADVIL,MOTRIN) 200 MG tablet Take 200 mg by mouth 2 (two) times daily as needed (pain).    Marland Kitchen levothyroxine (SYNTHROID, LEVOTHROID) 50 MCG tablet Take 50 mcg by mouth daily.  5  . losartan (COZAAR) 25 MG tablet Take 1 tablet (25 mg total) by  mouth daily. Please keep upcoming appt in October with Dr. Meda Coffee for future refills. Thank you 90 tablet 0  . nitroGLYCERIN (NITROSTAT) 0.4 MG SL tablet Place 1 tablet (0.4 mg total) under the tongue every 5 (five) minutes as needed for chest pain. 25 tablet 6  . POTASSIUM PO Take 550 mg by mouth daily as needed (Potassium supplement).     Marland Kitchen spironolactone (ALDACTONE) 25 MG tablet Take 0.5 tablets (12.5 mg total) by mouth daily. 15 tablet 11   No current facility-administered medications for this visit.    Facility-Administered Medications Ordered in Other Visits  Medication Dose  Route Frequency Provider Last Rate Last Dose  . adenosine (diagnostic) (ADENOSCAN) infusion 68.4 mg  0.56 mg/kg Intravenous Once Lelon Perla, MD        Allergies:   Tape   Social History: Social History   Socioeconomic History  . Marital status: Married    Spouse name: [name]  . Number of children: 2  . Years of education: college  . Highest education level: Not on file  Occupational History  . Occupation: [employer]er, chartered  Social Needs  . Financial resource strain: Not on file  . Food insecurity:    Worry: Not on file    Inability: Not on file  . Transportation needs:    Medical: Not on file    Non-medical: Not on file  Tobacco Use  . Smoking status: Former Smoker    Packs/day: 0.25    Years: 15.00    Pack years: 3.75    Types: Cigarettes    Last attempt to quit: 10/25/1983    Years since quitting: 34.8  . Smokeless tobacco: Never Used  Substance and Sexual Activity  . Alcohol use: Yes    Alcohol/week: 0.0 standard drinks    Comment: 08/04/2015 "I doubt if I average a glass of wine and 1 beer q week"  . Drug use: No  . Sexual activity: Not Currently  Lifestyle  . Physical activity:    Days per week: Not on file    Minutes per session: Not on file  . Stress: Not on file  Relationships  . Social connections:    Talks on phone: Not on file    Gets together: Not on file    Attends religious service: Not on file    Active member of club or organization: Not on file    Attends meetings of clubs or organizations: Not on file    Relationship status: Not on file  . Intimate partner violence:    Fear of current or ex partner: Not on file    Emotionally abused: Not on file    Physically abused: Not on file    Forced sexual activity: Not on file  Other Topics Concern  . Not on file  Social History Narrative   Pt lives in [place], married x 45 years.  2 grown sons have HTN.   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 Cancer Mother        breast  . Lupus Mother   . Cancer Father        prostate  . Stroke Sister   . Thyroid disease Sister   . Heart attack Neg Hx   . Hypertension Neg Hx     Review of Systems: All other systems reviewed and are otherwise negative except as noted above.   Physical Exam: VS:  BP 118/78   Pulse 85   Ht 5\' 11"  (1.803 m)   Wt 276 lb 12.8 oz (125.6 kg)   SpO2 94%   BMI 38.61 kg/m  , BMI Body mass index is 38.61 kg/m.  GEN- The patient is well appearing, alert and oriented x 3 today.   HEENT: normocephalic, atraumatic; sclera clear, conjunctiva pink; hearing intact; oropharynx clear; neck supple  Lungs- Clear to ausculation bilaterally, normal work of breathing.  No wheezes, rales, rhonchi Heart- Regular rate and rhythm (paced) GI- soft, non-tender, non-distended, bowel sounds present  Extremities- no clubbing, cyanosis, +lymphedema MS- no significant deformity or atrophy Skin- warm and dry, no rash or lesion; ICD pocket well healed Psych- euthymic mood, full affect Neuro- strength and sensation are intact  ICD interrogation- reviewed in detail today,  See PACEART report  EKG:  EKG is not ordered today.  Recent Labs: 08/10/2018: ALT 19; BUN 17; Creatinine, Ser 1.16; Hemoglobin 13.8; Platelets 137; Potassium 4.0; Sodium 143; TSH 2.910   Wt Readings from Last 3 Encounters:  08/24/18 276 lb 12.8 oz (125.6 kg)  08/10/18 275 lb (124.7 kg)  07/20/17 269 lb (122 kg)     Other studies Reviewed: Additional studies/ records that were reviewed today include: Dr Rayann Heman and Dr Francesca Oman notes   Assessment and Plan:  1.  Chronic systolic dysfunction euvolemic today Stable on an appropriate medical regimen Normal ICD function See Pace Art report No changes today He did not tolerate higher doses of Coreg in the past  2.  VF/PVC's No recent recurrence of ventricular arrhythmias 5.1% PVC burden by  device counter, 88% CRT pacing (I think PVC burden is likely higher) Keep K>3.9, Mg >1.8 Per Dr Jackalyn Lombard note, if recurrence, can consider Sotalol vs ablation  3.  CAD No recent ischemic symptoms Continue medical therapy  4.  Obesity Body mass index is 38.61 kg/m. Weight loss advised    Current medicines are reviewed at length with the patient today.   The patient does not have concerns regarding his medicines.  The following changes were made today:  none  Labs/ tests ordered today include: none Orders Placed This Encounter  Procedures  . CUP PACEART INCLINIC DEVICE CHECK     Disposition:   Follow up with Delilah Shan, Dr Allred 1 year, Dr Meda Coffee as scheduled    Signed, Chanetta Marshall, NP 08/24/2018 10:32 AM  [place] [address] [phone] (office) [phone] (fax)

## 2018-08-16 ENCOUNTER — Other Ambulatory Visit: Payer: Self-pay

## 2018-08-16 ENCOUNTER — Ambulatory Visit: Payer: Medicare HMO | Admitting: Physical Therapy

## 2018-08-16 ENCOUNTER — Encounter: Payer: Self-pay | Admitting: Physical Therapy

## 2018-08-16 DIAGNOSIS — M545 Low back pain, unspecified: Secondary | ICD-10-CM

## 2018-08-16 DIAGNOSIS — I890 Lymphedema, not elsewhere classified: Secondary | ICD-10-CM | POA: Diagnosis not present

## 2018-08-16 DIAGNOSIS — M25552 Pain in left hip: Secondary | ICD-10-CM | POA: Diagnosis not present

## 2018-08-16 DIAGNOSIS — M25551 Pain in right hip: Secondary | ICD-10-CM

## 2018-08-16 DIAGNOSIS — Z483 Aftercare following surgery for neoplasm: Secondary | ICD-10-CM | POA: Diagnosis not present

## 2018-08-16 DIAGNOSIS — G8929 Other chronic pain: Secondary | ICD-10-CM

## 2018-08-16 NOTE — Therapy (Signed)
[place] [facility], [place], [address] Phone: [phone]   Fax:  [phone]  Physical Therapy Treatment  Patient Details  Name: [name] MRN: [id number] Date of Birth: 1941-08-22 Referring Provider (PT): Dr. Mayer Camel for hip pain   Encounter Date: 08/16/2018  PT End of Session - 08/16/18 1616    Visit Number  17    Number of Visits  24    Date for PT Re-Evaluation  09/10/18    PT Start Time  1306    PT Stop Time  1350    PT Time Calculation (min)  44 min    Activity Tolerance  Patient tolerated treatment well    Behavior During Therapy  [facility] for tasks assessed/performed       Past Medical History:  Diagnosis Date  . Acid reflux   . CAD (coronary artery disease)    PCI to Circ with DES on 04/06/15  . CHF (congestive heart failure) ([place])   . Degenerative disc disease, thoracic   . Hyperlipidemia   . Hyperplastic colon polyp   . LBBB (left bundle branch block)   . 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 Nonischemic cardiomyopathy ([place])    a. s/p STJ CRTD 07/2015  . Obesity   . Ventricular fibrillation ([place])    a. appropriate ICD therapy 02/2017    Past Surgical History:  Procedure Laterality Date  . CARDIAC CATHETERIZATION N/A 04/06/2015   Procedure: Right/Left Heart Cath and Coronary Angiography;  Surgeon: Larey Dresser, MD;  Location: [place] [facility];  Service: Cardiovascular;  Laterality: N/A;  . CARDIAC CATHETERIZATION N/A 04/06/2015   Procedure: Coronary Stent Intervention;  Surgeon: Peter M Martinique, MD;  Location: [place] [facility];  Service: Cardiovascular;  Laterality: N/A;  . CATARACT EXTRACTION W/ INTRAOCULAR LENS  IMPLANT, BILATERAL Bilateral 11/2014  . COLONOSCOPY  2009  . EP IMPLANTABLE DEVICE N/A 08/04/2015   STJ CRTD implanted by Dr Rayann Heman for primary prevention/CHF  . KNEE CARTILAGE SURGERY Left 1960  .  SKIN CANCER EXCISION  05/2005; 07/2005   excision of merkel cell  . TONSILLECTOMY      There were no vitals filed for this visit.  Subjective Assessment - 08/16/18 1311    Subjective  I was a little sore after last time. I went home and took a couple Advil and it went away.     Pertinent History  Merkel cell tumor removed Lt glute with lymph node invlovement Rt groin and abdomen around 2006.  Pt not remembering if any nodes were positive.  12 weeks of chemotherapy and 5or 6 weeks of radiation , Bil bad hips, pacemaker    Patient Stated Goals  get better garments, treat swelling again, improve the hip pain with walking     Currently in Pain?  No/denies    Pain Score  0-No pain                       [facility] Adult PT Treatment/Exercise - 08/16/18 0001      Lumbar Exercises: Stretches   Prone on Elbows Stretch  2 reps;60 seconds    Prone on Elbows Stretch Limitations  VCs for a few deep breaths during to relax further with each exhale      Lumbar Exercises: Supine   Pelvic Tilt  2 reps;10 reps   Demonstration and VCs to remind of technique  Clam  10 reps   better pelvic control of tilt today   Bent Knee Raise  10 reps   still challenging but with better control     Lumbar Exercises: Prone   Straight Leg Raise  10 reps   Rt and Lt   Straight Leg Raises Limitations  cues not to tilt pelvis      Knee/Hip Exercises: Stretches   Passive Hamstring Stretch  Right;Left;2 reps;30 seconds    Passive Hamstring Stretch Limitations  with towel    Piriformis Stretch  Right;Left;2 reps;30 seconds   in supine     Knee/Hip Exercises: Aerobic   Nustep  Level 7, x10 minutes with PTA present to monitor pt and discuss progress thus far      Knee/Hip Exercises: Standing   Hip Flexion  Stengthening;Right;Left;10 reps   On Airex in // bars    Hip Flexion Limitations  VCs and demonstration     Hip Abduction  Stengthening;Right;Left;10 reps   On Airex in // bars   Abduction  Limitations  Demonstration for correct technique and to decrease hip er    Hip Extension  Stengthening;Right;Left;10 reps   On Airex in // bars   Extension Limitations  Demonstration and VCs for correct posture and to decrease forward lean throughout                  PT Long Term Goals - 07/30/18 1212      PT LONG TERM GOAL #1   Title  Pt will reduce Rt LE to the following:  20cm proximal to patella: 63, 10cm from patella: 54, 20cm proximal from lateral malleolus: 39    Status  Partially Met      PT LONG TERM GOAL #2   Title  Pt will obtain appropriate compression garments for continued containment     Status  Achieved      PT LONG TERM GOAL #3   Title  Pt will be knowledgeable about self MLD or use of the pump to enhance compression effects     Status  Achieved      PT LONG TERM GOAL #4   Title  pt will be ind with exercises for hip strengthening and mobility     Time  6    Period  Weeks    Status  New    Target Date  09/10/18      PT LONG TERM GOAL #5   Title  pt will be able to improve gait tolerance to no pain while walking x 6 minutes     Time  6    Period  Weeks    Status  New    Target Date  09/10/18      Additional Long Term Goals   Additional Long Term Goals  Yes      PT LONG TERM GOAL #6   Title  Pt will perform standing lumbar extension to at least neutral without having to bend the knees     Time  6    Period  Weeks    Status  New    Target Date  09/10/18            Plan - 08/16/18 1617    Clinical Impression Statement  Pt continued with hip and LE strengthening today. He is still requiring frequent cueing to perform exercises correctly and not to compensate especially when doing hip exerciess on Airex. Pt required continued verbal cueing to perform pelvic tilt correctly and this  was still very challenging for pt. He is very motivated to participate in therapy.    Rehab Potential  Good    Clinical Impairments Affecting Rehab Potential   compliance    PT Frequency  2x / week    PT Duration  6 weeks    PT Treatment/Interventions  ADLs/Self Care Home Management;DME Instruction;Therapeutic exercise;Prosthetic Training;Manual lymph drainage;Compression bandaging    PT Next Visit Plan  continue gait, hip strength and flexibility, lumbar/core strength and mobilization; review pelvic tilts/lumabr stabs and progress as able    PT Home Exercise Plan  Access Code: [email or web address] hip stretches and added pelvic tilt    Consulted and Agree with Plan of Care  Patient       Patient will benefit from skilled therapeutic intervention in order to improve the following deficits and impairments:  Increased edema, Decreased knowledge of use of DME  Visit Diagnosis: Pain in left hip  Pain in right hip  Chronic bilateral low back pain without sciatica     Problem List Patient Active Problem List   Diagnosis Date Noted  . 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 Ventricular tachycardia ([place]) 03/17/2017  . Biventricular ICD (implantable cardioverter-defibrillator) in place 12/14/2016  . Merkel cell carcinoma ([place]) 05/04/2016  . Elevated MCV 08/10/2015  . Cough 05/29/2015  . Hyperlipidemia 05/29/2015  . OSA (obstructive sleep apnea) 05/27/2015  . CAD (coronary artery disease), native coronary artery 04/22/2015  . Chronic systolic CHF (congestive heart failure) ([place]) 04/06/2015  . Nonischemic cardiomyopathy ([place]) 03/16/2015  . Chronic systolic dysfunction of left ventricle 03/16/2015  . LBBB (left bundle branch block) 03/16/2015  . Morbid obesity ([place]) 03/16/2015    Allyson Sabal [facility] 08/16/2018, 4:20 PM  [place], [place], [address] Phone: [phone]   Fax:  [phone]  Name: [name] MRN: [id number] Date of Birth: June 25, 1941  Manus Gunning, PT 08/16/18 4:20 PM

## 2018-08-20 ENCOUNTER — Ambulatory Visit (INDEPENDENT_AMBULATORY_CARE_PROVIDER_SITE_OTHER): Payer: Medicare HMO | Admitting: *Deleted

## 2018-08-20 ENCOUNTER — Other Ambulatory Visit: Payer: Self-pay | Admitting: Cardiology

## 2018-08-20 DIAGNOSIS — I890 Lymphedema, not elsewhere classified: Secondary | ICD-10-CM | POA: Diagnosis not present

## 2018-08-20 DIAGNOSIS — I428 Other cardiomyopathies: Secondary | ICD-10-CM

## 2018-08-20 DIAGNOSIS — I5022 Chronic systolic (congestive) heart failure: Secondary | ICD-10-CM

## 2018-08-20 NOTE — Progress Notes (Signed)
Remote ICD transmission.   

## 2018-08-21 ENCOUNTER — Ambulatory Visit: Payer: Medicare HMO

## 2018-08-21 DIAGNOSIS — M25551 Pain in right hip: Secondary | ICD-10-CM | POA: Diagnosis not present

## 2018-08-21 DIAGNOSIS — G8929 Other chronic pain: Secondary | ICD-10-CM | POA: Diagnosis not present

## 2018-08-21 DIAGNOSIS — Z483 Aftercare following surgery for neoplasm: Secondary | ICD-10-CM | POA: Diagnosis not present

## 2018-08-21 DIAGNOSIS — M545 Low back pain: Secondary | ICD-10-CM

## 2018-08-21 DIAGNOSIS — M25552 Pain in left hip: Secondary | ICD-10-CM

## 2018-08-21 DIAGNOSIS — I890 Lymphedema, not elsewhere classified: Secondary | ICD-10-CM | POA: Diagnosis not present

## 2018-08-21 NOTE — Therapy (Signed)
[place], [place], [address] Phone: [phone]   Fax:  [phone]  Physical Therapy Treatment  Patient Details  Name: [name] MRN: [id number] Date of Birth: 1940-11-30 Referring Provider (PT): Dr. Mayer Camel for hip pain   Encounter Date: 08/21/2018  PT End of Session - 08/21/18 1021    Visit Number  18    Number of Visits  24    Date for PT Re-Evaluation  09/10/18    PT Start Time  0937    PT Stop Time  1020    PT Time Calculation (min)  43 min    Activity Tolerance  Patient tolerated treatment well    Behavior During Therapy  [facility] for tasks assessed/performed       Past Medical History:  Diagnosis Date  . Acid reflux   . CAD (coronary artery disease)    PCI to Circ with DES on 04/06/15  . CHF (congestive heart failure) ([place])   . Degenerative disc disease, thoracic   . Hyperlipidemia   . Hyperplastic colon polyp   . LBBB (left bundle branch block)   . 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 Nonischemic cardiomyopathy ([place])    a. s/p STJ CRTD 07/2015  . Obesity   . Ventricular fibrillation ([place])    a. appropriate ICD therapy 02/2017    Past Surgical History:  Procedure Laterality Date  . CARDIAC CATHETERIZATION N/A 04/06/2015   Procedure: Right/Left Heart Cath and Coronary Angiography;  Surgeon: Marcus Dresser, MD;  Location: [place] [facility];  Service: Cardiovascular;  Laterality: N/A;  . CARDIAC CATHETERIZATION N/A 04/06/2015   Procedure: Coronary Stent Intervention;  Surgeon: Marcus M Martinique, MD;  Location: [place] [facility];  Service: Cardiovascular;  Laterality: N/A;  . CATARACT EXTRACTION W/ INTRAOCULAR LENS  IMPLANT, BILATERAL Bilateral 11/2014  . COLONOSCOPY  2009  . EP IMPLANTABLE DEVICE N/A 08/04/2015   STJ CRTD implanted by Dr [name] for primary prevention/CHF  . KNEE CARTILAGE SURGERY Left 1960  .  SKIN CANCER EXCISION  05/2005; 07/2005   excision of merkel cell  . TONSILLECTOMY      There were no vitals filed for this visit.  Subjective Assessment - 08/21/18 0943    Subjective  I went fly fishing with my son over the weekend. I sat in the boat for hours and had a good time and was able to do that. I was very stiff after that and 2 days after but feel fine now. I was glad to be able to do that.     Pertinent History  Merkel cell tumor removed Lt glute with lymph node invlovement Rt groin and abdomen around 2006.  Pt not remembering if any nodes were positive.  12 weeks of chemotherapy and 5or 6 weeks of radiation , Bil bad hips, pacemaker    Patient Stated Goals  get better garments, treat swelling again, improve the hip pain with walking     Currently in Pain?  No/denies                       [facility] Adult PT Treatment/Exercise - 08/21/18 0001      Lumbar Exercises: Stretches   Standing Extension  10 reps    Standing Extension Limitations  VCs for lumbar extension, not to just lean back     Passive Hamstring Stretch  Right;Left;2 reps;30 seconds  with deep breathing at end of stretch for increased ROM   Piriformis Stretch  Right;Left;2 reps;30 seconds   in supine     Lumbar Exercises: Supine   Pelvic Tilt  2 reps;10 reps   VCs continued to be required but pt is improving     Knee/Hip Exercises: Aerobic   Nustep  Level 7, x10 minutes with PTA present to monitor pt and discuss progress thus far      Knee/Hip Exercises: Machines for Strengthening   Cybex Leg Press  60#, 2x20 with VCs for correct technique (push thru heels, not toes, and for slow, controlled motions)    Hip Cybex  25# for both exs: x25 for bil hip extension, (Rt LE needed rest break during due to fatigue) then bil hip abduction x15 each side with multiple tactile and VCs throughout to decrease compensations (trunk leans and hip er) pt reported these very challenging but liked learning them so he can  replicate at the gym                  PT Long Term Goals - 07/30/18 1212      PT LONG TERM GOAL #1   Title  Pt will reduce Rt LE to the following:  20cm proximal to patella: 63, 10cm from patella: 54, 20cm proximal from lateral malleolus: 39    Status  Partially Met      PT LONG TERM GOAL #2   Title  Pt will obtain appropriate compression garments for continued containment     Status  Achieved      PT LONG TERM GOAL #3   Title  Pt will be knowledgeable about self MLD or use of the pump to enhance compression effects     Status  Achieved      PT LONG TERM GOAL #4   Title  pt will be ind with exercises for hip strengthening and mobility     Time  6    Period  Weeks    Status  New    Target Date  09/10/18      PT LONG TERM GOAL #5   Title  pt will be able to improve gait tolerance to no pain while walking x 6 minutes     Time  6    Period  Weeks    Status  New    Target Date  09/10/18      Additional Long Term Goals   Additional Long Term Goals  Yes      PT LONG TERM GOAL #6   Title  Pt will perform standing lumbar extension to at least neutral without having to bend the knees     Time  6    Period  Weeks    Status  New    Target Date  09/10/18            Plan - 08/21/18 1159    Clinical Impression Statement  Continued with focus to bil LE strengthening focsuing on hips. Progressed to include machines for LE/hip strengthening to educate him on what he can use of the [employer] and he reported feeling encouraged by learning other options to include in his exercise program. He was very tight from fly fishing trip (though pt reports this has calmed down alot since the weekend) so ended session with hip flexibility and reviewd his pelvic tilt which he performed very well today.     Rehab Potential  Good    Clinical Impairments Affecting  Rehab Potential  compliance    PT Frequency  2x / week    PT Duration  6 weeks    PT Treatment/Interventions  ADLs/Self Care Home  Management;DME Instruction;Therapeutic exercise;Prosthetic Training;Manual lymph drainage;Compression bandaging    PT Next Visit Plan  Review machines for gym use again and answer any questions pt may have; continue gait, hip strength and flexibility, lumbar/core strength and mobilization; review/cont pelvic tilts/lumabr stabs assessing technique    Consulted and Agree with Plan of Care  Patient       Patient will benefit from skilled therapeutic intervention in order to improve the following deficits and impairments:  Increased edema, Decreased knowledge of use of DME  Visit Diagnosis: Pain in left hip  Pain in right hip  Chronic bilateral low back pain without sciatica     Problem List Patient Active Problem List   Diagnosis Date Noted  . 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 Ventricular tachycardia ([place]) 03/17/2017  . Biventricular ICD (implantable cardioverter-defibrillator) in place 12/14/2016  . Merkel cell carcinoma ([place]) 05/04/2016  . Elevated MCV 08/10/2015  . Cough 05/29/2015  . Hyperlipidemia 05/29/2015  . OSA (obstructive sleep apnea) 05/27/2015  . CAD (coronary artery disease), native coronary artery 04/22/2015  . Chronic systolic CHF (congestive heart failure) ([place]) 04/06/2015  . Nonischemic cardiomyopathy ([place]) 03/16/2015  . Chronic systolic dysfunction of left ventricle 03/16/2015  . LBBB (left bundle branch block) 03/16/2015  . Morbid obesity ([place]) 03/16/2015    Otelia Limes, PTA 08/21/2018, 12:06 PM  [place], [place], [address] Phone: [phone]   Fax:  [phone]  Name: KRISTOF NADEEM MRN: [id number] Date of Birth: 1941-07-22

## 2018-08-23 ENCOUNTER — Ambulatory Visit: Payer: Medicare HMO

## 2018-08-23 DIAGNOSIS — G8929 Other chronic pain: Secondary | ICD-10-CM | POA: Diagnosis not present

## 2018-08-23 DIAGNOSIS — M545 Low back pain: Secondary | ICD-10-CM

## 2018-08-23 DIAGNOSIS — M25552 Pain in left hip: Secondary | ICD-10-CM

## 2018-08-23 DIAGNOSIS — M25551 Pain in right hip: Secondary | ICD-10-CM

## 2018-08-23 DIAGNOSIS — Z483 Aftercare following surgery for neoplasm: Secondary | ICD-10-CM | POA: Diagnosis not present

## 2018-08-23 DIAGNOSIS — I890 Lymphedema, not elsewhere classified: Secondary | ICD-10-CM | POA: Diagnosis not present

## 2018-08-23 NOTE — Therapy (Signed)
[place], [place], [address] Phone: [phone]   Fax:  [phone]  Physical Therapy Treatment  Patient Details  Name: [name] MRN: [id number] Date of Birth: 04-07-1941 Referring Provider (PT): Dr. Mayer Christensen Christensen hip pain   Encounter Date: 08/23/2018  PT End of Session - 08/23/18 1023    Visit Number  19    Number of Visits  24    Date Christensen PT Re-Evaluation  09/10/18    PT Start Time  0938    PT Stop Time  1022    PT Time Calculation (min)  44 min    Activity Tolerance  Patient tolerated treatment well    Behavior During Therapy  [name] Christensen tasks assessed/performed       Past Medical History:  Diagnosis Date  . Acid reflux   . CAD (coronary artery disease)    PCI to Circ with DES on 04/06/15  . CHF (congestive heart failure) ([place])   . Degenerative disc disease, thoracic   . Hyperlipidemia   . Hyperplastic colon polyp   . LBBB (left bundle branch block)   . 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 Nonischemic cardiomyopathy ([place])    a. s/p STJ CRTD 07/2015  . Obesity   . Ventricular fibrillation ([place])    a. appropriate ICD therapy 02/2017    Past Surgical History:  Procedure Laterality Date  . CARDIAC CATHETERIZATION N/A 04/06/2015   Procedure: Right/Left Heart Cath and Coronary Angiography;  Surgeon: Marcus Dresser, MD;  Location: [facility];  Service: Cardiovascular;  Laterality: N/A;  . CARDIAC CATHETERIZATION N/A 04/06/2015   Procedure: Coronary Stent Intervention;  Surgeon: Marcus M Martinique, MD;  Location: [facility];  Service: Cardiovascular;  Laterality: N/A;  . CATARACT EXTRACTION W/ INTRAOCULAR LENS  IMPLANT, BILATERAL Bilateral 11/2014  . COLONOSCOPY  2009  . EP IMPLANTABLE DEVICE N/A 08/04/2015   STJ CRTD implanted by Dr [name] primary prevention/CHF  . KNEE CARTILAGE SURGERY Left 1960  .  SKIN CANCER EXCISION  05/2005; 07/2005   excision of merkel cell  . TONSILLECTOMY      There were no vitals filed Christensen this visit.  Subjective Assessment - 08/23/18 0941    Subjective  I'm feeling really sore today, I tink the weaher isn't helping.    Pertinent History  Merkel cell tumor removed Lt glute with lymph node invlovement Rt groin and abdomen around 2006.  Pt not remembering if any nodes were positive.  12 weeks of chemotherapy and 5or 6 weeks of radiation , Bil bad hips, pacemaker    Patient Stated Goals  get better garments, treat swelling again, improve the hip pain with walking     Currently in Pain?  No/denies                       [facility] Adult PT Treatment/Exercise - 08/23/18 0001      Lumbar Exercises: Stretches   Standing Extension  5 reps    Standing Extension Limitations  Pt did with better technique today      Knee/Hip Exercises: Stretches   Passive Hamstring Stretch  Right;Left;2 reps;30 seconds   with strap and encouraged deep breathing during   Hip Flexor Stretch  Right;Left;2 reps;30 seconds   Supine with leg off bed/foot on floor   Piriformis Stretch  Right;Left;2 reps;30 seconds   seated edge  of bed     Knee/Hip Exercises: Aerobic   Nustep  Level 7, x10 minutes with PTA present to monitor pt and discuss progress thus far      Knee/Hip Exercises: Machines Christensen Strengthening   Cybex Leg Press  60#, 2x20 with VCs to remind pt of correct technique (push thru heels, not toes, and Christensen slow, controlled motions)    Hip Cybex  25# Christensen both exs: x12 Christensen bil hip extension, then bil hip abduction x12 each side with less VCs throughout to decrease compensations, pt tolerated thisbetter today with improved technique             PT Education - 08/23/18 1305    Education Details  [employer] handout    Person(s) Educated  Patient    Methods  Explanation;Handout    Comprehension  Verbalized understanding          PT Long Term Goals - 07/30/18  1212      PT LONG TERM GOAL #1   Title  Pt will reduce Rt LE to the following:  20cm proximal to patella: 63, 10cm from patella: 54, 20cm proximal from lateral malleolus: 39    Status  Partially Met      PT LONG TERM GOAL #2   Title  Pt will obtain appropriate compression garments Christensen continued containment     Status  Achieved      PT LONG TERM GOAL #3   Title  Pt will be knowledgeable about self MLD or use of the pump to enhance compression effects     Status  Achieved      PT LONG TERM GOAL #4   Title  pt will be ind with exercises Christensen hip strengthening and mobility     Time  6    Period  Weeks    Status  New    Target Date  09/10/18      PT LONG TERM GOAL #5   Title  pt will be able to improve gait tolerance to no pain while walking x 6 minutes     Time  6    Period  Weeks    Status  New    Target Date  09/10/18      Additional Long Term Goals   Additional Long Term Goals  Yes      PT LONG TERM GOAL #6   Title  Pt will perform standing lumbar extension to at least neutral without having to bend the knees     Time  6    Period  Weeks    Status  New    Target Date  09/10/18            Plan - 08/23/18 1023    Clinical Impression Statement  Pt was reporting increased soreness at beginning of session today but did report this improved by end of session after exercise and stretching. He was educated to regarding [employer] program at the [facility] and was very happy to learn about this and wants to call to look into it so issued handout Christensen this today. [name] continues to benefit from skilled PT addressing his LE/hip weakness as he compensates with alot of his exercises, and with encouraging him with being consistent with stretching and best times to do this at home. Pt is doing well with compliance thus far of HEP , thouh also still requires VCs Christensen technique with some of these exercises as well.     Rehab Potential  Good    Clinical Impairments Affecting Rehab Potential   compliance    PT Frequency  2x / week    PT Duration  6 weeks    PT Treatment/Interventions  ADLs/Self Care Home Management;DME Instruction;Therapeutic exercise;Prosthetic Training;Manual lymph drainage;Compression bandaging    PT Next Visit Plan  Review machines Christensen gym use again and answer any questions pt may have; continue gait, hip strength and flexibility, lumbar/core strength and mobilization; review/cont pelvic tilts/lumabr stabs assessing technique    Consulted and Agree with Plan of Care  Patient       Patient will benefit from skilled therapeutic intervention in order to improve the following deficits and impairments:  Increased edema, Decreased knowledge of use of DME  Visit Diagnosis: Pain in left hip  Pain in right hip  Chronic bilateral low back pain without sciatica     Problem List Patient Active Problem List   Diagnosis Date Noted  . 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 Ventricular tachycardia ([place]) 03/17/2017  . Biventricular ICD (implantable cardioverter-defibrillator) in place 12/14/2016  . Merkel cell carcinoma ([place]) 05/04/2016  . Elevated MCV 08/10/2015  . Cough 05/29/2015  . Hyperlipidemia 05/29/2015  . OSA (obstructive sleep apnea) 05/27/2015  . CAD (coronary artery disease), native coronary artery 04/22/2015  . Chronic systolic CHF (congestive heart failure) ([place]) 04/06/2015  . Nonischemic cardiomyopathy ([place]) 03/16/2015  . Chronic systolic dysfunction of left ventricle 03/16/2015  . LBBB (left bundle branch block) 03/16/2015  . Morbid obesity ([place]) 03/16/2015    Otelia Limes, PTA 08/23/2018, 1:06 PM  [place], [place], [address] Phone: [phone]   Fax:  [phone]  Name: [name] MRN: [id number] Date of Birth: December 12, 1940

## 2018-08-24 ENCOUNTER — Ambulatory Visit: Payer: Medicare HMO | Admitting: Nurse Practitioner

## 2018-08-24 ENCOUNTER — Encounter: Payer: Self-pay | Admitting: Nurse Practitioner

## 2018-08-24 VITALS — BP 118/78 | HR 85 | Ht 71.0 in | Wt 276.8 lb

## 2018-08-24 DIAGNOSIS — I472 Ventricular tachycardia, unspecified: Secondary | ICD-10-CM

## 2018-08-24 DIAGNOSIS — I428 Other cardiomyopathies: Secondary | ICD-10-CM

## 2018-08-24 DIAGNOSIS — I2510 Atherosclerotic heart disease of native coronary artery without angina pectoris: Secondary | ICD-10-CM | POA: Diagnosis not present

## 2018-08-24 DIAGNOSIS — I5022 Chronic systolic (congestive) heart failure: Secondary | ICD-10-CM | POA: Diagnosis not present

## 2018-08-24 LAB — CUP PACEART INCLINIC DEVICE CHECK
Date Time Interrogation Session: 20191101094500
Implantable Lead Implant Date: 20161011
Implantable Lead Location: 753860
Implantable Lead Location: 753860
Implantable Pulse Generator Implant Date: 20161011
MDC IDC LEAD IMPLANT DT: 20161011
MDC IDC LEAD IMPLANT DT: 20161011
MDC IDC LEAD LOCATION: 753858
MDC IDC PG SERIAL: 7276664

## 2018-08-24 NOTE — Patient Instructions (Signed)
Medication Instructions:  none If you need a refill on your cardiac medications before your next appointment, please call your pharmacy.   Lab work: none If you have labs (blood work) drawn today and your tests are completely normal, you will receive your results only by: Marland Kitchen MyChart Message (if you have MyChart) OR . A paper copy in the mail If you have any lab test that is abnormal or we need to change your treatment, we will call you to review the results.  Testing/Procedures: none  Follow-Up: Remote monitoring is used to monitor your  ICD from home. This monitoring reduces the number of office visits required to check your device to one time per year. It allows [place] to keep an eye on the functioning of your device to ensure it is working properly. You are scheduled for a device check from home on 11/19/18. You may send your transmission at any time that day. If you have a wireless device, the transmission will be sent automatically. After your physician reviews your transmission, you will receive a postcard with your next transmission date.   At [facility], you and your health needs are our priority.  As part of our continuing mission to provide you with exceptional heart care, we have created designated Provider Care Teams.  These Care Teams include your primary Cardiologist (physician) and Advanced Practice Providers (APPs -  Physician Assistants and Nurse Practitioners) who all work together to provide you with the care you need, when you need it. You will need a follow up appointment in 1 years.  Please call our office 2 months in advance to schedule this appointment.  You may see Dr Rayann Heman or one of the following Advanced Practice Providers on your designated Care Team:   Chanetta Marshall, NP . Tommye Standard, PA-C  Any Other Special Instructions Will Be Listed Below (If Applicable).

## 2018-08-28 ENCOUNTER — Ambulatory Visit: Payer: Medicare HMO | Attending: Family Medicine

## 2018-08-28 DIAGNOSIS — M545 Low back pain: Secondary | ICD-10-CM | POA: Diagnosis not present

## 2018-08-28 DIAGNOSIS — C4A9 Merkel cell carcinoma, unspecified: Secondary | ICD-10-CM | POA: Insufficient documentation

## 2018-08-28 DIAGNOSIS — M25551 Pain in right hip: Secondary | ICD-10-CM | POA: Diagnosis not present

## 2018-08-28 DIAGNOSIS — Z483 Aftercare following surgery for neoplasm: Secondary | ICD-10-CM | POA: Insufficient documentation

## 2018-08-28 DIAGNOSIS — I890 Lymphedema, not elsewhere classified: Secondary | ICD-10-CM | POA: Diagnosis not present

## 2018-08-28 DIAGNOSIS — M25552 Pain in left hip: Secondary | ICD-10-CM | POA: Diagnosis not present

## 2018-08-28 DIAGNOSIS — G8929 Other chronic pain: Secondary | ICD-10-CM | POA: Diagnosis not present

## 2018-08-28 NOTE — Therapy (Signed)
Progress Note Reporting Period 07/13/18 to 08/28/18  See note below for Objective Data and Assessment of Progress/Goals.        [place], [place], [address] Phone: [phone]   Fax:  [phone]  Physical Therapy Treatment  Patient Details  Name: [name] MRN: [id number] Date of Birth: [birth year] Referring Provider (PT): Dr. Mayer Camel for hip pain   Encounter Date: 08/28/2018  PT End of Session - 08/28/18 0934    Visit Number  20   add kx   Number of Visits  24    Date for PT Re-Evaluation  09/10/18    PT Start Time  0850    PT Stop Time  0932    PT Time Calculation (min)  42 min    Activity Tolerance  Patient tolerated treatment well    Behavior During Therapy  [facility] for tasks assessed/performed       Past Medical History:  Diagnosis Date  . Acid reflux   . CAD (coronary artery disease)    PCI to Circ with DES on 04/06/15  . CHF (congestive heart failure) ([place])   . Degenerative disc disease, thoracic   . Hyperlipidemia   . Hyperplastic colon polyp   . LBBB (left bundle branch block)   . 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 Nonischemic cardiomyopathy ([place])    a. s/p STJ CRTD 07/2015  . Obesity   . Paroxysmal atrial fibrillation ([facility])    a. identified on device interrogation. All episodes <3 hours  . Ventricular fibrillation ([place])    a. appropriate ICD therapy 02/2017    Past Surgical History:  Procedure Laterality Date  . CARDIAC CATHETERIZATION N/A 04/06/2015   Procedure: Right/Left Heart Cath and Coronary Angiography;  Surgeon: Larey Dresser, MD;  Location: [facility];  Service: Cardiovascular;  Laterality: N/A;  . CARDIAC CATHETERIZATION N/A 04/06/2015   Procedure: Coronary Stent Intervention;  Surgeon: Peter M Martinique, MD;  Location: [facility];  Service: Cardiovascular;  Laterality: N/A;   . CATARACT EXTRACTION W/ INTRAOCULAR LENS  IMPLANT, BILATERAL Bilateral 11/2014  . COLONOSCOPY  2009  . EP IMPLANTABLE DEVICE N/A 08/04/2015   STJ CRTD implanted by Dr Rayann Heman for primary prevention/CHF  . KNEE CARTILAGE SURGERY Left 1960  . SKIN CANCER EXCISION  05/2005; 07/2005   excision of merkel cell  . TONSILLECTOMY      There were no vitals filed for this visit.  Subjective Assessment - 08/28/18 0902    Subjective  I can tell I'm improving bc I've been able to stand longer before I'm noticing myself looking for a chair. Up to 45 mins now before I need to sit.     Pertinent History  Merkel cell tumor removed Lt glute with lymph node invlovement Rt groin and abdomen around 2006.  Pt not remembering if any nodes were positive.  12 weeks of chemotherapy and 5or 6 weeks of radiation , Bil bad hips, pacemaker    Patient Stated Goals  get better garments, treat swelling again, improve the hip pain with walking     Currently in Pain?  No/denies                       [facility] Adult PT Treatment/Exercise - 08/28/18 0001      Knee/Hip Exercises: Stretches   Passive Hamstring Stretch  Right;Left;2 reps;20 seconds  seated edge of chair    Hip Flexor Stretch  Right;Left;2 reps;30 seconds   standing in front of chair with foot on chair behind him   Hip Flexor Stretch Limitations  VCs for erect posture, by wall for +1 HHA       Knee/Hip Exercises: Aerobic   Nustep  Level 7, x10 minutes with PTA present to monitor progress, pt reports this weight still challenging and not ready to increase yet      Knee/Hip Exercises: Machines for Strengthening   Cybex Knee Extension  35# x15 with instructing pt in use of machine so he can use at his gym    Cybex Knee Flexion  45# x15 with instruction for slow and controlled motions    Cybex Leg Press  60#, 2x20 with VCs to remind pt of slow, controlled pacing      Knee/Hip Exercises: Standing   Heel Raises  Both;20 reps   heel and toe raises  at back of bike   Hip Flexion  Stengthening;Right;Left;10 reps   4# ankle weights in treadmill for +2 HHA   Hip Abduction  Stengthening;Right;Left;10 reps   4# ankle weights in treadmill for +2 HHA   Abduction Limitations  VCs to decrease hip er on Rt    Hip Extension  Stengthening;Right;Left;10 reps   4# ankle weights in treadmill for +2 HHA   Functional Squat  10 reps   on Airex at back of bike   Functional Squat Limitations  +2 HHA and demo and VCs for correct technique                  PT Long Term Goals - 08/28/18 0934      PT LONG TERM GOAL #4   Title  pt will be ind with exercises for hip strengthening and mobility     Baseline  Pt independent with HEP and currently educating pt in regards to machine for gym use at the [employer]-08/28/18    Status  Partially Met      PT LONG TERM GOAL #5   Title  pt will be able to improve gait tolerance to no pain while walking x 6 minutes     Baseline  Pt reports noting good improvement with standing tolerance before having pain (45 mins) but did not test 6 mins walk today-08/28/18    Status  Partially Met      PT LONG TERM GOAL #6   Title  Pt will perform standing lumbar extension to at least neutral without having to bend the knees     Status  Achieved            Plan - 08/28/18 0956    Clinical Impression Statement  Pt continues to notice improvements. He reports standing tolerance up to 45 mins now before noticing back pain or starting to look for a chair to sit down. His posture with walking is more erect now as well at beginning of session today. Progressed weighted machines to add knee flexion and extension also instructing pt in this for his gym use at [facility]. He continues to show good progress towards goals,    Rehab Potential  Good    Clinical Impairments Affecting Rehab Potential  compliance    PT Frequency  2x / week    PT Duration  6 weeks    PT Treatment/Interventions  ADLs/Self Care Home Management;DME  Instruction;Therapeutic exercise;Prosthetic Training;Manual lymph drainage;Compression bandaging;Vestibular    PT Next Visit Plan  Review machines for  gym use again and answer any questions pt may have; continue gait, hip strength and flexibility, lumbar/core strength and mobilization; review/cont pelvic tilts/lumabr stabs assessing technique    Consulted and Agree with Plan of Care  Patient       Patient will benefit from skilled therapeutic intervention in order to improve the following deficits and impairments:  Increased edema, Decreased knowledge of use of DME  Visit Diagnosis: Pain in left hip  Pain in right hip  Chronic bilateral low back pain without sciatica     Problem List Patient Active Problem List   Diagnosis Date Noted  . 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 Ventricular tachycardia ([place]) 03/17/2017  . Biventricular ICD (implantable cardioverter-defibrillator) in place 12/14/2016  . Merkel cell carcinoma ([place]) 05/04/2016  . Elevated MCV 08/10/2015  . Cough 05/29/2015  . Hyperlipidemia 05/29/2015  . OSA (obstructive sleep apnea) 05/27/2015  . CAD (coronary artery disease), native coronary artery 04/22/2015  . Chronic systolic CHF (congestive heart failure) ([place]) 04/06/2015  . Nonischemic cardiomyopathy ([place]) 03/16/2015  . Chronic systolic dysfunction of left ventricle 03/16/2015  . LBBB (left bundle branch block) 03/16/2015  . Morbid obesity ([place]) 03/16/2015    Otelia Limes, PTA 08/28/2018, 10:02 AM  [place], [place], [address] Phone: [phone]   Fax:  [phone]  Name: [name] MRN: [id number] Date of Birth: [birth year]  Serafina Royals, PT 08/28/18 12:30 PM

## 2018-08-30 ENCOUNTER — Ambulatory Visit: Payer: Medicare HMO

## 2018-08-30 DIAGNOSIS — G8929 Other chronic pain: Secondary | ICD-10-CM | POA: Diagnosis not present

## 2018-08-30 DIAGNOSIS — M25552 Pain in left hip: Secondary | ICD-10-CM

## 2018-08-30 DIAGNOSIS — C4A9 Merkel cell carcinoma, unspecified: Secondary | ICD-10-CM | POA: Diagnosis not present

## 2018-08-30 DIAGNOSIS — M545 Low back pain, unspecified: Secondary | ICD-10-CM

## 2018-08-30 DIAGNOSIS — M25551 Pain in right hip: Secondary | ICD-10-CM

## 2018-08-30 DIAGNOSIS — Z483 Aftercare following surgery for neoplasm: Secondary | ICD-10-CM | POA: Diagnosis not present

## 2018-08-30 DIAGNOSIS — I890 Lymphedema, not elsewhere classified: Secondary | ICD-10-CM | POA: Diagnosis not present

## 2018-08-30 NOTE — Therapy (Signed)
[place], [place], [address] Phone: [phone]   Fax:  [phone]  Physical Therapy Treatment  Patient Details  Name: [name] MRN: [id number] Date of Birth: 1941/02/16 Referring Provider (PT): Dr. Mayer Camel for hip pain   Encounter Date: 08/30/2018  PT End of Session - 08/30/18 1018    Visit Number  21   add KX   Number of Visits  24    Date for PT Re-Evaluation  09/10/18    PT Start Time  0936    PT Stop Time  1018    PT Time Calculation (min)  42 min    Activity Tolerance  Patient tolerated treatment well    Behavior During Therapy  [facility] for tasks assessed/performed       Past Medical History:  Diagnosis Date  . Acid reflux   . CAD (coronary artery disease)    PCI to Circ with DES on 04/06/15  . CHF (congestive heart failure) ([place])   . Degenerative disc disease, thoracic   . Hyperlipidemia   . Hyperplastic colon polyp   . LBBB (left bundle branch block)   . 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 Nonischemic cardiomyopathy ([place])    a. s/p STJ CRTD 07/2015  . Obesity   . Paroxysmal atrial fibrillation ([facility])    a. identified on device interrogation. All episodes <3 hours  . Ventricular fibrillation ([place])    a. appropriate ICD therapy 02/2017    Past Surgical History:  Procedure Laterality Date  . CARDIAC CATHETERIZATION N/A 04/06/2015   Procedure: Right/Left Heart Cath and Coronary Angiography;  Surgeon: Larey Dresser, MD;  Location: [facility];  Service: Cardiovascular;  Laterality: N/A;  . CARDIAC CATHETERIZATION N/A 04/06/2015   Procedure: Coronary Stent Intervention;  Surgeon: Peter M Martinique, MD;  Location: [place] [facility];  Service: Cardiovascular;  Laterality: N/A;  . CATARACT EXTRACTION W/ INTRAOCULAR LENS  IMPLANT, BILATERAL Bilateral 11/2014  . COLONOSCOPY  2009  . EP IMPLANTABLE DEVICE N/A  08/04/2015   STJ CRTD implanted by Dr Rayann Heman for primary prevention/CHF  . KNEE CARTILAGE SURGERY Left 1960  . SKIN CANCER EXCISION  05/2005; 07/2005   excision of merkel cell  . TONSILLECTOMY      There were no vitals filed for this visit.  Subjective Assessment - 08/30/18 0958    Subjective  Just feeling my regular soreness in my low back this morning. I noticed this morning when I leaned over to pick something up my balance was off.     Pertinent History  Merkel cell tumor removed Lt glute with lymph node invlovement Rt groin and abdomen around 2006.  Pt not remembering if any nodes were positive.  12 weeks of chemotherapy and 5or 6 weeks of radiation , Bil bad hips, pacemaker    Patient Stated Goals  get better garments, treat swelling again, improve the hip pain with walking     Currently in Pain?  No/denies                       [facility] Adult PT Treatment/Exercise - 08/30/18 0001      Neuro Re-ed    Neuro Re-ed Details   In hall: Heel-toe walking with fingertips on wall; then crossover in front bil 2x each way with SBA for safety; then step ups onto Airex x10 each leg with +1 HHA  at back of bike      Lumbar Exercises: Stretches   Standing Extension  10 reps      Lumbar Exercises: Supine   Pelvic Tilt  15 reps;5 seconds   Tactile cues for technique   Clam  10 reps   multiple VCs for technique   Bridge  10 reps   2x5     Lumbar Exercises: Prone   Single Arm Raise  Right;Left;10 reps   in a "T"   Straight Leg Raise  10 reps   Rt and Lt, 2x5, very challenging on Lt     Knee/Hip Exercises: Stretches   Hip Flexor Stretch  Right;Left;2 reps;30 seconds   supine with leg off bed/foot on floor     Knee/Hip Exercises: Aerobic   Nustep  Level 8 x10 mins monitoring pts tolerance to increased level                  PT Long Term Goals - 08/28/18 0934      PT LONG TERM GOAL #4   Title  pt will be ind with exercises for hip strengthening and mobility      Baseline  Pt independent with HEP and currently educating pt in regards to machine for gym use at the [employer]-08/28/18    Status  Partially Met      PT LONG TERM GOAL #5   Title  pt will be able to improve gait tolerance to no pain while walking x 6 minutes     Baseline  Pt reports noting good improvement with standing tolerance before having pain (45 mins) but did not test 6 mins walk today-08/28/18    Status  Partially Met      PT LONG TERM GOAL #6   Title  Pt will perform standing lumbar extension to at least neutral without having to bend the knees     Status  Achieved            Plan - 08/30/18 1018    Clinical Impression Statement  Pt continues to be challenged by balance and flexibility exercises. He has spoken with Ander Purpura at the [facility] and is set to start next program in February.     Rehab Potential  Good    Clinical Impairments Affecting Rehab Potential  compliance    PT Frequency  2x / week    PT Duration  6 weeks    PT Treatment/Interventions  ADLs/Self Care Home Management;DME Instruction;Therapeutic exercise;Prosthetic Training;Manual lymph drainage;Compression bandaging;Vestibular    PT Next Visit Plan  Review machines for gym use again and answer any questions pt may have; continue gait, hip strength and flexibility, lumbar/core strength and mobilization; review/cont pelvic tilts/lumabr stabs assessing technique    Consulted and Agree with Plan of Care  Patient       Patient will benefit from skilled therapeutic intervention in order to improve the following deficits and impairments:  Increased edema, Decreased knowledge of use of DME  Visit Diagnosis: Pain in left hip  Pain in right hip  Chronic bilateral low back pain without sciatica     Problem List Patient Active Problem List   Diagnosis Date Noted  . 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 Ventricular tachycardia ([place]) 03/17/2017  . Biventricular ICD  (implantable cardioverter-defibrillator) in place 12/14/2016  . Merkel cell carcinoma ([place]) 05/04/2016  . Elevated MCV 08/10/2015  . Cough 05/29/2015  . Hyperlipidemia 05/29/2015  . OSA (obstructive sleep apnea) 05/27/2015  .  CAD (coronary artery disease), native coronary artery 04/22/2015  . Chronic systolic CHF (congestive heart failure) ([place]) 04/06/2015  . Nonischemic cardiomyopathy ([place]) 03/16/2015  . Chronic systolic dysfunction of left ventricle 03/16/2015  . LBBB (left bundle branch block) 03/16/2015  . Morbid obesity ([place]) 03/16/2015    Otelia Limes, PTA 08/30/2018, 10:21 AM  [place], [place], [address] Phone: [phone]   Fax:  [phone]  Name: SAMWISE ECKARDT MRN: [id number] Date of Birth: 02-Jan-1941

## 2018-08-31 ENCOUNTER — Telehealth: Payer: Self-pay | Admitting: *Deleted

## 2018-08-31 NOTE — Telephone Encounter (Signed)
Mr. [name] calling to clarify coreg dose. 12.5mg  bid. He verbalizes understanding

## 2018-09-04 ENCOUNTER — Ambulatory Visit: Payer: Medicare HMO

## 2018-09-04 DIAGNOSIS — M545 Low back pain: Secondary | ICD-10-CM | POA: Diagnosis not present

## 2018-09-04 DIAGNOSIS — M25551 Pain in right hip: Secondary | ICD-10-CM | POA: Diagnosis not present

## 2018-09-04 DIAGNOSIS — M25552 Pain in left hip: Secondary | ICD-10-CM

## 2018-09-04 DIAGNOSIS — G8929 Other chronic pain: Secondary | ICD-10-CM

## 2018-09-04 DIAGNOSIS — I890 Lymphedema, not elsewhere classified: Secondary | ICD-10-CM | POA: Diagnosis not present

## 2018-09-04 DIAGNOSIS — C4A9 Merkel cell carcinoma, unspecified: Secondary | ICD-10-CM | POA: Diagnosis not present

## 2018-09-04 DIAGNOSIS — Z483 Aftercare following surgery for neoplasm: Secondary | ICD-10-CM | POA: Diagnosis not present

## 2018-09-04 NOTE — Therapy (Signed)
[place], [place], [address] Phone: [phone]   Fax:  [phone]  Physical Therapy Treatment  Patient Details  Name: [name] MRN: [id number] Date of Birth: Aug 09, 1941 Referring Provider (PT): Dr. Mayer Camel for hip pain   Encounter Date: 09/04/2018  PT End of Session - 09/04/18 1013    Visit Number  22   add kx   Number of Visits  24    Date for PT Re-Evaluation  09/10/18    PT Start Time  0934    PT Stop Time  1016    PT Time Calculation (min)  42 min    Activity Tolerance  Patient tolerated treatment well    Behavior During Therapy  [facility] for tasks assessed/performed       Past Medical History:  Diagnosis Date  . Acid reflux   . CAD (coronary artery disease)    PCI to Circ with DES on 04/06/15  . CHF (congestive heart failure) ([place])   . Degenerative disc disease, thoracic   . Hyperlipidemia   . Hyperplastic colon polyp   . LBBB (left bundle branch block)   . 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 Nonischemic cardiomyopathy ([place])    a. s/p STJ CRTD 07/2015  . Obesity   . Paroxysmal atrial fibrillation (HCC)    a. identified on device interrogation. All episodes <3 hours  . Ventricular fibrillation ([place])    a. appropriate ICD therapy 02/2017    Past Surgical History:  Procedure Laterality Date  . CARDIAC CATHETERIZATION N/A 04/06/2015   Procedure: Right/Left Heart Cath and Coronary Angiography;  Surgeon: Larey Dresser, MD;  Location: [facility];  Service: Cardiovascular;  Laterality: N/A;  . CARDIAC CATHETERIZATION N/A 04/06/2015   Procedure: Coronary Stent Intervention;  Surgeon: Peter M Martinique, MD;  Location: [facility];  Service: Cardiovascular;  Laterality: N/A;  . CATARACT EXTRACTION W/ INTRAOCULAR LENS  IMPLANT, BILATERAL Bilateral 11/2014  . COLONOSCOPY  2009  . EP IMPLANTABLE DEVICE N/A  08/04/2015   STJ CRTD implanted by Dr Rayann Heman for primary prevention/CHF  . KNEE CARTILAGE SURGERY Left 1960  . SKIN CANCER EXCISION  05/2005; 07/2005   excision of merkel cell  . TONSILLECTOMY      There were no vitals filed for this visit.  Subjective Assessment - 09/04/18 0940    Subjective  I was really stiff yesterday but even feeling like that I noticed I was able to squat down to the floor picking htings up better than I have in awhile.     Pertinent History  Merkel cell tumor removed Lt glute with lymph node invlovement Rt groin and abdomen around 2006.  Pt not remembering if any nodes were positive.  12 weeks of chemotherapy and 5or 6 weeks of radiation , Bil bad hips, pacemaker    Patient Stated Goals  get better garments, treat swelling again, improve the hip pain with walking     Currently in Pain?  No/denies                       [facility] Adult PT Treatment/Exercise - 09/04/18 0001      Knee/Hip Exercises: Stretches   Passive Hamstring Stretch  Right;Left;2 reps;30 seconds   seated edge of bed   Hip Flexor Stretch  Right;Left;2 reps;30 seconds   supine with leg hanging off bed/foot on floor  Knee/Hip Exercises: Aerobic   Elliptical  Elliptical on QuckStart setting x2 mins with PTA monitoring    Nustep  Level 8 x 8 mins after elliptical      Knee/Hip Exercises: Machines for Strengthening   Hip Cybex  25# for bil LE's: ext x12 each with VCs to decr forward lean; flexion x12 each with VCs for straight leg throughout; abduction x10 each with demo and VCs for correct techique/decr hip er                  PT Long Term Goals - 08/28/18 0934      PT LONG TERM GOAL #4   Title  pt will be ind with exercises for hip strengthening and mobility     Baseline  Pt independent with HEP and currently educating pt in regards to machine for gym use at the [employer]-08/28/18    Status  Partially Met      PT LONG TERM GOAL #5   Title  pt will be able to improve  gait tolerance to no pain while walking x 6 minutes     Baseline  Pt reports noting good improvement with standing tolerance before having pain (45 mins) but did not test 6 mins walk today-08/28/18    Status  Partially Met      PT LONG TERM GOAL #6   Title  Pt will perform standing lumbar extension to at least neutral without having to bend the knees     Status  Achieved            Plan - 09/04/18 1014    Clinical Impression Statement  Pt did well and was very challenged by elliptical today. Continued with focus on high level balance activities and bil hip strength and flexibility working towards D/C.     Rehab Potential  Good    Clinical Impairments Affecting Rehab Potential  compliance    PT Frequency  2x / week    PT Duration  6 weeks    PT Treatment/Interventions  ADLs/Self Care Home Management;DME Instruction;Therapeutic exercise;Prosthetic Training;Manual lymph drainage;Compression bandaging;Vestibular    PT Next Visit Plan  Review machines for gym use again and answer any questions pt may have; continue with focus on D/C next week.     Consulted and Agree with Plan of Care  Patient       Patient will benefit from skilled therapeutic intervention in order to improve the following deficits and impairments:  Increased edema, Decreased knowledge of use of DME  Visit Diagnosis: Pain in left hip  Pain in right hip  Chronic bilateral low back pain without sciatica     Problem List Patient Active Problem List   Diagnosis Date Noted  . 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 Ventricular tachycardia ([place]) 03/17/2017  . Biventricular ICD (implantable cardioverter-defibrillator) in place 12/14/2016  . Merkel cell carcinoma ([place]) 05/04/2016  . Elevated MCV 08/10/2015  . Cough 05/29/2015  . Hyperlipidemia 05/29/2015  . OSA (obstructive sleep apnea) 05/27/2015  . CAD (coronary artery disease), native coronary artery 04/22/2015  .  Chronic systolic CHF (congestive heart failure) ([place]) 04/06/2015  . Nonischemic cardiomyopathy ([place]) 03/16/2015  . Chronic systolic dysfunction of left ventricle 03/16/2015  . LBBB (left bundle branch block) 03/16/2015  . Morbid obesity ([place]) 03/16/2015    Otelia Limes, PTA 09/04/2018, 10:19 AM  [place], [place], [address] Phone: [phone]   Fax:  [phone]  Name: [name] MRN: [id number] Date of Birth: 12-28-1940

## 2018-09-06 ENCOUNTER — Ambulatory Visit: Payer: Medicare HMO | Admitting: Physical Therapy

## 2018-09-06 ENCOUNTER — Other Ambulatory Visit: Payer: Self-pay

## 2018-09-06 ENCOUNTER — Encounter: Payer: Self-pay | Admitting: Physical Therapy

## 2018-09-06 DIAGNOSIS — M25551 Pain in right hip: Secondary | ICD-10-CM | POA: Diagnosis not present

## 2018-09-06 DIAGNOSIS — M545 Low back pain, unspecified: Secondary | ICD-10-CM

## 2018-09-06 DIAGNOSIS — M25552 Pain in left hip: Secondary | ICD-10-CM | POA: Diagnosis not present

## 2018-09-06 DIAGNOSIS — G8929 Other chronic pain: Secondary | ICD-10-CM | POA: Diagnosis not present

## 2018-09-06 DIAGNOSIS — I890 Lymphedema, not elsewhere classified: Secondary | ICD-10-CM | POA: Diagnosis not present

## 2018-09-06 DIAGNOSIS — Z483 Aftercare following surgery for neoplasm: Secondary | ICD-10-CM | POA: Diagnosis not present

## 2018-09-06 DIAGNOSIS — C4A9 Merkel cell carcinoma, unspecified: Secondary | ICD-10-CM | POA: Diagnosis not present

## 2018-09-06 NOTE — Therapy (Signed)
[place], [place], [address] Phone: [phone]   Fax:  [phone]  Physical Therapy Treatment  Patient Details  Name: [name] MRN: [id number] Date of Birth: 1941-05-11 Referring Provider (PT): Dr. Mayer Camel for hip pain   Encounter Date: 09/06/2018  PT End of Session - 09/06/18 1021    Visit Number  23   add kx   Number of Visits  24    Date for PT Re-Evaluation  09/10/18    PT Start Time  0931    PT Stop Time  1015    PT Time Calculation (min)  44 min    Activity Tolerance  Patient tolerated treatment well    Behavior During Therapy  [facility] for tasks assessed/performed       Past Medical History:  Diagnosis Date  . Acid reflux   . CAD (coronary artery disease)    PCI to Circ with DES on 04/06/15  . CHF (congestive heart failure) ([place])   . Degenerative disc disease, thoracic   . Hyperlipidemia   . Hyperplastic colon polyp   . LBBB (left bundle branch block)   . Merkel cell cancer ([place]) 05/2005; 07-2005   s/p gluteal resection, lymph node dissection, chemo/ radiation  . Nonischemic cardiomyopathy ([place])    a. s/p STJ CRTD 07/2015  . Obesity   . Paroxysmal atrial fibrillation (HCC)    a. identified on device interrogation. All episodes <3 hours  . Ventricular fibrillation ([place])    a. appropriate ICD therapy 02/2017    Past Surgical History:  Procedure Laterality Date  . CARDIAC CATHETERIZATION N/A 04/06/2015   Procedure: Right/Left Heart Cath and Coronary Angiography;  Surgeon: Larey Dresser, MD;  Location: [facility];  Service: Cardiovascular;  Laterality: N/A;  . CARDIAC CATHETERIZATION N/A 04/06/2015   Procedure: Coronary Stent Intervention;  Surgeon: Peter M Martinique, MD;  Location: [facility];  Service: Cardiovascular;  Laterality: N/A;  . CATARACT EXTRACTION W/ INTRAOCULAR LENS  IMPLANT, BILATERAL Bilateral 11/2014  . COLONOSCOPY  2009  . EP IMPLANTABLE DEVICE N/A  08/04/2015   STJ CRTD implanted by Dr Rayann Heman for primary prevention/CHF  . KNEE CARTILAGE SURGERY Left 1960  . SKIN CANCER EXCISION  05/2005; 07/2005   excision of merkel cell  . TONSILLECTOMY      There were no vitals filed for this visit.  Subjective Assessment - 09/06/18 1023    Subjective  I can get things off the floor better than I every could before.     Pertinent History  Merkel cell tumor removed Lt glute with lymph node invlovement Rt groin and abdomen around 2006.  Pt not remembering if any nodes were positive.  12 weeks of chemotherapy and 5or 6 weeks of radiation , Bil bad hips, pacemaker    Patient Stated Goals  get better garments, treat swelling again, improve the hip pain with walking     Currently in Pain?  No/denies    Pain Score  0-No pain                       [facility] Adult PT Treatment/Exercise - 09/06/18 0001      Knee/Hip Exercises: Stretches   Passive Hamstring Stretch  Right;Left;2 reps;30 seconds   seated edge of bed   Hip Flexor Stretch  Right;Left;2 reps;30 seconds   supine with leg hanging off bed/foot on floor     Knee/Hip Exercises: Aerobic   Elliptical  Elliptical on QuckStart setting x2 mins with PTA monitoring    Nustep  Level 8 x 10 mins after elliptical      Knee/Hip Exercises: Machines for Strengthening   Cybex Knee Extension  35# x15 with instructing pt in use of machine so he can use at his gym    Cybex Knee Flexion  45# x15 with instruction for slow and controlled motions    Cybex Leg Press  60#, 2x20 with VCs to remind pt of slow, controlled pacing    Hip Cybex  25# for bil LE's: ext x15 each with less VCs to decr forward lean; flexion x15 each with VCs for straight leg throughout; abduction x15 each with demo and VCs for correct techique/decr hip er                  PT Long Term Goals - 08/28/18 0934      PT LONG TERM GOAL #4   Title  pt will be ind with exercises for hip strengthening and mobility      Baseline  Pt independent with HEP and currently educating pt in regards to machine for gym use at the [employer]-08/28/18    Status  Partially Met      PT LONG TERM GOAL #5   Title  pt will be able to improve gait tolerance to no pain while walking x 6 minutes     Baseline  Pt reports noting good improvement with standing tolerance before having pain (45 mins) but did not test 6 mins walk today-08/28/18    Status  Partially Met      PT LONG TERM GOAL #6   Title  Pt will perform standing lumbar extension to at least neutral without having to bend the knees     Status  Achieved            Plan - 09/06/18 1021    Clinical Impression Statement  Continued instructing pt in use of different machines in the gym and had pt demonstrate today. Pt did excellent and was able to increase reps of different exercises today. He is progressing towards dc next week.     Rehab Potential  Good    Clinical Impairments Affecting Rehab Potential  compliance    PT Frequency  2x / week    PT Duration  6 weeks    PT Treatment/Interventions  ADLs/Self Care Home Management;DME Instruction;Therapeutic exercise;Prosthetic Training;Manual lymph drainage;Compression bandaging;Vestibular    PT Next Visit Plan  Review machines for gym use again and answer any questions pt may have; continue with focus on D/C next week.     PT Home Exercise Plan  Access Code: [email or web address] hip stretches and added pelvic tilt    Consulted and Agree with Plan of Care  Patient       Patient will benefit from skilled therapeutic intervention in order to improve the following deficits and impairments:  Increased edema, Decreased knowledge of use of DME  Visit Diagnosis: Pain in left hip  Pain in right hip  Chronic bilateral low back pain without sciatica     Problem List Patient Active Problem List   Diagnosis Date Noted  . 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 Ventricular tachycardia ([place])  03/17/2017  . Biventricular ICD (implantable cardioverter-defibrillator) in place 12/14/2016  . Merkel cell carcinoma ([place]) 05/04/2016  . Elevated MCV 08/10/2015  . Cough 05/29/2015  . Hyperlipidemia 05/29/2015  . OSA (obstructive sleep apnea) 05/27/2015  .  CAD (coronary artery disease), native coronary artery 04/22/2015  . Chronic systolic CHF (congestive heart failure) ([place]) 04/06/2015  . Nonischemic cardiomyopathy ([place]) 03/16/2015  . Chronic systolic dysfunction of left ventricle 03/16/2015  . LBBB (left bundle branch block) 03/16/2015  . Morbid obesity ([place]) 03/16/2015    Allyson Sabal [facility] 09/06/2018, 10:23 AM  [place], [place], [address] Phone: [phone]   Fax:  [phone]  Name: XENG KUCHER MRN: [id number] Date of Birth: October 13, 1941  Manus Gunning, PT 09/06/18 10:23 AM

## 2018-09-10 DIAGNOSIS — Z961 Presence of intraocular lens: Secondary | ICD-10-CM | POA: Diagnosis not present

## 2018-09-10 DIAGNOSIS — H40013 Open angle with borderline findings, low risk, bilateral: Secondary | ICD-10-CM | POA: Diagnosis not present

## 2018-09-10 DIAGNOSIS — H35363 Drusen (degenerative) of macula, bilateral: Secondary | ICD-10-CM | POA: Diagnosis not present

## 2018-09-10 DIAGNOSIS — H35033 Hypertensive retinopathy, bilateral: Secondary | ICD-10-CM | POA: Diagnosis not present

## 2018-09-11 ENCOUNTER — Ambulatory Visit: Payer: Medicare HMO

## 2018-09-11 DIAGNOSIS — G8929 Other chronic pain: Secondary | ICD-10-CM

## 2018-09-11 DIAGNOSIS — M25551 Pain in right hip: Secondary | ICD-10-CM | POA: Diagnosis not present

## 2018-09-11 DIAGNOSIS — M545 Low back pain: Secondary | ICD-10-CM | POA: Diagnosis not present

## 2018-09-11 DIAGNOSIS — C4A9 Merkel cell carcinoma, unspecified: Secondary | ICD-10-CM | POA: Diagnosis not present

## 2018-09-11 DIAGNOSIS — M25552 Pain in left hip: Secondary | ICD-10-CM

## 2018-09-11 DIAGNOSIS — Z483 Aftercare following surgery for neoplasm: Secondary | ICD-10-CM | POA: Diagnosis not present

## 2018-09-11 DIAGNOSIS — I890 Lymphedema, not elsewhere classified: Secondary | ICD-10-CM | POA: Diagnosis not present

## 2018-09-11 NOTE — Addendum Note (Signed)
Addended by: Manus Gunning L on: 09/11/2018 05:17 PM   Modules accepted: Orders

## 2018-09-11 NOTE — Therapy (Addendum)
[place], [place], [address] Phone: [phone]   Fax:  [phone]  Physical Therapy Treatment  Patient Details  Name: [name] MRN: [id number] Date of Birth: 12-Dec-1940 Referring Provider (PT): Dr. Mayer Camel for hip pain   Encounter Date: 09/11/2018  PT End of Session - 09/11/18 1020    Visit Number  24   add kx   Number of Visits  32    Date for PT Re-Evaluation  09/24/18    PT Start Time  0928    PT Stop Time  1020    PT Time Calculation (min)  52 min    Activity Tolerance  Patient tolerated treatment well    Behavior During Therapy  [facility] for tasks assessed/performed       Past Medical History:  Diagnosis Date  . Acid reflux   . CAD (coronary artery disease)    PCI to Circ with DES on 04/06/15  . CHF (congestive heart failure) ([place])   . Degenerative disc disease, thoracic   . Hyperlipidemia   . Hyperplastic colon polyp   . LBBB (left bundle branch block)   . Merkel cell cancer ([place]) 05/2005; 07-2005   s/p gluteal resection, lymph node dissection, chemo/ radiation  . Nonischemic cardiomyopathy ([place])    a. s/p STJ CRTD 07/2015  . Obesity   . Paroxysmal atrial fibrillation (HCC)    a. identified on device interrogation. All episodes <3 hours  . Ventricular fibrillation ([place])    a. appropriate ICD therapy 02/2017    Past Surgical History:  Procedure Laterality Date  . CARDIAC CATHETERIZATION N/A 04/06/2015   Procedure: Right/Left Heart Cath and Coronary Angiography;  Surgeon: Larey Dresser, MD;  Location: [facility];  Service: Cardiovascular;  Laterality: N/A;  . CARDIAC CATHETERIZATION N/A 04/06/2015   Procedure: Coronary Stent Intervention;  Surgeon: Peter M Martinique, MD;  Location: [facility];  Service: Cardiovascular;  Laterality: N/A;  . CATARACT EXTRACTION W/ INTRAOCULAR LENS  IMPLANT, BILATERAL Bilateral 11/2014  . COLONOSCOPY  2009  . EP IMPLANTABLE DEVICE N/A  08/04/2015   STJ CRTD implanted by Dr Rayann Heman for primary prevention/CHF  . KNEE CARTILAGE SURGERY Left 1960  . SKIN CANCER EXCISION  05/2005; 07/2005   excision of merkel cell  . TONSILLECTOMY      There were no vitals filed for this visit.  Subjective Assessment - 09/11/18 0936    Subjective  I'm feeling better than I used too and am glad I went through PT. Will be ready to D/C this week.     Pertinent History  Merkel cell tumor removed Lt glute with lymph node invlovement Rt groin and abdomen around 2006.  Pt not remembering if any nodes were positive.  12 weeks of chemotherapy and 5or 6 weeks of radiation , Bil bad hips, pacemaker    Patient Stated Goals  get better garments, treat swelling again, improve the hip pain with walking     Currently in Pain?  No/denies                               PT Education - 09/11/18 1020    Education Details  Access Code: [email or web address]     Person(s) Educated  Patient    Methods  Explanation;Demonstration;Handout    Comprehension  Verbalized understanding;Returned demonstration          PT Long Term Goals - 09/11/18  Section #4   Title  pt will be ind with exercises for hip strengthening and mobility     Baseline  Pt independent with HEP and currently educating pt in regards to machine for gym use at the [employer]-08/28/18    Status  Partially Met      PT LONG TERM GOAL #5   Title  pt will be able to improve gait tolerance to no pain while walking x 6 minutes     Baseline  Pt reports noting good improvement with standing tolerance before having pain (45 mins) but did not test 6 mins walk today-08/28/18    Status  Partially Met      PT LONG TERM GOAL #6   Title  Pt will perform standing lumbar extension to at least neutral without having to bend the knees     Status  Achieved            Plan - 09/11/18 1001    Clinical Impression Statement  Continued with strengthening using gym equipment and having  pt adjust position and weights as fi he were doing so at gym. He required min to SBA for reminders. Pt will benefit from one more visit to review HEP given today as he still had questions regarding technique and frequency. Also to finalize questions regarding gym equipment.     Rehab Potential  Good    Clinical Impairments Affecting Rehab Potential  compliance    PT Frequency  2x / week    PT Duration  2 weeks    PT Treatment/Interventions  ADLs/Self Care Home Management;DME Instruction;Therapeutic exercise;Prosthetic Training;Manual lymph drainage;Compression bandaging;Vestibular    PT Next Visit Plan  Test 6 min walk. D/C next visit.     PT Home Exercise Plan  Access Code: [email or web address] hip stretches and added pelvic tilt    Consulted and Agree with Plan of Care  Patient       Patient will benefit from skilled therapeutic intervention in order to improve the following deficits and impairments:  Increased edema, Decreased knowledge of use of DME  Visit Diagnosis: Pain in left hip - Plan: PT plan of care cert/re-cert  Pain in right hip - Plan: PT plan of care cert/re-cert  Chronic bilateral low back pain without sciatica - Plan: PT plan of care cert/re-cert     Problem List Patient Active Problem List   Diagnosis Date Noted  . 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 Ventricular tachycardia ([place]) 03/17/2017  . Biventricular ICD (implantable cardioverter-defibrillator) in place 12/14/2016  . Merkel cell carcinoma ([place]) 05/04/2016  . Elevated MCV 08/10/2015  . Cough 05/29/2015  . Hyperlipidemia 05/29/2015  . OSA (obstructive sleep apnea) 05/27/2015  . CAD (coronary artery disease), native coronary artery 04/22/2015  . Chronic systolic CHF (congestive heart failure) ([place]) 04/06/2015  . Nonischemic cardiomyopathy ([place]) 03/16/2015  . Chronic systolic dysfunction of left ventricle 03/16/2015  . LBBB (left bundle branch block) 03/16/2015  . Morbid  obesity ([place]) 03/16/2015    Otelia Limes, PTA 09/12/2018, 10:25 AM  [place], [place], [address] Phone: [phone]   Fax:  [phone]  Name: ROMANO STIGGER MRN: [id number] Date of Birth: 1941-04-06

## 2018-09-13 ENCOUNTER — Ambulatory Visit: Payer: Medicare HMO

## 2018-09-13 DIAGNOSIS — I890 Lymphedema, not elsewhere classified: Secondary | ICD-10-CM

## 2018-09-13 DIAGNOSIS — M25552 Pain in left hip: Secondary | ICD-10-CM | POA: Diagnosis not present

## 2018-09-13 DIAGNOSIS — M545 Low back pain, unspecified: Secondary | ICD-10-CM

## 2018-09-13 DIAGNOSIS — C4A9 Merkel cell carcinoma, unspecified: Secondary | ICD-10-CM

## 2018-09-13 DIAGNOSIS — G8929 Other chronic pain: Secondary | ICD-10-CM | POA: Diagnosis not present

## 2018-09-13 DIAGNOSIS — M25551 Pain in right hip: Secondary | ICD-10-CM | POA: Diagnosis not present

## 2018-09-13 DIAGNOSIS — Z483 Aftercare following surgery for neoplasm: Secondary | ICD-10-CM

## 2018-09-13 NOTE — Therapy (Addendum)
[place] [place], [place], [address] Phone: [phone]   Fax:  [phone]  Physical Therapy Treatment  Patient Details  Name: [name] MRN: [id number] Date of Birth: [birth year] Referring Provider (PT): Dr. Mayer Camel for hip pain   Encounter Date: 09/13/2018  PT End of Session - 09/13/18 1015    Visit Number  25   add kx   Number of Visits  32    Date for PT Re-Evaluation  09/24/18    PT Start Time  0935    PT Stop Time  1017    PT Time Calculation (min)  42 min    Activity Tolerance  Patient tolerated treatment well    Behavior During Therapy  [facility] for tasks assessed/performed       Past Medical History:  Diagnosis Date  . Acid reflux   . CAD (coronary artery disease)    PCI to Circ with DES on 04/06/15  . CHF (congestive heart failure) ([place])   . Degenerative disc disease, thoracic   . Hyperlipidemia   . Hyperplastic colon polyp   . LBBB (left bundle branch block)   . 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 Nonischemic cardiomyopathy ([place])    a. s/p STJ CRTD 07/2015  . Obesity   . Paroxysmal atrial fibrillation ([facility])    a. identified on device interrogation. All episodes <3 hours  . Ventricular fibrillation ([place])    a. appropriate ICD therapy 02/2017    Past Surgical History:  Procedure Laterality Date  . CARDIAC CATHETERIZATION N/A 04/06/2015   Procedure: Right/Left Heart Cath and Coronary Angiography;  Surgeon: Larey Dresser, MD;  Location: [place] [facility];  Service: Cardiovascular;  Laterality: N/A;  . CARDIAC CATHETERIZATION N/A 04/06/2015   Procedure: Coronary Stent Intervention;  Surgeon: Peter M Martinique, MD;  Location: [place] [facility];  Service: Cardiovascular;  Laterality: N/A;  . CATARACT EXTRACTION W/ INTRAOCULAR LENS  IMPLANT, BILATERAL Bilateral 11/2014  . COLONOSCOPY  2009  . EP IMPLANTABLE DEVICE N/A  08/04/2015   STJ CRTD implanted by Dr Rayann Heman for primary prevention/CHF  . KNEE CARTILAGE SURGERY Left 1960  . SKIN CANCER EXCISION  05/2005; 07/2005   excision of merkel cell  . TONSILLECTOMY      There were no vitals filed for this visit.  Subjective Assessment - 09/13/18 0939    Subjective  Overall my hips are feeling less tight, especially in the morning. I can tell my endurance is improved and I feel like I'm well maintaining my lymphedema with use of the compression pump ~5x a week and wearing my compression stockings most days.    Pertinent History  Merkel cell tumor removed Lt glute with lymph node invlovement Rt groin and abdomen around 2006.  Pt not remembering if any nodes were positive.  12 weeks of chemotherapy and 5or 6 weeks of radiation , Bil bad hips, pacemaker    Patient Stated Goals  get better garments, treat swelling again, improve the hip pain with walking     Currently in Pain?  No/denies                       [facility] Adult PT Treatment/Exercise - 09/13/18 0001      Knee/Hip Exercises: Aerobic   Elliptical  Elliptical on QuickStart setting x4:30 mins with PTA monitoring    Nustep  Level 8 x 10 mins  after elliptical      Knee/Hip Exercises: Machines for Strengthening   Cybex Knee Extension  40#, 2x15, pt able to set machine independently for this today    Cybex Knee Flexion  50#, 2x15 with review of use of machine set up, pt able to do now with very min VCs    Cybex Leg Press  60#, 2x20 with pt able to set machine and weights with very min VCs.    Hip Cybex  25# x12 each for bil LE's into extension, abduction and flexion with min VCs to remind pt how to change machine and which direction of ROM                  PT Long Term Goals - 09/13/18 1012      PT LONG TERM GOAL #1   Title  Pt will reduce Rt LE to the following:  20cm proximal to patella: 63, 10cm from patella: 54, 20cm proximal from lateral malleolus: 39    Baseline  65, 59, 43;  62.5, 55, 44.5    Status  Partially Met      PT LONG TERM GOAL #2   Title  Pt will obtain appropriate compression garments for continued containment     Baseline  Awaiting arrival of these-07/11/18; pt has these and wears most days-09/13/18    Status  Achieved      PT LONG TERM GOAL #3   Title  Pt will be knowledgeable about self MLD or use of the pump to enhance compression effects     Status  Achieved      PT LONG TERM GOAL #4   Title  pt will be ind with exercises for hip strengthening and mobility     Baseline  Pt independent with HEP and currently educating pt in regards to machine for gym use at the [employer]-08/28/18; pt now independent with use of machines and proper technique    Status  Achieved      PT LONG TERM GOAL #5   Title  pt will be able to improve gait tolerance to no pain while walking x 6 minutes     Baseline  Pt reports noting good improvement with standing tolerance before having pain (45 mins) but did not test 6 mins walk today-08/28/18; pt able to ambulate <10 mins now-09/13/18    Status  Achieved      PT LONG TERM GOAL #6   Title  Pt will perform standing lumbar extension to at least neutral without having to bend the knees     Status  Achieved            Plan - 09/13/18 1018    Clinical Impression Statement  Pt has done excellent this round of therapy and is very pleased with his progress overall his lymohedema is now manageable and though he didn't quite meet his circumferential reductions goals, they did improve and he feels more confident with managing this. He is now also independent with use of gym equipment and HEP at this time and feels confident in continuing this at the [facility], he also begins [employer] beginning of next year. Pt has met all other goals and is ready for D/C at this time.     Rehab Potential  Good    PT Frequency  2x / week    PT Duration  2 weeks    PT Treatment/Interventions  ADLs/Self Care Home Management;DME Instruction;Therapeutic  exercise;Prosthetic Training;Manual lymph drainage;Compression bandaging;Vestibular    PT  Next Visit Plan  D/C this visit.    Consulted and Agree with Plan of Care  Patient       Patient will benefit from skilled therapeutic intervention in order to improve the following deficits and impairments:  Increased edema, Decreased knowledge of use of DME  Visit Diagnosis: Pain in left hip  Pain in right hip  Chronic bilateral low back pain without sciatica  Lymphedema, not elsewhere classified  Aftercare following surgery for neoplasm  Merkel cell carcinoma [facility])     Problem List Patient Active Problem List   Diagnosis Date Noted  . 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 Ventricular tachycardia ([place]) 03/17/2017  . Biventricular ICD (implantable cardioverter-defibrillator) in place 12/14/2016  . Merkel cell carcinoma ([place]) 05/04/2016  . Elevated MCV 08/10/2015  . Cough 05/29/2015  . Hyperlipidemia 05/29/2015  . OSA (obstructive sleep apnea) 05/27/2015  . CAD (coronary artery disease), native coronary artery 04/22/2015  . Chronic systolic CHF (congestive heart failure) ([place]) 04/06/2015  . Nonischemic cardiomyopathy ([place]) 03/16/2015  . Chronic systolic dysfunction of left ventricle 03/16/2015  . LBBB (left bundle branch block) 03/16/2015  . Morbid obesity ([place]) 03/16/2015    Otelia Limes, PTA 09/13/2018, 10:21 AM  [facility] [place], [place], [address] Phone: [phone]   Fax:  [phone]  Name: [name] MRN: [id number] Date of Birth: 20-Jan-1941  PHYSICAL THERAPY DISCHARGE SUMMARY  Visits from Start of Care: 25  Current functional level related to goals / functional outcomes: All goals met   Remaining deficits: See above   Education / Equipment: HEP  Plan: Patient agrees to discharge.  Patient goals were met. Patient is being  discharged due to meeting the stated rehab goals.  ?????    Allyson Sabal [place], [place] 09/13/18 1:02 PM

## 2018-09-20 ENCOUNTER — Other Ambulatory Visit: Payer: Self-pay | Admitting: Cardiology

## 2018-09-20 DIAGNOSIS — I890 Lymphedema, not elsewhere classified: Secondary | ICD-10-CM | POA: Diagnosis not present

## 2018-09-20 DIAGNOSIS — I5022 Chronic systolic (congestive) heart failure: Secondary | ICD-10-CM

## 2018-09-20 DIAGNOSIS — I447 Left bundle-branch block, unspecified: Secondary | ICD-10-CM

## 2018-09-20 DIAGNOSIS — R059 Cough, unspecified: Secondary | ICD-10-CM

## 2018-09-20 DIAGNOSIS — I2510 Atherosclerotic heart disease of native coronary artery without angina pectoris: Secondary | ICD-10-CM

## 2018-09-20 DIAGNOSIS — I428 Other cardiomyopathies: Secondary | ICD-10-CM

## 2018-09-20 DIAGNOSIS — I519 Heart disease, unspecified: Secondary | ICD-10-CM

## 2018-09-20 DIAGNOSIS — R05 Cough: Secondary | ICD-10-CM

## 2018-10-03 DIAGNOSIS — L570 Actinic keratosis: Secondary | ICD-10-CM | POA: Diagnosis not present

## 2018-10-03 DIAGNOSIS — C44319 Basal cell carcinoma of skin of other parts of face: Secondary | ICD-10-CM | POA: Diagnosis not present

## 2018-10-03 DIAGNOSIS — L821 Other seborrheic keratosis: Secondary | ICD-10-CM | POA: Diagnosis not present

## 2018-10-07 ENCOUNTER — Other Ambulatory Visit: Payer: Self-pay | Admitting: Cardiology

## 2018-10-07 DIAGNOSIS — R05 Cough: Secondary | ICD-10-CM

## 2018-10-07 DIAGNOSIS — I428 Other cardiomyopathies: Secondary | ICD-10-CM

## 2018-10-07 DIAGNOSIS — E785 Hyperlipidemia, unspecified: Secondary | ICD-10-CM

## 2018-10-07 DIAGNOSIS — I2510 Atherosclerotic heart disease of native coronary artery without angina pectoris: Secondary | ICD-10-CM

## 2018-10-07 DIAGNOSIS — I519 Heart disease, unspecified: Secondary | ICD-10-CM

## 2018-10-07 DIAGNOSIS — I447 Left bundle-branch block, unspecified: Secondary | ICD-10-CM

## 2018-10-07 DIAGNOSIS — R059 Cough, unspecified: Secondary | ICD-10-CM

## 2018-10-07 DIAGNOSIS — I5022 Chronic systolic (congestive) heart failure: Secondary | ICD-10-CM

## 2018-10-20 DIAGNOSIS — I890 Lymphedema, not elsewhere classified: Secondary | ICD-10-CM | POA: Diagnosis not present

## 2018-10-22 LAB — CUP PACEART REMOTE DEVICE CHECK
Battery Voltage: 2.95 V
Brady Statistic AP VS Percent: 1 %
Brady Statistic AS VP Percent: 84 %
Brady Statistic RA Percent Paced: 1 %
HighPow Impedance: 73 Ohm
HighPow Impedance: 73 Ohm
Implantable Lead Implant Date: 20161011
Implantable Lead Location: 753860
Implantable Lead Location: 753860
Lead Channel Impedance Value: 380 Ohm
Lead Channel Impedance Value: 590 Ohm
Lead Channel Pacing Threshold Amplitude: 0.75 V
Lead Channel Pacing Threshold Amplitude: 0.875 V
Lead Channel Pacing Threshold Pulse Width: 0.5 ms
Lead Channel Pacing Threshold Pulse Width: 0.8 ms
Lead Channel Sensing Intrinsic Amplitude: 9.4 mV
Lead Channel Setting Pacing Amplitude: 2 V
Lead Channel Setting Pacing Pulse Width: 0.5 ms
Lead Channel Setting Pacing Pulse Width: 0.8 ms
Lead Channel Setting Sensing Sensitivity: 0.5 mV
MDC IDC LEAD IMPLANT DT: 20161011
MDC IDC LEAD IMPLANT DT: 20161011
MDC IDC LEAD LOCATION: 753858
MDC IDC MSMT BATTERY REMAINING LONGEVITY: 49 mo
MDC IDC MSMT BATTERY REMAINING PERCENTAGE: 59 %
MDC IDC MSMT LEADCHNL RA PACING THRESHOLD AMPLITUDE: 0.75 V
MDC IDC MSMT LEADCHNL RA PACING THRESHOLD PULSEWIDTH: 0.5 ms
MDC IDC MSMT LEADCHNL RA SENSING INTR AMPL: 2.4 mV
MDC IDC MSMT LEADCHNL RV IMPEDANCE VALUE: 340 Ohm
MDC IDC PG IMPLANT DT: 20161011
MDC IDC PG SERIAL: 7276664
MDC IDC SESS DTM: 20191028122016
MDC IDC SET LEADCHNL LV PACING AMPLITUDE: 1.5 V
MDC IDC SET LEADCHNL RV PACING AMPLITUDE: 2 V
MDC IDC STAT BRADY AP VP PERCENT: 4 %
MDC IDC STAT BRADY AS VS PERCENT: 6.6 %

## 2018-10-31 DIAGNOSIS — G4733 Obstructive sleep apnea (adult) (pediatric): Secondary | ICD-10-CM | POA: Diagnosis not present

## 2018-11-19 ENCOUNTER — Ambulatory Visit (INDEPENDENT_AMBULATORY_CARE_PROVIDER_SITE_OTHER): Payer: Medicare HMO

## 2018-11-19 DIAGNOSIS — I428 Other cardiomyopathies: Secondary | ICD-10-CM

## 2018-11-19 DIAGNOSIS — I5022 Chronic systolic (congestive) heart failure: Secondary | ICD-10-CM

## 2018-11-20 DIAGNOSIS — I890 Lymphedema, not elsewhere classified: Secondary | ICD-10-CM | POA: Diagnosis not present

## 2018-11-21 NOTE — Progress Notes (Signed)
Remote ICD transmission.   

## 2018-11-23 LAB — CUP PACEART REMOTE DEVICE CHECK
Brady Statistic AP VP Percent: 6.7 %
Brady Statistic AP VS Percent: 1 %
Brady Statistic AS VP Percent: 84 %
Brady Statistic AS VS Percent: 4.2 %
Date Time Interrogation Session: 20200126073433
HIGH POWER IMPEDANCE MEASURED VALUE: 84 Ohm
HighPow Impedance: 84 Ohm
Implantable Lead Implant Date: 20161011
Implantable Lead Implant Date: 20161011
Implantable Lead Location: 753858
Implantable Pulse Generator Implant Date: 20161011
Lead Channel Impedance Value: 340 Ohm
Lead Channel Pacing Threshold Amplitude: 0.5 V
Lead Channel Pacing Threshold Pulse Width: 0.5 ms
Lead Channel Pacing Threshold Pulse Width: 0.8 ms
Lead Channel Sensing Intrinsic Amplitude: 2.2 mV
Lead Channel Setting Pacing Amplitude: 2 V
Lead Channel Setting Pacing Amplitude: 2 V
Lead Channel Setting Pacing Pulse Width: 0.8 ms
MDC IDC LEAD IMPLANT DT: 20161011
MDC IDC LEAD LOCATION: 753860
MDC IDC LEAD LOCATION: 753860
MDC IDC MSMT BATTERY REMAINING LONGEVITY: 47 mo
MDC IDC MSMT BATTERY REMAINING PERCENTAGE: 56 %
MDC IDC MSMT BATTERY VOLTAGE: 2.95 V
MDC IDC MSMT LEADCHNL LV IMPEDANCE VALUE: 590 Ohm
MDC IDC MSMT LEADCHNL LV PACING THRESHOLD AMPLITUDE: 0.75 V
MDC IDC MSMT LEADCHNL RA IMPEDANCE VALUE: 380 Ohm
MDC IDC MSMT LEADCHNL RA PACING THRESHOLD PULSEWIDTH: 0.5 ms
MDC IDC MSMT LEADCHNL RV PACING THRESHOLD AMPLITUDE: 0.625 V
MDC IDC MSMT LEADCHNL RV SENSING INTR AMPL: 9.4 mV
MDC IDC SET LEADCHNL LV PACING AMPLITUDE: 1.5 V
MDC IDC SET LEADCHNL RV PACING PULSEWIDTH: 0.5 ms
MDC IDC SET LEADCHNL RV SENSING SENSITIVITY: 0.5 mV
MDC IDC STAT BRADY RA PERCENT PACED: 2.6 %
Pulse Gen Serial Number: 7276664

## 2018-12-21 DIAGNOSIS — I890 Lymphedema, not elsewhere classified: Secondary | ICD-10-CM | POA: Diagnosis not present

## 2019-01-19 DIAGNOSIS — I890 Lymphedema, not elsewhere classified: Secondary | ICD-10-CM | POA: Diagnosis not present

## 2019-01-29 ENCOUNTER — Encounter: Payer: Self-pay | Admitting: *Deleted

## 2019-01-29 ENCOUNTER — Telehealth: Payer: Self-pay | Admitting: *Deleted

## 2019-01-29 NOTE — Telephone Encounter (Signed)
Pt is [name];heduled for a Virtual Video Visit with Dr Meda Coffee for tomorrow 01/30/19 at [place].  Video virtual visit will be done by Dr Meda Coffee through doximity.  Pt was instructed that when its time for his visit with Dr Meda Coffee, he will get a text from her, and he will need to click the blue hyperlink button, which will then connect him live video visit with her.  Informed the pt that he will get a call from the rooming CMA at around 0920, and she will ask him for his BP/HR and weight, then do med reconciliation, and update allergies.  After that the pt was informed that's when Dr Meda Coffee will send him a text to join her meeting.  The consent to treat the pt was sent to his active mychart account, and he was instructed to go and read this now.  Pt verbalized understanding and agrees with this plan.  Pt more than gracious for all the assistance provided.      Virtual Visit Pre-Appointment Phone Call  Steps For Call:  1. Confirm consent - "In the setting of the current Covid19 crisis, you are scheduled for a ( video) visit with your with Dr. Meda Coffee on 01/30/19 at 9:40 am. To participate in this visit, we must obtain consent.  If you'd like, I can send this to your mychart (if signed up) or email for you to review.  Otherwise, I can obtain your verbal consent now.  All virtual visits are billed to your insurance company just like a normal visit would be.  By agreeing to a virtual visit, we'd like you to understand that the technology does not allow for your provider to perform an examination, and thus may limit your provider's ability to fully assess your condition.  Finally, though the technology is pretty good, we cannot assure that it will always work on either your or our end, and in the setting of a video visit, we may have to convert it to a phone-only visit.  In either situation, we cannot ensure that we have a secure connection.  Are you willing to proceed?"  1. Give patient instructions for doximity to  smartphone and Dr. Meda Coffee will text him when time to join the video visit.  2. Advise patient to be prepared with any vital sign or heart rhythm information, their current medicines, and a piece of paper and pen handy for any instructions they may receive the day of their visit  3. Inform patient they will receive a phone call 15 minutes prior to their appointment time (may be from unknown caller ID) so they should be prepared to answer  4. Confirm that appointment type is correct in Epic appointment notes (video visit with Dr. Meda Coffee on 4/8 at 9:40 am)    TELEPHONE CALL NOTE  NATURE VOGELSANG has been deemed a candidate for a follow-up tele-health visit to limit community exposure during the Covid-19 pandemic. I spoke with the patient via phone to ensure availability of phone/video source, confirm preferred email & phone number, and discuss instructions and expectations.  I reminded MEREK NIU to be prepared with any vital sign and/or heart rhythm information that could potentially be obtained via home monitoring, at the time of his visit. I reminded AMIRE LEAZER to expect a phone call at the time of his visit if his visit.  Did the patient verbally acknowledge consent to treatment?CONSENT SENT TO MYCHART AS WELL AS OBTAINED VERBAL CONSENT FROM THE PT AS WELL.  HE WAS INSTRUCTED TO READ CONSENT MESSAGE THROUGH HIS ACTIVE MYCHART ACCOUNT. Nuala Alpha, LPN 10/29/1094 0:45 PM   CONSENT FOR TELE-HEALTH VISIT - PLEASE REVIEW  I hereby voluntarily request, consent and authorize [facility] and its employed or contracted physicians, physician assistants, nurse practitioners or other licensed health care professionals (the Practitioner), to provide me with telemedicine health care services (the "Services") as deemed necessary by the treating Practitioner. I acknowledge and consent to receive the Services by the Practitioner via telemedicine. I understand that the telemedicine visit will  involve communicating with the Practitioner through live audiovisual communication technology and the disclosure of certain medical information by electronic transmission. I acknowledge that I have been given the opportunity to request an in-person assessment or other available alternative prior to the telemedicine visit and am voluntarily participating in the telemedicine visit.  I understand that I have the right to withhold or withdraw my consent to the use of telemedicine in the course of my care at any time, without affecting my right to future care or treatment, and that the Practitioner or I may terminate the telemedicine visit at any time. I understand that I have the right to inspect all information obtained and/or recorded in the course of the telemedicine visit and may receive copies of available information for a reasonable fee.  I understand that some of the potential risks of receiving the Services via telemedicine include:  Marland Kitchen Delay or interruption in medical evaluation due to technological equipment failure or disruption; . Information transmitted may not be sufficient (e.g. poor resolution of images) to allow for appropriate medical decision making by the Practitioner; and/or  . In rare instances, security protocols could fail, causing a breach of personal health information.  Furthermore, I acknowledge that it is my responsibility to provide information about my medical history, conditions and care that is complete and accurate to the best of my ability. I acknowledge that Practitioner's advice, recommendations, and/or decision may be based on factors not within their control, such as incomplete or inaccurate data provided by me or distortions of diagnostic images or specimens that may result from electronic transmissions. I understand that the practice of medicine is not an exact science and that Practitioner makes no warranties or guarantees regarding treatment outcomes. I acknowledge that  I will receive a copy of this consent concurrently upon execution via email to the email address I last provided but may also request a printed copy by calling the office of [facility].    I understand that my insurance will be billed for this visit.   I have read or had this consent read to me. . I understand the contents of this consent, which adequately explains the benefits and risks of the Services being provided via telemedicine.  . I have been provided ample opportunity to ask questions regarding this consent and the Services and have had my questions answered to my satisfaction. . I give my informed consent for the services to be provided through the use of telemedicine in my medical care  By participating in this telemedicine visit I agree to the above.

## 2019-01-30 ENCOUNTER — Encounter: Payer: Self-pay | Admitting: *Deleted

## 2019-01-30 ENCOUNTER — Encounter: Payer: Self-pay | Admitting: Cardiology

## 2019-01-30 ENCOUNTER — Other Ambulatory Visit: Payer: Self-pay

## 2019-01-30 ENCOUNTER — Telehealth (INDEPENDENT_AMBULATORY_CARE_PROVIDER_SITE_OTHER): Payer: Medicare HMO | Admitting: Cardiology

## 2019-01-30 DIAGNOSIS — I428 Other cardiomyopathies: Secondary | ICD-10-CM

## 2019-01-30 DIAGNOSIS — I447 Left bundle-branch block, unspecified: Secondary | ICD-10-CM | POA: Diagnosis not present

## 2019-01-30 DIAGNOSIS — E785 Hyperlipidemia, unspecified: Secondary | ICD-10-CM | POA: Diagnosis not present

## 2019-01-30 DIAGNOSIS — I5022 Chronic systolic (congestive) heart failure: Secondary | ICD-10-CM | POA: Diagnosis not present

## 2019-01-30 DIAGNOSIS — Z79899 Other long term (current) drug therapy: Secondary | ICD-10-CM

## 2019-01-30 DIAGNOSIS — R059 Cough, unspecified: Secondary | ICD-10-CM

## 2019-01-30 DIAGNOSIS — R05 Cough: Secondary | ICD-10-CM

## 2019-01-30 DIAGNOSIS — I2510 Atherosclerotic heart disease of native coronary artery without angina pectoris: Secondary | ICD-10-CM

## 2019-01-30 DIAGNOSIS — Z95810 Presence of automatic (implantable) cardiac defibrillator: Secondary | ICD-10-CM

## 2019-01-30 DIAGNOSIS — I519 Heart disease, unspecified: Secondary | ICD-10-CM

## 2019-01-30 MED ORDER — SPIRONOLACTONE 25 MG PO TABS: 12.5000 mg | ORAL_TABLET | Freq: Every day | ORAL | 4 refills | Status: DC

## 2019-01-30 MED ORDER — FUROSEMIDE 20 MG PO TABS: ORAL_TABLET | ORAL | 0 refills | Status: DC

## 2019-01-30 MED ORDER — LOSARTAN POTASSIUM 25 MG PO TABS: ORAL_TABLET | ORAL | 4 refills | Status: DC

## 2019-01-30 NOTE — Patient Instructions (Signed)
Medication Instructions:   INCREASE YOUR LASIX TO TAKING 40 MG TWICE DAILY FOR 3 DAYS ONLY, THEN GO BACK TO TAKING LASIX 20 MG TWICE DAILY THEREAFTER.   If you need a refill on your cardiac medications before your next appointment, please call your pharmacy.     Lab work:  A FEW DAYS PRIOR TO YOUR 6 MONTH FOLLOW-UP APPOINTMENT WITH DR NELSON TO CHECK--CMET, CBC W DIFF, TSH, AND LIPIDS-PLEASE COME FASTING TO THIS LAB APPOINTMENT.  If you have labs (blood work) drawn today and your tests are completely normal, you will receive your results only by: Marland Kitchen MyChart Message (if you have MyChart) OR . A paper copy in the mail If you have any lab test that is abnormal or we need to change your treatment, we will call you to review the results.    Follow-Up: At [facility], you and your health needs are our priority.  As part of our continuing mission to provide you with exceptional heart care, we have created designated Provider Care Teams.  These Care Teams include your primary Cardiologist (physician) and Advanced Practice Providers (APPs -  Physician Assistants and Nurse Practitioners) who all work together to provide you with the care you need, when you need it.  Your physician wants you to follow-up IR:JJOA DR NELSON IN 6 MONTHS You will receive a reminder letter in the mail two months in advance. If you don't receive a letter, please call our office to schedule the follow-up appointment. YOU WILL NEED TO HAVE YOUR LABS DONE A COUPLE DAYS PRIOR TO THIS OFFICE VISIT.

## 2019-01-30 NOTE — Progress Notes (Signed)
Virtual Visit via Video Note   This visit type was conducted due to national recommendations for restrictions regarding the COVID-19 Pandemic (e.g. social distancing) in an effort to limit this patient's exposure and mitigate transmission in our community.  Due to his co-morbid illnesses, this patient is at least at moderate risk for complications without adequate follow up.  This format is felt to be most appropriate for this patient at this time.  All issues noted in this document were discussed and addressed.  A limited physical exam was performed with this format.  Please refer to the patient's chart for his consent to telehealth for [name].   Evaluation Performed:  Follow-up visit  Date:  01/30/2019   ID:  Marcus, Christensen [birth year], MRN [id number]  Patient Location: Home  Provider Location: Home  PCP:  Orpah Melter, MD  Primary Cardiologist: Meda Coffee Electrophysiologist: Allred  Chief Complaint:  LE edema  History of Present Illness:    [name] is a 78 y.o. male who presents via audio/video conferencing for a telehealth visit today.   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  He reports that he began having symptoms of SOB and DOE 12/15.  Echo 11/11/14 which revealed EF 25% with moderate MR and moderate LA enlargement. H/o CAD, S/P PCI 6/16.  Despite revascularization, echo 07/01/15 reveals EF 20-25% -> s/P BiV ICD implantation in 07/2016 with improvement of his symptoms, having more energy. 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  He underwent lymphedema therapy at the outpatient rehab center of [facility] with significant improvement in his bilateral lymphedema.    01/30/2019 - the patient has been doing well, he has been isolating himself at home, as a result he cant  go to his regular 3x/week exercises and has noticed mild worsening of LE edema and 3 lbs weight gain. No orthopnea or PND, no CP, DOE other than on moderate exertion. Occasional nocturnal palpitations, but no ICD firing.  The patient does not have symptoms concerning for COVID-19 infection (fever, chills, cough, or new shortness of breath).    Past Medical History:  Diagnosis Date  . Acid reflux   . CAD (coronary artery disease)    PCI to Circ with DES on 04/06/15  . CHF (congestive heart failure) ([place])   . Degenerative disc disease, thoracic   . Hyperlipidemia   . Hyperplastic colon polyp   . LBBB (left bundle branch block)   . Merkel cell cancer ([place]) 05/2005; 07-2005   s/p gluteal resection, lymph node dissection, chemo/ radiation  . Nonischemic cardiomyopathy ([place])    a. s/p STJ CRTD 07/2015  . Obesity   . Paroxysmal atrial fibrillation (HCC)    a. identified on device interrogation. All episodes <3 hours  . Ventricular fibrillation ([place])    a. appropriate ICD therapy 02/2017   Past Surgical History:  Procedure Laterality Date  . CARDIAC CATHETERIZATION N/A 04/06/2015   Procedure: Right/Left Heart Cath and Coronary Angiography;  Surgeon: Larey Dresser, MD;  Location: [place] [facility];  Service: Cardiovascular;  Laterality: N/A;  . CARDIAC CATHETERIZATION N/A 04/06/2015   Procedure: Coronary Stent Intervention;  Surgeon: Peter M Martinique, MD;  Location: [place] [facility];  Service: Cardiovascular;  Laterality: N/A;  . CATARACT EXTRACTION W/ INTRAOCULAR LENS  IMPLANT, BILATERAL Bilateral  11/2014  . COLONOSCOPY  2009  . EP IMPLANTABLE DEVICE N/A 08/04/2015   STJ CRTD implanted by Dr Rayann Heman for primary prevention/CHF  . KNEE CARTILAGE SURGERY Left 1960  . SKIN CANCER EXCISION  05/2005; 07/2005   excision of merkel cell  . TONSILLECTOMY       Current Meds  Medication Sig  . acetaminophen (TYLENOL) 325 MG tablet Take 650 mg by mouth every 6 (six) hours as needed for mild pain or  moderate pain.  Marland Kitchen aspirin 81 MG tablet Take 81 mg by mouth daily.  Marland Kitchen atorvastatin (LIPITOR) 80 MG tablet Take 1 tablet (80 mg total) by mouth daily. Please keep upcoming appt in October with Dr. Meda Coffee for future refills. Thank you  . carvedilol (COREG) 12.5 MG tablet Take 1 tablet (12.5 mg total) by mouth 2 (two) times daily with a meal.  . fluorouracil (EFUDEX) 5 % cream Apply 5 application topically daily as needed (Patches on face).   . furosemide (LASIX) 20 MG tablet TAKE 1 TAB BY MOUTH 2 TIMES DAILY. KEEP APPT IN OCT. NO REFILLS UNTIL APPT  . ibuprofen (ADVIL,MOTRIN) 200 MG tablet Take 200 mg by mouth 2 (two) times daily as needed (pain).  Marland Kitchen levothyroxine (SYNTHROID, LEVOTHROID) 50 MCG tablet Take 50 mcg by mouth daily.  Marland Kitchen losartan (COZAAR) 25 MG tablet TAKE 1 TAB BY MOUTH DAILY. KEEP APPT IN OCT. NO REFILLS UNTIL APPT  . POTASSIUM PO Take 550 mg by mouth daily as needed (Potassium supplement).   Marland Kitchen spironolactone (ALDACTONE) 25 MG tablet Take 0.5 tablets (12.5 mg total) by mouth daily.     Allergies:   Tape   Social History   Tobacco Use  . Smoking status: Former Smoker    Packs/day: 0.25    Years: 15.00    Pack years: 3.75    Types: Cigarettes    Last attempt to quit: 10/25/1983    Years since quitting: 35.2  . Smokeless tobacco: Never Used  Substance Use Topics  . Alcohol use: Yes    Alcohol/week: 0.0 standard drinks    Comment: 08/04/2015 "I doubt if I average a glass of wine and 1 beer q week"  . Drug use: No     Family Hx: The patient's family history includes Cancer in his father and mother; Lupus in his mother; Stroke in his sister; Thyroid disease in his sister. There is no history of Heart attack or Hypertension.  ROS:   Please see the history of present illness.    All other systems reviewed and are negative.   Prior CV studies:   The following studies were reviewed today:  TTE: 02/2017 - Left ventricle: Diffuse hypokinesis with abnormal septal motion.   The  cavity size was moderately dilated. Wall thickness was   increased in a pattern of moderate LVH. Systolic function was   moderately reduced. The estimated ejection fraction was in the   range of 35% to 40%. Doppler parameters are consistent with both   elevated ventricular end-diastolic filling pressure and elevated   left atrial filling pressure. - Mitral valve: There was mild regurgitation. - Left atrium: The atrium was moderately dilated. - Atrial septum: No defect or patent foramen ovale was identified. - Pulmonary arteries: PA peak pressure: 33 mm Hg (S).  Labs/Other Tests and Data Reviewed:    EKG:  No ECG reviewed.  Recent Labs: 08/10/2018: ALT 19; BUN 17; Creatinine, Ser 1.16; Hemoglobin 13.8; Platelets 137; Potassium 4.0; Sodium 143; TSH 2.910   Recent  Lipid Panel Lab Results  Component Value Date/Time   CHOL 92 (L) 08/10/2018 09:36 AM   TRIG 55 08/10/2018 09:36 AM   HDL 46 08/10/2018 09:36 AM   CHOLHDL 2.0 08/10/2018 09:36 AM   CHOLHDL 1.9 05/04/2016 10:01 AM   LDLCALC 35 08/10/2018 09:36 AM    Wt Readings from Last 3 Encounters:  01/30/19 266 lb (120.7 kg)  08/24/18 276 lb 12.8 oz (125.6 kg)  08/10/18 275 lb (124.7 kg)     Objective:    Vital Signs:  BP 119/78   Pulse 70   Ht 5\' 11"  (1.803 m)   Wt 266 lb (120.7 kg)   BMI 37.10 kg/m    Well nourished, well developed male in no acute distress.   ASSESSMENT & PLAN:    1. Nonischemic CMP, s/p BiV ICD - most recent  LVEF 35-40% in 2018 - mild fluid overload, mostly sec to inactivity - increase lasix to 40 mg PO BID x 3 days, than back to 20 mg PO BID  - start isolated walks in the neighborhood - normal BiV ICD function  2.  Chronic systolic dysfunction - as above  3. PVCs/VF Well controlled, no palpitations If he has future arrhythmias, would start sotalol, Could also consider ablation -as per Dr. Jackalyn Lombard note  4. Obesity Motivated to weight, regular gym visits until covid pandemia  5. CAD -    LHC showed 90% stenosis of LCx with PCI and stenting in 03/2015, of Plavix secondary to significant bruising, he is asymptomatic.    6. LBBB - s/p BiV ICD implantation   7. Hyperlipidemia - on atorvastatin, well-tolerated  Current medicines are reviewed at length with the patient today.   The patient does not have concerns regarding his medicines.  The following changes were made today:  none   COVID-19 Education: The signs and symptoms of COVID-19 were discussed with the patient and how to seek care for testing (follow up with PCP or arrange E-visit).  The importance of social distancing was discussed today.  Time:   Today, I have spent 30 minutes with the patient with telehealth technology discussing the above problems.     Medication Adjustments/Labs and Tests Ordered: Current medicines are reviewed at length with the patient today.  Concerns regarding medicines are outlined above.  Tests Ordered: No orders of the defined types were placed in this encounter.  Medication Changes: No orders of the defined types were placed in this encounter.   Disposition:  Follow up in 6 month(s), lipids, CBC, CMP prior to that appointment.  Signed, Ena Dawley, MD  01/30/2019 9:55 AM    [place]

## 2019-02-07 DIAGNOSIS — I4901 Ventricular fibrillation: Secondary | ICD-10-CM | POA: Diagnosis not present

## 2019-02-07 DIAGNOSIS — I10 Essential (primary) hypertension: Secondary | ICD-10-CM | POA: Diagnosis not present

## 2019-02-07 DIAGNOSIS — I2510 Atherosclerotic heart disease of native coronary artery without angina pectoris: Secondary | ICD-10-CM | POA: Diagnosis not present

## 2019-02-07 DIAGNOSIS — I509 Heart failure, unspecified: Secondary | ICD-10-CM | POA: Diagnosis not present

## 2019-02-07 DIAGNOSIS — E7800 Pure hypercholesterolemia, unspecified: Secondary | ICD-10-CM | POA: Diagnosis not present

## 2019-02-07 DIAGNOSIS — E039 Hypothyroidism, unspecified: Secondary | ICD-10-CM | POA: Diagnosis not present

## 2019-02-15 ENCOUNTER — Ambulatory Visit: Payer: Medicare HMO | Admitting: Cardiology

## 2019-02-18 ENCOUNTER — Other Ambulatory Visit: Payer: Self-pay

## 2019-02-18 ENCOUNTER — Ambulatory Visit (INDEPENDENT_AMBULATORY_CARE_PROVIDER_SITE_OTHER): Payer: Medicare HMO | Admitting: *Deleted

## 2019-02-18 DIAGNOSIS — I5022 Chronic systolic (congestive) heart failure: Secondary | ICD-10-CM | POA: Diagnosis not present

## 2019-02-18 DIAGNOSIS — I447 Left bundle-branch block, unspecified: Secondary | ICD-10-CM

## 2019-02-19 DIAGNOSIS — I890 Lymphedema, not elsewhere classified: Secondary | ICD-10-CM | POA: Diagnosis not present

## 2019-02-19 LAB — CUP PACEART REMOTE DEVICE CHECK
Battery Remaining Longevity: 43 mo
Battery Remaining Percentage: 53 %
Battery Voltage: 2.95 V
Brady Statistic AP VP Percent: 8.7 %
Brady Statistic AP VS Percent: 1 %
Brady Statistic AS VP Percent: 81 %
Brady Statistic AS VS Percent: 4 %
Brady Statistic RA Percent Paced: 3.8 %
Date Time Interrogation Session: 20200427080354
HighPow Impedance: 74 Ohm
HighPow Impedance: 74 Ohm
Implantable Lead Implant Date: 20161011
Implantable Lead Implant Date: 20161011
Implantable Lead Implant Date: 20161011
Implantable Lead Location: 753858
Implantable Lead Location: 753860
Implantable Lead Location: 753860
Implantable Pulse Generator Implant Date: 20161011
Lead Channel Impedance Value: 340 Ohm
Lead Channel Impedance Value: 360 Ohm
Lead Channel Impedance Value: 550 Ohm
Lead Channel Pacing Threshold Amplitude: 0.5 V
Lead Channel Pacing Threshold Amplitude: 0.625 V
Lead Channel Pacing Threshold Amplitude: 0.75 V
Lead Channel Pacing Threshold Pulse Width: 0.5 ms
Lead Channel Pacing Threshold Pulse Width: 0.5 ms
Lead Channel Pacing Threshold Pulse Width: 0.8 ms
Lead Channel Sensing Intrinsic Amplitude: 2.5 mV
Lead Channel Sensing Intrinsic Amplitude: 9.8 mV
Lead Channel Setting Pacing Amplitude: 1.5 V
Lead Channel Setting Pacing Amplitude: 2 V
Lead Channel Setting Pacing Amplitude: 2 V
Lead Channel Setting Pacing Pulse Width: 0.5 ms
Lead Channel Setting Pacing Pulse Width: 0.8 ms
Lead Channel Setting Sensing Sensitivity: 0.5 mV
Pulse Gen Serial Number: 7276664

## 2019-02-26 NOTE — Progress Notes (Signed)
Remote ICD transmission.   

## 2019-03-21 DIAGNOSIS — I890 Lymphedema, not elsewhere classified: Secondary | ICD-10-CM | POA: Diagnosis not present

## 2019-04-03 DIAGNOSIS — Z08 Encounter for follow-up examination after completed treatment for malignant neoplasm: Secondary | ICD-10-CM | POA: Diagnosis not present

## 2019-04-03 DIAGNOSIS — Z85828 Personal history of other malignant neoplasm of skin: Secondary | ICD-10-CM | POA: Diagnosis not present

## 2019-04-03 DIAGNOSIS — L570 Actinic keratosis: Secondary | ICD-10-CM | POA: Diagnosis not present

## 2019-04-03 DIAGNOSIS — L820 Inflamed seborrheic keratosis: Secondary | ICD-10-CM | POA: Diagnosis not present

## 2019-04-03 DIAGNOSIS — L821 Other seborrheic keratosis: Secondary | ICD-10-CM | POA: Diagnosis not present

## 2019-04-05 DIAGNOSIS — G4733 Obstructive sleep apnea (adult) (pediatric): Secondary | ICD-10-CM | POA: Diagnosis not present

## 2019-04-11 DIAGNOSIS — G4733 Obstructive sleep apnea (adult) (pediatric): Secondary | ICD-10-CM | POA: Diagnosis not present

## 2019-05-20 ENCOUNTER — Ambulatory Visit (INDEPENDENT_AMBULATORY_CARE_PROVIDER_SITE_OTHER): Payer: Medicare HMO | Admitting: *Deleted

## 2019-05-20 DIAGNOSIS — I5022 Chronic systolic (congestive) heart failure: Secondary | ICD-10-CM | POA: Diagnosis not present

## 2019-05-20 DIAGNOSIS — I428 Other cardiomyopathies: Secondary | ICD-10-CM

## 2019-05-21 ENCOUNTER — Telehealth: Payer: Self-pay

## 2019-05-21 NOTE — Telephone Encounter (Signed)
Spoke with patient to remind of missed remote transmission 

## 2019-05-22 LAB — CUP PACEART REMOTE DEVICE CHECK
Battery Remaining Longevity: 41 mo
Battery Remaining Percentage: 50 %
Battery Voltage: 2.93 V
Brady Statistic AP VP Percent: 9.1 %
Brady Statistic AP VS Percent: 1 %
Brady Statistic AS VP Percent: 81 %
Brady Statistic AS VS Percent: 4.3 %
Brady Statistic RA Percent Paced: 4.2 %
Date Time Interrogation Session: 20200728143037
HighPow Impedance: 79 Ohm
HighPow Impedance: 79 Ohm
Implantable Lead Implant Date: 20161011
Implantable Lead Implant Date: 20161011
Implantable Lead Implant Date: 20161011
Implantable Lead Location: 753858
Implantable Lead Location: 753860
Implantable Lead Location: 753860
Implantable Pulse Generator Implant Date: 20161011
Lead Channel Impedance Value: 340 Ohm
Lead Channel Impedance Value: 360 Ohm
Lead Channel Impedance Value: 600 Ohm
Lead Channel Pacing Threshold Amplitude: 0.5 V
Lead Channel Pacing Threshold Amplitude: 0.75 V
Lead Channel Pacing Threshold Amplitude: 0.75 V
Lead Channel Pacing Threshold Pulse Width: 0.5 ms
Lead Channel Pacing Threshold Pulse Width: 0.5 ms
Lead Channel Pacing Threshold Pulse Width: 0.8 ms
Lead Channel Sensing Intrinsic Amplitude: 11.9 mV
Lead Channel Sensing Intrinsic Amplitude: 2.2 mV
Lead Channel Setting Pacing Amplitude: 1.5 V
Lead Channel Setting Pacing Amplitude: 2 V
Lead Channel Setting Pacing Amplitude: 2 V
Lead Channel Setting Pacing Pulse Width: 0.5 ms
Lead Channel Setting Pacing Pulse Width: 0.8 ms
Lead Channel Setting Sensing Sensitivity: 0.5 mV
Pulse Gen Serial Number: 7276664

## 2019-06-05 NOTE — Progress Notes (Signed)
Remote ICD transmission.   

## 2019-06-27 DIAGNOSIS — R69 Illness, unspecified: Secondary | ICD-10-CM | POA: Diagnosis not present

## 2019-07-29 DIAGNOSIS — I255 Ischemic cardiomyopathy: Secondary | ICD-10-CM | POA: Insufficient documentation

## 2019-07-29 DIAGNOSIS — I10 Essential (primary) hypertension: Secondary | ICD-10-CM | POA: Insufficient documentation

## 2019-07-29 DIAGNOSIS — I890 Lymphedema, not elsewhere classified: Secondary | ICD-10-CM | POA: Insufficient documentation

## 2019-07-29 NOTE — Progress Notes (Signed)
Cardiology Office Note    Date:  07/30/2019   ID:  Pedrito, Shellhorn 1940/12/29, MRN [id number]  PCP:  Orpah Melter, MD  Cardiologist: Ena Dawley, MD EPS: [name], MD  Chief Complaint  Patient presents with  . Follow-up    History of Present Illness:  [name] is a 78 y.o. male with history of ICM LVEF 35-40% 2018, s/p BiV ICD 2017, CAD DES Cfx 2016 off Plavix due to significant bruising, LBBB,PVC's VF, obesity, HTN,HLD, history of bilateral lymphedema underwent therapy with improvement, Merkel cell cancer S/P chemo and radiation gluteus 2006.Marland Kitchen  Last had telemedicine visit with Dr. Meda Coffee 01/2019 and had mild fluid overload treated with extra lasix for 2 days.   Patient comes in for routine f/u. Needs to lose weight. Trouble walking with knees but does ok on treadmill or bike but reluctant to go back to the gym. No chest pain or swelling, shortness of breath. No ICD discharge.  Past Medical History:  Diagnosis Date  . Acid reflux   . CAD (coronary artery disease)    PCI to Circ with DES on 04/06/15  . CHF (congestive heart failure) ([place])   . Degenerative disc disease, thoracic   . Hyperlipidemia   . Hyperplastic colon polyp   . LBBB (left bundle branch block)   . Merkel cell cancer ([place]) 05/2005; 07-2005   s/p gluteal resection, lymph node dissection, chemo/ radiation  . Nonischemic cardiomyopathy ([place])    a. s/p STJ CRTD 07/2015  . Obesity   . Paroxysmal atrial fibrillation (HCC)    a. identified on device interrogation. All episodes <3 hours  . Ventricular fibrillation ([place])    a. appropriate ICD therapy 02/2017    Past Surgical History:  Procedure Laterality Date  . CARDIAC CATHETERIZATION N/A 04/06/2015   Procedure: Right/Left Heart Cath and Coronary Angiography;  Surgeon: Larey Dresser, MD;  Location: [place] [facility];  Service: Cardiovascular;  Laterality: N/A;  . CARDIAC CATHETERIZATION N/A 04/06/2015   Procedure: Coronary Stent  Intervention;  Surgeon: Peter M Martinique, MD;  Location: [place] [facility];  Service: Cardiovascular;  Laterality: N/A;  . CATARACT EXTRACTION W/ INTRAOCULAR LENS  IMPLANT, BILATERAL Bilateral 11/2014  . COLONOSCOPY  2009  . EP IMPLANTABLE DEVICE N/A 08/04/2015   STJ CRTD implanted by Dr Rayann Heman for primary prevention/CHF  . KNEE CARTILAGE SURGERY Left 1960  . SKIN CANCER EXCISION  05/2005; 07/2005   excision of merkel cell  . TONSILLECTOMY      Current Medications: Current Meds  Medication Sig  . acetaminophen (TYLENOL) 325 MG tablet Take 650 mg by mouth every 6 (six) hours as needed for mild pain or moderate pain.  Marland Kitchen aspirin 81 MG tablet Take 81 mg by mouth daily.  Marland Kitchen atorvastatin (LIPITOR) 80 MG tablet Take 1 tablet (80 mg total) by mouth daily. Please keep upcoming appt in October with Dr. Meda Coffee for future refills. Thank you  . carvedilol (COREG) 12.5 MG tablet Take 1 tablet (12.5 mg total) by mouth 2 (two) times daily with a meal.  . fluorouracil (EFUDEX) 5 % cream Apply 5 application topically daily as needed (Patches on face).   . furosemide (LASIX) 20 MG tablet Take 2 tabs (40 mg total) twice daily for 3 days, then take 1 tab (20 mg total) twice daily thereafter.  Marland Kitchen ibuprofen (ADVIL,MOTRIN) 200 MG tablet Take 200 mg by mouth 2 (two) times daily as needed (pain).  Marland Kitchen levothyroxine (SYNTHROID, LEVOTHROID) 50 MCG tablet  Take 50 mcg by mouth daily.  Marland Kitchen losartan (COZAAR) 25 MG tablet TAKE 1 TAB BY MOUTH DAILY.  Marland Kitchen POTASSIUM PO Take 550 mg by mouth daily as needed (Potassium supplement).   Marland Kitchen spironolactone (ALDACTONE) 25 MG tablet Take 0.5 tablets (12.5 mg total) by mouth daily.     Allergies:   Tape   Social History   Socioeconomic History  . Marital status: Married    Spouse name: bridgette  . Number of children: 2  . Years of education: college  . Highest education level: Not on file  Occupational History  . 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 Financial  resource strain: Not on file  . Food insecurity    Worry: Not on file    Inability: Not on file  . Transportation needs    Medical: Not on file    Non-medical: Not on file  Tobacco Use  . Smoking status: Former Smoker    Packs/day: 0.25    Years: 15.00    Pack years: 3.75    Types: Cigarettes    Quit date: 10/25/1983    Years since quitting: 35.7  . Smokeless tobacco: Never Used  Substance and Sexual Activity  . Alcohol use: Yes    Alcohol/week: 0.0 standard drinks    Comment: 08/04/2015 "I doubt if I average a glass of wine and 1 beer q week"  . Drug use: No  . Sexual activity: Not Currently  Lifestyle  . Physical activity    Days per week: Not on file    Minutes per session: Not on file  . Stress: Not on file  Relationships  . Social Herbalist on phone: Not on file    Gets together: Not on file    Attends religious service: Not on file    Active member of club or organization: Not on file    Attends meetings of clubs or organizations: Not on file    Relationship status: Not on file  Other Topics Concern  . Not on file  Social History Narrative   Pt lives in [place], married x 45 years.  2 grown sons have HTN.   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   ROS:   Please see the history of present illness.    ROS All other systems reviewed and are negative.   PHYSICAL EXAM:   VS:  BP 120/66   Pulse 72   Ht 5\' 11"  (1.803 m)   Wt 274 lb 12.8 oz (124.6 kg)   SpO2 90%   BMI 38.33 kg/m   Physical Exam  GEN: Obese, in no acute distress  Neck: no JVD, carotid bruits, or masses Cardiac:RRR; no murmurs, rubs, or gallops  Respiratory:  clear to auscultation bilaterally, normal work of breathing GI: soft, nontender, nondistended, + BS Ext: trace edema on right without cyanosis,  clubbing, or , Good distal pulses bilaterally Neuro:  Alert and Oriented x 3,  Psych: euthymic mood, full affect  Wt Readings from Last 3 Encounters:  07/30/19 274 lb 12.8 oz (124.6 kg)  01/30/19 266 lb (120.7 kg)  08/24/18 276 lb 12.8 oz (125.6 kg)      Studies/Labs Reviewed:   EKG:  EKG is  ordered today.  The ekg ordered today demonstrates Vpaced  Recent Labs: 08/10/2018: ALT 19; BUN 17; Creatinine, Ser 1.16; Hemoglobin 13.8; Platelets 137; Potassium 4.0; Sodium 143; TSH 2.910   Lipid Panel    Component Value Date/Time   CHOL 92 (L) 08/10/2018 0936   TRIG 55 08/10/2018 0936   HDL 46 08/10/2018 0936   CHOLHDL 2.0 08/10/2018 0936   CHOLHDL 1.9 05/04/2016 1001   VLDL 10 05/04/2016 1001   LDLCALC 35 08/10/2018 0936    Additional studies/ records that were reviewed today include:   TTE: 02/2017 - Left ventricle: Diffuse hypokinesis with abnormal septal motion.   The cavity size was moderately dilated. Wall thickness was   increased in a pattern of moderate LVH. Systolic function was   moderately reduced. The estimated ejection fraction was in the   range of 35% to 40%. Doppler parameters are consistent with both   elevated ventricular end-diastolic filling pressure and elevated   left atrial filling pressure. - Mitral valve: There was mild regurgitation. - Left atrium: The atrium was moderately dilated. - Atrial septum: No defect or patent foramen ovale was identified. - Pulmonary arteries: PA peak pressure: 33 mm Hg (S).     ASSESSMENT:    1. Chronic systolic CHF (congestive heart failure) ([place])   2. Ischemic cardiomyopathy   3. Biventricular ICD (implantable cardioverter-defibrillator) in place   4. Benign essential HTN   5. Other hyperlipidemia   6. Lymphedema   7. Coronary artery disease involving native coronary artery of native heart without angina pectoris      PLAN:  In order of problems listed above:  ICM LVEF 35-40% echo 2018 well compensated   Chronic systolic CHF compensated. For surveillance labs today.  BiV ICD for VF-if recurrence consider sotalol/ablation per Dr. Rayann Heman. Normal device check 05/21/19. Has appt in Dec.  HTN well controlled  HLD on lipitor. For FLP today-did have yogurt granola and blueberries this am  History of lymphedema treated at lymphedema clinic in past.  CAD DES Cfx 2016 no angina. Exercise and weight loss program recommended.  Medication Adjustments/Labs and Tests Ordered: Current medicines are reviewed at length with the patient today.  Concerns regarding medicines are outlined above.  Medication changes, Labs and Tests ordered today are listed in the Patient Instructions below. Patient Instructions  Medication Instructions:  Your physician recommends that you continue on your current medications as directed. Please refer to the Current Medication list given to you today.  If you need a refill on your cardiac medications before your next appointment, please call your pharmacy.   Lab work: None Ordered  If you have labs (blood work) drawn today and your tests are completely normal, you will receive your results only by: Marland Kitchen MyChart Message (if you have MyChart) OR . A paper copy in the mail If you have any lab test that is abnormal or we need to change your treatment, we will call you to review the results.  Testing/Procedures: None ordered  Follow-Up: Keep VIRTUAL Appointment with Dr. Rayann Heman on 09/25/19 at 2:00 PM  At [facility], you and your health needs are our priority.  As part of our continuing mission to provide you with exceptional heart care, we have created designated Provider Care Teams.  These Care Teams include your primary Cardiologist (physician) and Advanced Practice Providers (APPs -  Physician Assistants and Nurse Practitioners) who all work together to provide you with the care you need, when you need it. . You will need a follow up appointment in 6 months.  Please call  our  office 2 months in advance to schedule this appointment.  You may see Ena Dawley, MD or one of the following Advanced Practice Providers on your designated Care Team:   . Lyda Jester, PA-C . Dayna Dunn, PA-C . Ermalinda Barrios, PA-C  Any Other Special Instructions Will Be Listed Below (If Applicable).  Your physician encouraged you to lose weight for better health.  Your provider recommends that you maintain 150 minutes per week of moderate aerobic activity.      Sumner Boast, PA-C  07/30/2019 8:19 AM    [facility] [address], [place], [place]  [address] Phone: [phone]; Fax: [phone]

## 2019-07-30 ENCOUNTER — Ambulatory Visit: Payer: Medicare HMO | Admitting: Physician Assistant

## 2019-07-30 ENCOUNTER — Other Ambulatory Visit: Payer: Medicare HMO

## 2019-07-30 ENCOUNTER — Encounter: Payer: Self-pay | Admitting: Physician Assistant

## 2019-07-30 ENCOUNTER — Other Ambulatory Visit: Payer: Self-pay

## 2019-07-30 VITALS — BP 120/66 | HR 72 | Ht 71.0 in | Wt 274.8 lb

## 2019-07-30 DIAGNOSIS — I447 Left bundle-branch block, unspecified: Secondary | ICD-10-CM | POA: Diagnosis not present

## 2019-07-30 DIAGNOSIS — I255 Ischemic cardiomyopathy: Secondary | ICD-10-CM

## 2019-07-30 DIAGNOSIS — E785 Hyperlipidemia, unspecified: Secondary | ICD-10-CM | POA: Diagnosis not present

## 2019-07-30 DIAGNOSIS — R059 Cough, unspecified: Secondary | ICD-10-CM

## 2019-07-30 DIAGNOSIS — I10 Essential (primary) hypertension: Secondary | ICD-10-CM | POA: Diagnosis not present

## 2019-07-30 DIAGNOSIS — R05 Cough: Secondary | ICD-10-CM

## 2019-07-30 DIAGNOSIS — E7849 Other hyperlipidemia: Secondary | ICD-10-CM

## 2019-07-30 DIAGNOSIS — I519 Heart disease, unspecified: Secondary | ICD-10-CM | POA: Diagnosis not present

## 2019-07-30 DIAGNOSIS — I2510 Atherosclerotic heart disease of native coronary artery without angina pectoris: Secondary | ICD-10-CM

## 2019-07-30 DIAGNOSIS — I890 Lymphedema, not elsewhere classified: Secondary | ICD-10-CM

## 2019-07-30 DIAGNOSIS — I428 Other cardiomyopathies: Secondary | ICD-10-CM

## 2019-07-30 DIAGNOSIS — Z95810 Presence of automatic (implantable) cardiac defibrillator: Secondary | ICD-10-CM

## 2019-07-30 DIAGNOSIS — I5022 Chronic systolic (congestive) heart failure: Secondary | ICD-10-CM | POA: Diagnosis not present

## 2019-07-30 LAB — COMPREHENSIVE METABOLIC PANEL
ALT: 23 IU/L (ref 0–44)
AST: 23 IU/L (ref 0–40)
Albumin/Globulin Ratio: 2 (ref 1.2–2.2)
Albumin: 4.5 g/dL (ref 3.7–4.7)
Alkaline Phosphatase: 65 IU/L (ref 39–117)
BUN/Creatinine Ratio: 16 (ref 10–24)
BUN: 20 mg/dL (ref 8–27)
Bilirubin Total: 0.7 mg/dL (ref 0.0–1.2)
CO2: 24 mmol/L (ref 20–29)
Calcium: 9.6 mg/dL (ref 8.6–10.2)
Chloride: 101 mmol/L (ref 96–106)
Creatinine, Ser: 1.26 mg/dL (ref 0.76–1.27)
GFR calc Af Amer: 63 mL/min/{1.73_m2} (ref >59)
GFR calc non Af Amer: 54 mL/min/{1.73_m2} — ABNORMAL LOW (ref >59)
Globulin, Total: 2.3 g/dL (ref 1.5–4.5)
Glucose: 100 mg/dL — ABNORMAL HIGH (ref 65–99)
Potassium: 4.1 mmol/L (ref 3.5–5.2)
Sodium: 141 mmol/L (ref 134–144)
Total Protein: 6.8 g/dL (ref 6.0–8.5)

## 2019-07-30 LAB — CBC WITH DIFFERENTIAL/PLATELET
Basophils Absolute: 0 10*3/uL (ref 0.0–0.2)
Basos: 1 %
EOS (ABSOLUTE): 0.1 10*3/uL (ref 0.0–0.4)
Eos: 3 %
Hematocrit: 39 % (ref 37.5–51.0)
Hemoglobin: 13.8 g/dL (ref 13.0–17.7)
Immature Grans (Abs): 0 10*3/uL (ref 0.0–0.1)
Immature Granulocytes: 0 %
Lymphocytes Absolute: 0.7 10*3/uL (ref 0.7–3.1)
Lymphs: 15 %
MCH: 35.2 pg — ABNORMAL HIGH (ref 26.6–33.0)
MCHC: 35.4 g/dL (ref 31.5–35.7)
MCV: 100 fL — ABNORMAL HIGH (ref 79–97)
Monocytes Absolute: 0.6 10*3/uL (ref 0.1–0.9)
Monocytes: 15 %
Neutrophils Absolute: 2.9 10*3/uL (ref 1.4–7.0)
Neutrophils: 66 %
Platelets: 139 10*3/uL — ABNORMAL LOW (ref 150–450)
RBC: 3.92 x10E6/uL — ABNORMAL LOW (ref 4.14–5.80)
RDW: 12.4 % (ref 11.6–15.4)
WBC: 4.4 10*3/uL (ref 3.4–10.8)

## 2019-07-30 LAB — LIPID PANEL
Chol/HDL Ratio: 2 ratio (ref 0.0–5.0)
Cholesterol, Total: 99 mg/dL — ABNORMAL LOW (ref 100–199)
HDL: 49 mg/dL (ref >39)
LDL Chol Calc (NIH): 37 mg/dL (ref 0–99)
Triglycerides: 54 mg/dL (ref 0–149)
VLDL Cholesterol Cal: 13 mg/dL (ref 5–40)

## 2019-07-30 LAB — TSH: TSH: 3.46 u[IU]/mL (ref 0.450–4.500)

## 2019-07-30 NOTE — Patient Instructions (Addendum)
Medication Instructions:  Your physician recommends that you continue on your current medications as directed. Please refer to the Current Medication list given to you today.  If you need a refill on your cardiac medications before your next appointment, please call your pharmacy.   Lab work: None Ordered  If you have labs (blood work) drawn today and your tests are completely normal, you will receive your results only by: Marland Kitchen MyChart Message (if you have MyChart) OR . A paper copy in the mail If you have any lab test that is abnormal or we need to change your treatment, we will call you to review the results.  Testing/Procedures: None ordered  Follow-Up: Keep VIRTUAL Appointment with Dr. Rayann Heman on 09/25/19 at 2:00 PM  At [facility], you and your health needs are our priority.  As part of our continuing mission to provide you with exceptional heart care, we have created designated Provider Care Teams.  These Care Teams include your primary Cardiologist (physician) and Advanced Practice Providers (APPs -  Physician Assistants and Nurse Practitioners) who all work together to provide you with the care you need, when you need it. . You will need a follow up appointment in 6 months.  Please call our office 2 months in advance to schedule this appointment.  You may see Ena Dawley, MD or one of the following Advanced Practice Providers on your designated Care Team:   . Lyda Jester, PA-C . Dayna Dunn, PA-C . Ermalinda Barrios, PA-C  Any Other Special Instructions Will Be Listed Below (If Applicable).  Your physician encouraged you to lose weight for better health.  Your provider recommends that you maintain 150 minutes per week of moderate aerobic activity.

## 2019-07-31 ENCOUNTER — Telehealth: Payer: Self-pay | Admitting: *Deleted

## 2019-07-31 NOTE — Telephone Encounter (Signed)
Pt has been notified of lab results by phone with verbal understanding. Pt did have a question he forgot to ask Ermalinda Barrios, PAC. He is looking at purchasing exercise equipment and would like input/recommendation from Dr. Meda Coffee or Ermalinda Barrios, [facility] as to which one they feel would be better for him. He states he did cardiac rehab and used the recumbent bike, treadmill and stationary bike. He would like a recommendation as to which the doctor would feel would be better for him. He did say he was leaning towards the recumbent bike. I assured the pt that I will send a message to both Ermalinda Barrios [facility] and Dr. Meda Coffee for input and we will call back once we hear back. Pt thanked me for the call. Patient notified of result.  Please refer to phone note from today for complete details.   Julaine Hua, [facility] 07/31/2019 8:36 AM

## 2019-07-31 NOTE — Telephone Encounter (Signed)
-----   Message from Nuala Alpha, LPN sent at [id number]  5:44 PM EDT -----  ----- Message ----- From: Dorothy Spark, MD Sent: 07/30/2019   5:36 PM EDT To: Nuala Alpha, LPN  All labs normal including lipids

## 2019-07-31 NOTE — Telephone Encounter (Signed)
I have s/w pt and gone over recommendations per Ermalinda Barrios, PAC on exercise equipment. Pt advised he could use either a stationary bike or the recumbent bike. Either one of these would be fine as these would be easier on his knees. Pt is aware and thanked [place] for our help in giving him a recommendation. Pt asked for me to please let Ermalinda Barrios, [facility] know that he thanks her for her help.

## 2019-07-31 NOTE — Telephone Encounter (Signed)
I would recommend recumbent bike or stationary bike as they would be easier on his knees than treadmill. Whichever he's more comfortable with. thanks

## 2019-08-07 ENCOUNTER — Other Ambulatory Visit: Payer: Self-pay | Admitting: Cardiology

## 2019-08-07 DIAGNOSIS — I5022 Chronic systolic (congestive) heart failure: Secondary | ICD-10-CM

## 2019-08-20 ENCOUNTER — Ambulatory Visit (INDEPENDENT_AMBULATORY_CARE_PROVIDER_SITE_OTHER): Payer: Medicare HMO | Admitting: *Deleted

## 2019-08-20 DIAGNOSIS — I428 Other cardiomyopathies: Secondary | ICD-10-CM

## 2019-08-20 DIAGNOSIS — I472 Ventricular tachycardia, unspecified: Secondary | ICD-10-CM

## 2019-08-20 LAB — CUP PACEART REMOTE DEVICE CHECK
Battery Remaining Longevity: 38 mo
Battery Remaining Percentage: 47 %
Battery Voltage: 2.93 V
Brady Statistic AP VP Percent: 9.5 %
Brady Statistic AP VS Percent: 1 %
Brady Statistic AS VP Percent: 81 %
Brady Statistic AS VS Percent: 4.1 %
Brady Statistic RA Percent Paced: 4.7 %
Date Time Interrogation Session: 20201026071006
HighPow Impedance: 73 Ohm
HighPow Impedance: 73 Ohm
Implantable Lead Implant Date: 20161011
Implantable Lead Implant Date: 20161011
Implantable Lead Implant Date: 20161011
Implantable Lead Location: 753858
Implantable Lead Location: 753860
Implantable Lead Location: 753860
Implantable Pulse Generator Implant Date: 20161011
Lead Channel Impedance Value: 350 Ohm
Lead Channel Impedance Value: 390 Ohm
Lead Channel Impedance Value: 610 Ohm
Lead Channel Pacing Threshold Amplitude: 0.5 V
Lead Channel Pacing Threshold Amplitude: 0.625 V
Lead Channel Pacing Threshold Amplitude: 0.75 V
Lead Channel Pacing Threshold Pulse Width: 0.5 ms
Lead Channel Pacing Threshold Pulse Width: 0.5 ms
Lead Channel Pacing Threshold Pulse Width: 0.8 ms
Lead Channel Sensing Intrinsic Amplitude: 3.1 mV
Lead Channel Sensing Intrinsic Amplitude: 7 mV
Lead Channel Setting Pacing Amplitude: 1.5 V
Lead Channel Setting Pacing Amplitude: 2 V
Lead Channel Setting Pacing Amplitude: 2 V
Lead Channel Setting Pacing Pulse Width: 0.5 ms
Lead Channel Setting Pacing Pulse Width: 0.8 ms
Lead Channel Setting Sensing Sensitivity: 0.5 mV
Pulse Gen Serial Number: 7276664

## 2019-09-05 DIAGNOSIS — G4733 Obstructive sleep apnea (adult) (pediatric): Secondary | ICD-10-CM | POA: Diagnosis not present

## 2019-09-10 NOTE — Progress Notes (Signed)
Remote ICD transmission.   

## 2019-09-12 DIAGNOSIS — H35033 Hypertensive retinopathy, bilateral: Secondary | ICD-10-CM | POA: Diagnosis not present

## 2019-09-12 DIAGNOSIS — Z961 Presence of intraocular lens: Secondary | ICD-10-CM | POA: Diagnosis not present

## 2019-09-12 DIAGNOSIS — H40013 Open angle with borderline findings, low risk, bilateral: Secondary | ICD-10-CM | POA: Diagnosis not present

## 2019-09-12 DIAGNOSIS — H26493 Other secondary cataract, bilateral: Secondary | ICD-10-CM | POA: Diagnosis not present

## 2019-09-23 ENCOUNTER — Telehealth: Payer: Self-pay

## 2019-09-25 ENCOUNTER — Telehealth (INDEPENDENT_AMBULATORY_CARE_PROVIDER_SITE_OTHER): Payer: Medicare HMO | Admitting: Internal Medicine

## 2019-09-25 ENCOUNTER — Encounter: Payer: Self-pay | Admitting: Internal Medicine

## 2019-09-25 VITALS — Ht 71.0 in | Wt 266.0 lb

## 2019-09-25 DIAGNOSIS — Z08 Encounter for follow-up examination after completed treatment for malignant neoplasm: Secondary | ICD-10-CM | POA: Diagnosis not present

## 2019-09-25 DIAGNOSIS — I5022 Chronic systolic (congestive) heart failure: Secondary | ICD-10-CM | POA: Diagnosis not present

## 2019-09-25 DIAGNOSIS — I480 Paroxysmal atrial fibrillation: Secondary | ICD-10-CM

## 2019-09-25 DIAGNOSIS — L821 Other seborrheic keratosis: Secondary | ICD-10-CM | POA: Diagnosis not present

## 2019-09-25 DIAGNOSIS — L570 Actinic keratosis: Secondary | ICD-10-CM | POA: Diagnosis not present

## 2019-09-25 DIAGNOSIS — I255 Ischemic cardiomyopathy: Secondary | ICD-10-CM | POA: Diagnosis not present

## 2019-09-25 DIAGNOSIS — I472 Ventricular tachycardia, unspecified: Secondary | ICD-10-CM

## 2019-09-25 DIAGNOSIS — Z85828 Personal history of other malignant neoplasm of skin: Secondary | ICD-10-CM | POA: Diagnosis not present

## 2019-09-25 DIAGNOSIS — I428 Other cardiomyopathies: Secondary | ICD-10-CM | POA: Diagnosis not present

## 2019-09-25 NOTE — Progress Notes (Signed)
Electrophysiology TeleHealth Note   Due to national recommendations of social distancing due to COVID 19, an audio/video telehealth visit is felt to be most appropriate for this patient at this time.  See MyChart message from today for the patient's consent to telehealth for [facility].   Date:  09/25/2019   ID:  [name], DOB 17-Mar-1941, MRN [id number]  Location: patient's home  Provider location:  [facility]  Evaluation Performed: Follow-up visit  PCP:  Orpah Melter, MD   Electrophysiologist:  Dr Rayann Heman  Chief Complaint:  CHF  History of Present Illness:    [name] is a 78 y.o. male who presents via telehealth conferencing today.  Since last being seen in our clinic, the patient reports doing very well.  Lymphedema in R leg is stable.  Today, he denies symptoms of palpitations, chest pain, shortness of breath, dizziness, presyncope, or syncope.  The patient is otherwise without complaint today.  The patient denies symptoms of fevers, chills, cough, or new SOB worrisome for COVID 19.  Past Medical History:  Diagnosis Date  . Acid reflux   . CAD (coronary artery disease)    PCI to Circ with DES on 04/06/15  . CHF (congestive heart failure) ([place])   . Degenerative disc disease, thoracic   . Hyperlipidemia   . Hyperplastic colon polyp   . LBBB (left bundle branch block)   . 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 Nonischemic cardiomyopathy ([place])    a. s/p STJ CRTD 07/2015  . Obesity   . Paroxysmal atrial fibrillation (H[facility])    a. identified on device interrogation. All episodes <3 hours  . Ventricular fibrillation ([place])    a. appropriate ICD therapy 02/2017    Past Surgical History:  Procedure Laterality Date  . CARDIAC CATHETERIZATION N/A 04/06/2015   Procedure: Right/Left Heart Cath and Coronary Angiography;  Surgeon: Larey Dresser, MD;  Location: [place] [facility];   Service: Cardiovascular;  Laterality: N/A;  . CARDIAC CATHETERIZATION N/A 04/06/2015   Procedure: Coronary Stent Intervention;  Surgeon: Peter M Martinique, MD;  Location: [place] [facility];  Service: Cardiovascular;  Laterality: N/A;  . CATARACT EXTRACTION W/ INTRAOCULAR LENS  IMPLANT, BILATERAL Bilateral 11/2014  . COLONOSCOPY  2009  . EP IMPLANTABLE DEVICE N/A 08/04/2015   STJ CRTD implanted by Dr Rayann Heman for primary prevention/CHF  . KNEE CARTILAGE SURGERY Left 1960  . SKIN CANCER EXCISION  05/2005; 07/2005   excision of merkel cell  . TONSILLECTOMY      Current Outpatient Medications  Medication Sig Dispense Refill  . acetaminophen (TYLENOL) 325 MG tablet Take 650 mg by mouth every 6 (six) hours as needed for mild pain or moderate pain.    Marland Kitchen aspirin 81 MG tablet Take 81 mg by mouth daily.    Marland Kitchen atorvastatin (LIPITOR) 80 MG tablet Take 1 tablet (80 mg total) by mouth daily. Please keep upcoming appt in October with Dr. Meda Coffee for future refills. Thank you 90 tablet 0  . carvedilol (COREG) 12.5 MG tablet TAKE 1 TABLET (12.5 MG TOTAL) BY MOUTH 2 (TWO) TIMES DAILY WITH A MEAL. 180 tablet 3  . fluorouracil (EFUDEX) 5 % cream Apply 5 application topically daily as needed (Patches on face).   0  . furosemide (LASIX) 20 MG tablet Take 2 tabs (40 mg total) twice daily for 3 days, then take 1 tab (20 mg total) twice daily thereafter.  80 tablet 0  . ibuprofen (ADVIL,MOTRIN) 200 MG tablet Take 200 mg by mouth 2 (two) times daily as needed (pain).    Marland Kitchen levothyroxine (SYNTHROID, LEVOTHROID) 50 MCG tablet Take 50 mcg by mouth daily.  5  . losartan (COZAAR) 25 MG tablet TAKE 1 TAB BY MOUTH DAILY. 90 tablet 4  . POTASSIUM PO Take 550 mg by mouth daily as needed (Potassium supplement).     Marland Kitchen spironolactone (ALDACTONE) 25 MG tablet Take 0.5 tablets (12.5 mg total) by mouth daily. 45 tablet 4   No current facility-administered medications for this visit.    Facility-Administered Medications Ordered in Other  Visits  Medication Dose Route Frequency Provider Last Rate Last Dose  . adenosine (diagnostic) (ADENOSCAN) infusion 68.4 mg  0.56 mg/kg Intravenous Once Crenshaw, Denice Bors, MD        Allergies:   Tape   Social History:  The patient  reports that he quit smoking about 35 years ago. His smoking use included cigarettes. He has a 3.75 pack-year smoking history. He has never used smokeless tobacco. He reports current alcohol use. He reports that he does not use drugs.   Family History:  The patient's family history includes Cancer in his father and mother; Lupus in his mother; Stroke in his sister; Thyroid disease in his sister.   ROS:  Please see the history of present illness.   All other systems are personally reviewed and negative.    Exam:    Vital Signs:  Ht 5\' 11"  (1.803 m)   Wt 266 lb (120.7 kg)   BMI 37.10 kg/m  BP 111/72, HR 77 Well sounding and appearing, alert and conversant, regular work of breathing,  good skin color Eyes- anicteric, neuro- grossly intact, skin- no apparent rash or lesions or cyanosis, mouth- oral mucosa is pink  Labs/Other Tests and Data Reviewed:    Recent Labs: 07/30/2019: ALT 23; BUN 20; Creatinine, Ser 1.26; Hemoglobin 13.8; Platelets 139; Potassium 4.1; Sodium 141; TSH 3.460   Wt Readings from Last 3 Encounters:  09/25/19 266 lb (120.7 kg)  07/30/19 274 lb 12.8 oz (124.6 kg)  01/30/19 266 lb (120.7 kg)     Last device remote is reviewed from [facility] which reveals normal device function, no arrhythmias , BiV paced 90%   ASSESSMENT & PLAN:    1.  CHF/ chronic systolic dysfunction/ ischemic CM No CHF symptoms No ischemic symtoms Enroll in ICM device clinic S/p CRT  2. VF Normal ICD function  3. afib (asymptoamtic) Observed by ICD interrogation,longest episode 16 minutes in the past year Discussed with the patient today Asymptomatic Consider anticoagulation if AF burden increases  4. Morbid obesity Lifestyle modification encouraged   Follow-up:  Return to see me in a year   Patient Risk:  after full review of this patients clinical status, I feel that they are at moderate risk at this time.  Today, I have spent 15 minutes with the patient with telehealth technology discussing arrhythmia management .    Army Fossa, MD  09/25/2019 2:18 PM     [address] [place] [address] [phone] (office) [phone] (fax)

## 2019-09-26 DIAGNOSIS — E039 Hypothyroidism, unspecified: Secondary | ICD-10-CM | POA: Diagnosis not present

## 2019-09-26 DIAGNOSIS — I10 Essential (primary) hypertension: Secondary | ICD-10-CM | POA: Diagnosis not present

## 2019-09-26 DIAGNOSIS — Z1211 Encounter for screening for malignant neoplasm of colon: Secondary | ICD-10-CM | POA: Diagnosis not present

## 2019-09-26 DIAGNOSIS — Z0000 Encounter for general adult medical examination without abnormal findings: Secondary | ICD-10-CM | POA: Diagnosis not present

## 2019-09-26 DIAGNOSIS — I4901 Ventricular fibrillation: Secondary | ICD-10-CM | POA: Diagnosis not present

## 2019-09-26 DIAGNOSIS — I509 Heart failure, unspecified: Secondary | ICD-10-CM | POA: Diagnosis not present

## 2019-09-26 DIAGNOSIS — E7800 Pure hypercholesterolemia, unspecified: Secondary | ICD-10-CM | POA: Diagnosis not present

## 2019-09-26 DIAGNOSIS — I2510 Atherosclerotic heart disease of native coronary artery without angina pectoris: Secondary | ICD-10-CM | POA: Diagnosis not present

## 2019-10-03 DIAGNOSIS — Z1211 Encounter for screening for malignant neoplasm of colon: Secondary | ICD-10-CM | POA: Diagnosis not present

## 2019-10-12 ENCOUNTER — Other Ambulatory Visit: Payer: Self-pay | Admitting: Cardiology

## 2019-10-12 DIAGNOSIS — Z95810 Presence of automatic (implantable) cardiac defibrillator: Secondary | ICD-10-CM

## 2019-10-12 DIAGNOSIS — I2510 Atherosclerotic heart disease of native coronary artery without angina pectoris: Secondary | ICD-10-CM

## 2019-10-12 DIAGNOSIS — I519 Heart disease, unspecified: Secondary | ICD-10-CM

## 2019-10-12 DIAGNOSIS — I428 Other cardiomyopathies: Secondary | ICD-10-CM

## 2019-10-12 DIAGNOSIS — E785 Hyperlipidemia, unspecified: Secondary | ICD-10-CM

## 2019-10-12 DIAGNOSIS — I447 Left bundle-branch block, unspecified: Secondary | ICD-10-CM

## 2019-10-12 DIAGNOSIS — R05 Cough: Secondary | ICD-10-CM

## 2019-10-12 DIAGNOSIS — I5022 Chronic systolic (congestive) heart failure: Secondary | ICD-10-CM

## 2019-10-12 DIAGNOSIS — R059 Cough, unspecified: Secondary | ICD-10-CM

## 2019-10-15 ENCOUNTER — Telehealth: Payer: Self-pay

## 2019-10-15 NOTE — Telephone Encounter (Signed)
Patient referred to [facility] clinic by Dr Rayann Heman following 09/25/2019 Telehealth visit.  Attempted ICM intro call and no answer.  Pt was previously enrolled in [facility] clinic in 2017 but opted out of monthly follow up.

## 2019-10-28 NOTE — Telephone Encounter (Signed)
Attempted ICM intro call to patient and left message with phone number for return call.

## 2019-11-04 ENCOUNTER — Ambulatory Visit: Payer: Medicare Other | Attending: Internal Medicine

## 2019-11-04 DIAGNOSIS — Z23 Encounter for immunization: Secondary | ICD-10-CM

## 2019-11-04 NOTE — Progress Notes (Signed)
   Covid-19 Vaccination Clinic  Name:  [name]    MRN: [id number] DOB: 1941-02-17  11/04/2019  Mr. [name] was observed post Covid-19 immunization for 30 minutes based on pre-vaccination screening without incidence. He was provided with Vaccine Information Sheet and instruction to access the V-Safe system.   Mr. [name] was instructed to call 911 with any severe reactions post vaccine: Marland Kitchen Difficulty breathing  . Swelling of your face and throat  . A fast heartbeat  . A bad rash all over your body  . Dizziness and weakness    Immunizations Administered    Name Date Dose VIS Date Route   Pfizer COVID-19 Vaccine 11/04/2019  8:28 AM 0.3 mL 10/04/2019 Intramuscular   Manufacturer: [employer], [employer]   Lot: [id number]   [place]: [id number]

## 2019-11-11 NOTE — Telephone Encounter (Signed)
Unable to reach patient for ICM enrollment.  Device clinic will continue 91 day remote monitoring per protocol.

## 2019-11-19 ENCOUNTER — Ambulatory Visit (INDEPENDENT_AMBULATORY_CARE_PROVIDER_SITE_OTHER): Payer: Medicare HMO | Admitting: *Deleted

## 2019-11-19 DIAGNOSIS — I472 Ventricular tachycardia, unspecified: Secondary | ICD-10-CM

## 2019-11-19 LAB — CUP PACEART REMOTE DEVICE CHECK
Battery Remaining Longevity: 36 mo
Battery Remaining Percentage: 44 %
Battery Voltage: 2.93 V
Brady Statistic AP VP Percent: 9.6 %
Brady Statistic AP VS Percent: 1 %
Brady Statistic AS VP Percent: 81 %
Brady Statistic AS VS Percent: 4 %
Brady Statistic RA Percent Paced: 4.7 %
Date Time Interrogation Session: 20210125124127
HighPow Impedance: 81 Ohm
HighPow Impedance: 81 Ohm
Implantable Lead Implant Date: 20161011
Implantable Lead Implant Date: 20161011
Implantable Lead Implant Date: 20161011
Implantable Lead Location: 753858
Implantable Lead Location: 753860
Implantable Lead Location: 753860
Implantable Pulse Generator Implant Date: 20161011
Lead Channel Impedance Value: 330 Ohm
Lead Channel Impedance Value: 380 Ohm
Lead Channel Impedance Value: 580 Ohm
Lead Channel Pacing Threshold Amplitude: 0.5 V
Lead Channel Pacing Threshold Amplitude: 0.75 V
Lead Channel Pacing Threshold Amplitude: 0.875 V
Lead Channel Pacing Threshold Pulse Width: 0.5 ms
Lead Channel Pacing Threshold Pulse Width: 0.5 ms
Lead Channel Pacing Threshold Pulse Width: 0.8 ms
Lead Channel Sensing Intrinsic Amplitude: 11.8 mV
Lead Channel Sensing Intrinsic Amplitude: 2 mV
Lead Channel Setting Pacing Amplitude: 1.5 V
Lead Channel Setting Pacing Amplitude: 2 V
Lead Channel Setting Pacing Amplitude: 2 V
Lead Channel Setting Pacing Pulse Width: 0.5 ms
Lead Channel Setting Pacing Pulse Width: 0.8 ms
Lead Channel Setting Sensing Sensitivity: 0.5 mV
Pulse Gen Serial Number: 7276664

## 2019-11-24 ENCOUNTER — Ambulatory Visit: Payer: Medicare HMO | Attending: Internal Medicine

## 2019-11-24 DIAGNOSIS — Z23 Encounter for immunization: Secondary | ICD-10-CM

## 2019-11-24 NOTE — Progress Notes (Signed)
   Covid-19 Vaccination Clinic  Name:  [name]    MRN: [id number] DOB: [birth year]  11/24/2019  [name] was observed post Covid-19 immunization for 15 minutes without incidence. He was provided with Vaccine Information Sheet and instruction to access the V-Safe system.   [name] was instructed to call 911 with any severe reactions post vaccine: Marland Kitchen Difficulty breathing  . Swelling of your face and throat  . A fast heartbeat  . A bad rash all over your body  . Dizziness and weakness    Immunizations Administered    Name Date Dose VIS Date Route   Pfizer COVID-19 Vaccine 11/24/2019  9:33 AM 0.3 mL 10/04/2019 Intramuscular   Manufacturer: [place]   Lot: [id number]   [place]: [id number]

## 2020-01-30 DIAGNOSIS — G4733 Obstructive sleep apnea (adult) (pediatric): Secondary | ICD-10-CM | POA: Diagnosis not present

## 2020-02-17 LAB — CUP PACEART REMOTE DEVICE CHECK
Battery Remaining Longevity: 34 mo
Battery Remaining Percentage: 41 %
Battery Voltage: 2.92 V
Brady Statistic AP VP Percent: 9.7 %
Brady Statistic AP VS Percent: 1 %
Brady Statistic AS VP Percent: 81 %
Brady Statistic AS VS Percent: 4 %
Brady Statistic RA Percent Paced: 4.8 %
Date Time Interrogation Session: 20210426121600
HighPow Impedance: 74 Ohm
HighPow Impedance: 74 Ohm
Implantable Lead Implant Date: 20161011
Implantable Lead Implant Date: 20161011
Implantable Lead Implant Date: 20161011
Implantable Lead Location: 753858
Implantable Lead Location: 753860
Implantable Lead Location: 753860
Implantable Pulse Generator Implant Date: 20161011
Lead Channel Impedance Value: 350 Ohm
Lead Channel Impedance Value: 390 Ohm
Lead Channel Impedance Value: 610 Ohm
Lead Channel Pacing Threshold Amplitude: 0.5 V
Lead Channel Pacing Threshold Amplitude: 0.75 V
Lead Channel Pacing Threshold Amplitude: 0.75 V
Lead Channel Pacing Threshold Pulse Width: 0.5 ms
Lead Channel Pacing Threshold Pulse Width: 0.5 ms
Lead Channel Pacing Threshold Pulse Width: 0.8 ms
Lead Channel Sensing Intrinsic Amplitude: 11.9 mV
Lead Channel Sensing Intrinsic Amplitude: 3.1 mV
Lead Channel Setting Pacing Amplitude: 1.5 V
Lead Channel Setting Pacing Amplitude: 2 V
Lead Channel Setting Pacing Amplitude: 2 V
Lead Channel Setting Pacing Pulse Width: 0.5 ms
Lead Channel Setting Pacing Pulse Width: 0.8 ms
Lead Channel Setting Sensing Sensitivity: 0.5 mV
Pulse Gen Serial Number: 7276664

## 2020-02-18 ENCOUNTER — Ambulatory Visit (INDEPENDENT_AMBULATORY_CARE_PROVIDER_SITE_OTHER): Payer: Medicare HMO | Admitting: *Deleted

## 2020-02-18 DIAGNOSIS — I472 Ventricular tachycardia, unspecified: Secondary | ICD-10-CM

## 2020-02-19 NOTE — Progress Notes (Signed)
ICD Remote  

## 2020-03-13 ENCOUNTER — Other Ambulatory Visit: Payer: Self-pay | Admitting: Cardiology

## 2020-03-13 DIAGNOSIS — I519 Heart disease, unspecified: Secondary | ICD-10-CM

## 2020-03-13 DIAGNOSIS — R059 Cough, unspecified: Secondary | ICD-10-CM

## 2020-03-13 DIAGNOSIS — I2510 Atherosclerotic heart disease of native coronary artery without angina pectoris: Secondary | ICD-10-CM

## 2020-03-13 DIAGNOSIS — Z95810 Presence of automatic (implantable) cardiac defibrillator: Secondary | ICD-10-CM

## 2020-03-13 DIAGNOSIS — I447 Left bundle-branch block, unspecified: Secondary | ICD-10-CM

## 2020-03-13 DIAGNOSIS — I428 Other cardiomyopathies: Secondary | ICD-10-CM

## 2020-03-13 DIAGNOSIS — I5022 Chronic systolic (congestive) heart failure: Secondary | ICD-10-CM

## 2020-03-13 DIAGNOSIS — E785 Hyperlipidemia, unspecified: Secondary | ICD-10-CM

## 2020-03-30 DIAGNOSIS — D1801 Hemangioma of skin and subcutaneous tissue: Secondary | ICD-10-CM | POA: Diagnosis not present

## 2020-03-30 DIAGNOSIS — L570 Actinic keratosis: Secondary | ICD-10-CM | POA: Diagnosis not present

## 2020-03-30 DIAGNOSIS — Z85828 Personal history of other malignant neoplasm of skin: Secondary | ICD-10-CM | POA: Diagnosis not present

## 2020-03-30 DIAGNOSIS — L218 Other seborrheic dermatitis: Secondary | ICD-10-CM | POA: Diagnosis not present

## 2020-03-30 DIAGNOSIS — W908XXS Exposure to other nonionizing radiation, sequela: Secondary | ICD-10-CM | POA: Diagnosis not present

## 2020-03-30 DIAGNOSIS — L578 Other skin changes due to chronic exposure to nonionizing radiation: Secondary | ICD-10-CM | POA: Diagnosis not present

## 2020-04-18 ENCOUNTER — Other Ambulatory Visit: Payer: Self-pay | Admitting: Cardiology

## 2020-04-18 DIAGNOSIS — R059 Cough, unspecified: Secondary | ICD-10-CM

## 2020-04-18 DIAGNOSIS — I2510 Atherosclerotic heart disease of native coronary artery without angina pectoris: Secondary | ICD-10-CM

## 2020-04-18 DIAGNOSIS — I5022 Chronic systolic (congestive) heart failure: Secondary | ICD-10-CM

## 2020-04-18 DIAGNOSIS — I519 Heart disease, unspecified: Secondary | ICD-10-CM

## 2020-04-18 DIAGNOSIS — I447 Left bundle-branch block, unspecified: Secondary | ICD-10-CM

## 2020-04-18 DIAGNOSIS — E785 Hyperlipidemia, unspecified: Secondary | ICD-10-CM

## 2020-04-18 DIAGNOSIS — Z95810 Presence of automatic (implantable) cardiac defibrillator: Secondary | ICD-10-CM

## 2020-04-18 DIAGNOSIS — I428 Other cardiomyopathies: Secondary | ICD-10-CM

## 2020-05-19 ENCOUNTER — Ambulatory Visit (INDEPENDENT_AMBULATORY_CARE_PROVIDER_SITE_OTHER): Payer: Medicare HMO | Admitting: *Deleted

## 2020-05-19 DIAGNOSIS — I255 Ischemic cardiomyopathy: Secondary | ICD-10-CM

## 2020-05-20 ENCOUNTER — Telehealth: Payer: Self-pay | Admitting: Emergency Medicine

## 2020-05-20 LAB — CUP PACEART REMOTE DEVICE CHECK
Battery Remaining Longevity: 32 mo
Battery Remaining Percentage: 38 %
Battery Voltage: 2.92 V
Brady Statistic AP VP Percent: 10 %
Brady Statistic AP VS Percent: 1 %
Brady Statistic AS VP Percent: 80 %
Brady Statistic AS VS Percent: 4 %
Brady Statistic RA Percent Paced: 5 %
Date Time Interrogation Session: 20210727124811
HighPow Impedance: 79 Ohm
HighPow Impedance: 79 Ohm
Implantable Lead Implant Date: 20161011
Implantable Lead Implant Date: 20161011
Implantable Lead Implant Date: 20161011
Implantable Lead Location: 753858
Implantable Lead Location: 753860
Implantable Lead Location: 753860
Implantable Pulse Generator Implant Date: 20161011
Lead Channel Impedance Value: 390 Ohm
Lead Channel Impedance Value: 430 Ohm
Lead Channel Impedance Value: 700 Ohm
Lead Channel Pacing Threshold Amplitude: 0.5 V
Lead Channel Pacing Threshold Amplitude: 0.75 V
Lead Channel Pacing Threshold Amplitude: 0.75 V
Lead Channel Pacing Threshold Pulse Width: 0.5 ms
Lead Channel Pacing Threshold Pulse Width: 0.5 ms
Lead Channel Pacing Threshold Pulse Width: 0.8 ms
Lead Channel Sensing Intrinsic Amplitude: 11.9 mV
Lead Channel Sensing Intrinsic Amplitude: 3.1 mV
Lead Channel Setting Pacing Amplitude: 1.5 V
Lead Channel Setting Pacing Amplitude: 2 V
Lead Channel Setting Pacing Amplitude: 2 V
Lead Channel Setting Pacing Pulse Width: 0.5 ms
Lead Channel Setting Pacing Pulse Width: 0.8 ms
Lead Channel Setting Sensing Sensitivity: 0.5 mV
Pulse Gen Serial Number: 7276664

## 2020-05-20 NOTE — Telephone Encounter (Signed)
Patient has been asymptomatic with AF episodes. Longest episode recorded on scheduled transmission was 6 hrs, 31 min, 12 seconds on July 25 , 2021 at 2151. Patient reports that his wife fell around that time and he had to call an ambulance to transport her for care. He had no CP, SOB, dizziness, syncope or decreased energy level during the event. No OAC. History of AF . AT/AF burden was < 1%.

## 2020-05-22 NOTE — Progress Notes (Signed)
Remote ICD transmission.   

## 2020-05-23 NOTE — Telephone Encounter (Signed)
He will need to go to atrial fib clinic to consider starting an [place].

## 2020-06-02 NOTE — Telephone Encounter (Signed)
Patient contacted and will agree to follow up in AF clinic.he reports he has had decreased energy over the past month intermittently.

## 2020-06-16 ENCOUNTER — Other Ambulatory Visit: Payer: Self-pay

## 2020-06-16 ENCOUNTER — Ambulatory Visit (HOSPITAL_COMMUNITY)
Admission: RE | Admit: 2020-06-16 | Discharge: 2020-06-16 | Disposition: A | Discharge status: Home / Self Care | Payer: Medicare HMO | Source: Ambulatory Visit | Attending: Physician Assistant | Admitting: Physician Assistant

## 2020-06-16 ENCOUNTER — Encounter (HOSPITAL_COMMUNITY): Payer: Self-pay | Admitting: Physician Assistant

## 2020-06-16 VITALS — BP 110/62 | HR 69 | Ht 71.0 in | Wt 277.4 lb

## 2020-06-16 DIAGNOSIS — I428 Other cardiomyopathies: Secondary | ICD-10-CM | POA: Diagnosis not present

## 2020-06-16 DIAGNOSIS — I447 Left bundle-branch block, unspecified: Secondary | ICD-10-CM | POA: Insufficient documentation

## 2020-06-16 DIAGNOSIS — Z6838 Body mass index (BMI) 38.0-38.9, adult: Secondary | ICD-10-CM | POA: Insufficient documentation

## 2020-06-16 DIAGNOSIS — Z9221 Personal history of antineoplastic chemotherapy: Secondary | ICD-10-CM | POA: Diagnosis not present

## 2020-06-16 DIAGNOSIS — Z87891 Personal history of nicotine dependence: Secondary | ICD-10-CM | POA: Diagnosis not present

## 2020-06-16 DIAGNOSIS — Z9841 Cataract extraction status, right eye: Secondary | ICD-10-CM | POA: Insufficient documentation

## 2020-06-16 DIAGNOSIS — Z85821 Personal history of Merkel cell carcinoma: Secondary | ICD-10-CM | POA: Diagnosis not present

## 2020-06-16 DIAGNOSIS — E669 Obesity, unspecified: Secondary | ICD-10-CM | POA: Diagnosis not present

## 2020-06-16 DIAGNOSIS — Z79899 Other long term (current) drug therapy: Secondary | ICD-10-CM | POA: Insufficient documentation

## 2020-06-16 DIAGNOSIS — Z9842 Cataract extraction status, left eye: Secondary | ICD-10-CM | POA: Diagnosis not present

## 2020-06-16 DIAGNOSIS — E785 Hyperlipidemia, unspecified: Secondary | ICD-10-CM | POA: Diagnosis not present

## 2020-06-16 DIAGNOSIS — I4901 Ventricular fibrillation: Secondary | ICD-10-CM | POA: Diagnosis not present

## 2020-06-16 DIAGNOSIS — I110 Hypertensive heart disease with heart failure: Secondary | ICD-10-CM | POA: Diagnosis not present

## 2020-06-16 DIAGNOSIS — D6869 Other thrombophilia: Secondary | ICD-10-CM | POA: Insufficient documentation

## 2020-06-16 DIAGNOSIS — Z923 Personal history of irradiation: Secondary | ICD-10-CM | POA: Diagnosis not present

## 2020-06-16 DIAGNOSIS — I480 Paroxysmal atrial fibrillation: Secondary | ICD-10-CM | POA: Insufficient documentation

## 2020-06-16 DIAGNOSIS — I2510 Atherosclerotic heart disease of native coronary artery without angina pectoris: Secondary | ICD-10-CM | POA: Insufficient documentation

## 2020-06-16 DIAGNOSIS — Z7901 Long term (current) use of anticoagulants: Secondary | ICD-10-CM | POA: Diagnosis not present

## 2020-06-16 DIAGNOSIS — Z79890 Hormone replacement therapy (postmenopausal): Secondary | ICD-10-CM | POA: Insufficient documentation

## 2020-06-16 DIAGNOSIS — Z95810 Presence of automatic (implantable) cardiac defibrillator: Secondary | ICD-10-CM | POA: Insufficient documentation

## 2020-06-16 DIAGNOSIS — I890 Lymphedema, not elsewhere classified: Secondary | ICD-10-CM | POA: Diagnosis not present

## 2020-06-16 DIAGNOSIS — K219 Gastro-esophageal reflux disease without esophagitis: Secondary | ICD-10-CM | POA: Diagnosis not present

## 2020-06-16 DIAGNOSIS — Z961 Presence of intraocular lens: Secondary | ICD-10-CM | POA: Diagnosis not present

## 2020-06-16 DIAGNOSIS — M5134 Other intervertebral disc degeneration, thoracic region: Secondary | ICD-10-CM | POA: Insufficient documentation

## 2020-06-16 DIAGNOSIS — Z791 Long term (current) use of non-steroidal anti-inflammatories (NSAID): Secondary | ICD-10-CM | POA: Insufficient documentation

## 2020-06-16 DIAGNOSIS — Z955 Presence of coronary angioplasty implant and graft: Secondary | ICD-10-CM | POA: Diagnosis not present

## 2020-06-16 DIAGNOSIS — I5022 Chronic systolic (congestive) heart failure: Secondary | ICD-10-CM | POA: Diagnosis not present

## 2020-06-16 LAB — CBC
HCT: 38.8 % — ABNORMAL LOW (ref 39.0–52.0)
Hemoglobin: 12.9 g/dL — ABNORMAL LOW (ref 13.0–17.0)
MCH: 33.9 pg (ref 26.0–34.0)
MCHC: 33.2 g/dL (ref 30.0–36.0)
MCV: 101.8 fL — ABNORMAL HIGH (ref 80.0–100.0)
Platelets: 127 10*3/uL — ABNORMAL LOW (ref 150–400)
RBC: 3.81 MIL/uL — ABNORMAL LOW (ref 4.22–5.81)
RDW: 12.9 % (ref 11.5–15.5)
WBC: 3.8 10*3/uL — ABNORMAL LOW (ref 4.0–10.5)
nRBC: 0 % (ref 0.0–0.2)

## 2020-06-16 LAB — BASIC METABOLIC PANEL
Anion gap: 9 (ref 5–15)
BUN: 23 mg/dL (ref 8–23)
CO2: 25 mmol/L (ref 22–32)
Calcium: 8.8 mg/dL — ABNORMAL LOW (ref 8.9–10.3)
Chloride: 105 mmol/L (ref 98–111)
Creatinine, Ser: 1.16 mg/dL (ref 0.61–1.24)
GFR calc Af Amer: >60 mL/min (ref >60)
GFR calc non Af Amer: 60 mL/min — ABNORMAL LOW (ref >60)
Glucose, Bld: 107 mg/dL — ABNORMAL HIGH (ref 70–99)
Potassium: 4 mmol/L (ref 3.5–5.1)
Sodium: 139 mmol/L (ref 135–145)

## 2020-06-16 LAB — TSH: TSH: 2.327 u[IU]/mL (ref 0.350–4.500)

## 2020-06-16 MED ORDER — APIXABAN 5 MG PO TABS
5.0000 mg | ORAL_TABLET | Freq: Two times a day (BID) | ORAL | 3 refills | Status: DC
Start: 2020-06-16 — End: 2020-10-05

## 2020-06-16 NOTE — Patient Instructions (Signed)
Stop aspirin  Start Eliquis 5 mg twice a day.

## 2020-06-16 NOTE — Progress Notes (Signed)
Primary Care Physician: Orpah Melter, MD Primary Cardiologist: Dr Meda Coffee Primary Electrophysiologist: Dr Rayann Heman Referring Physician: Device Clinic   [name] is a 79 y.o. male with a history of ICM, CAD, BiV ICD, VF, HTN, HLD, bilateral lymphedema, Merkel cell cancer s/p chemo and radiation, and paroxysmal atrial fibrillation who presents for follow up in the [facility]. The patient was initially diagnosed with atrial fibrillation in 2018 on device interrogation. Patient has a CHADS2VASC score of 5. His afib burden has been very low and anticoagulation was not started. The device clinic received an alert for a prolonged episode of afib lasting ~6 hours. Patient states his wife had fallen and required emergency treatment just before the onset of his afib. He denies any awareness of his arrhythmia.   Today, he denies symptoms of palpitations, chest pain, shortness of breath, orthopnea, PND, lower extremity edema, dizziness, presyncope, syncope, snoring, daytime somnolence, bleeding, or neurologic sequela. The patient is tolerating medications without difficulties and is otherwise without complaint today.    Atrial Fibrillation Risk Factors:  he does not have symptoms or diagnosis of sleep apnea. he does not have a history of rheumatic fever.   he has a BMI of Body mass index is 38.69 kg/m.Marland Kitchen Filed Weights   06/16/20 0833  Weight: 125.8 kg    Family History  Problem Relation Age of Onset  . Cancer Mother        breast  . Lupus Mother   . Cancer Father        prostate  . Stroke Sister   . Thyroid disease Sister   . Heart attack Neg Hx   . Hypertension Neg Hx      Atrial Fibrillation Management history:  Previous antiarrhythmic drugs: none Previous cardioversions: none Previous ablations: none CHADS2VASC score: 5 Anticoagulation history: none   Past Medical History:  Diagnosis Date  . Acid reflux   . CAD (coronary artery  disease)    PCI to Circ with DES on 04/06/15  . CHF (congestive heart failure) ([place])   . Degenerative disc disease, thoracic   . Hyperlipidemia   . Hyperplastic colon polyp   . LBBB (left bundle branch block)   . 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 Nonischemic cardiomyopathy ([place])    a. s/p STJ CRTD 07/2015  . Obesity   . Paroxysmal atrial fibrillation (H[facility])    a. identified on device interrogation. All episodes <3 hours  . Ventricular fibrillation ([place])    a. appropriate ICD therapy 02/2017   Past Surgical History:  Procedure Laterality Date  . CARDIAC CATHETERIZATION N/A 04/06/2015   Procedure: Right/Left Heart Cath and Coronary Angiography;  Surgeon: Larey Dresser, MD;  Location: [place] [facility];  Service: Cardiovascular;  Laterality: N/A;  . CARDIAC CATHETERIZATION N/A 04/06/2015   Procedure: Coronary Stent Intervention;  Surgeon: Peter M Martinique, MD;  Location: [place] [facility];  Service: Cardiovascular;  Laterality: N/A;  . CATARACT EXTRACTION W/ INTRAOCULAR LENS  IMPLANT, BILATERAL Bilateral 11/2014  . COLONOSCOPY  2009  . EP IMPLANTABLE DEVICE N/A 08/04/2015   STJ CRTD implanted by Dr Rayann Heman for primary prevention/CHF  . KNEE CARTILAGE SURGERY Left 1960  . SKIN CANCER EXCISION  05/2005; 07/2005   excision of merkel cell  . TONSILLECTOMY      Current Outpatient Medications  Medication Sig Dispense Refill  . acetaminophen (TYLENOL) 325 MG tablet Take 650 mg  by mouth every 6 (six) hours as needed for mild pain or moderate pain.    Marland Kitchen atorvastatin (LIPITOR) 80 MG tablet Take 1 tablet (80 mg total) by mouth daily. Please keep upcoming appt in October with Dr. Meda Coffee for future refills. Thank you 90 tablet 0  . carvedilol (COREG) 12.5 MG tablet TAKE 1 TABLET (12.5 MG TOTAL) BY MOUTH 2 (TWO) TIMES DAILY WITH A MEAL. 180 tablet 3  . fluorouracil (EFUDEX) 5 % cream Apply 5 application topically daily as  needed (Patches on face).   0  . furosemide (LASIX) 20 MG tablet TAKE 1 TAB BY MOUTH 2 TIMES DAILY. 180 tablet 3  . ibuprofen (ADVIL,MOTRIN) 200 MG tablet Take 200 mg by mouth 2 (two) times daily as needed (pain).    Marland Kitchen levothyroxine (SYNTHROID, LEVOTHROID) 50 MCG tablet Take 50 mcg by mouth daily.  5  . losartan (COZAAR) 25 MG tablet TAKE 1 TABLET BY MOUTH EVERY DAY 90 tablet 3  . POTASSIUM PO Take 550 mg by mouth daily as needed (Potassium supplement).     . SODIUM FLUORIDE 5000 PPM 1.1 % PSTE Take by mouth.    . spironolactone (ALDACTONE) 25 MG tablet Take 0.5 tablets (12.5 mg total) by mouth daily. Please make yearly appt with Dr. Meda Coffee for October for future refills. 1st attempt 45 tablet 0  . apixaban (ELIQUIS) 5 MG TABS tablet Take 1 tablet (5 mg total) by mouth 2 (two) times daily. 60 tablet 3   No current facility-administered medications for this encounter.   Facility-Administered Medications Ordered in Other Encounters  Medication Dose Route Frequency Provider Last Rate Last Admin  . adenosine (diagnostic) (ADENOSCAN) infusion 68.4 mg  0.56 mg/kg Intravenous Once Lelon Perla, MD        Allergies  Allergen Reactions  . Tape Other (See Comments)    Rash    Social History   Socioeconomic History  . Marital status: Married    Spouse name: bridgette  . Number of children: 2  . Years of education: college  . Highest education level: Not on file  Occupational History  . Occupation: Dube Loss adjuster, chartered  Tobacco Use  . Smoking status: Former Smoker    Packs/day: 0.25    Years: 15.00    Pack years: 3.75    Types: Cigarettes    Quit date: 10/25/1983    Years since quitting: 36.6  . Smokeless tobacco: Never Used  Vaping Use  . Vaping Use: Never used  Substance and Sexual Activity  . Alcohol use: Yes    Alcohol/week: 1.0 standard drink    Types: 1 Cans of beer per week    Comment: 08/04/2015 "I doubt if I average a glass of wine and 1 beer q week"  . Drug use:  No  . Sexual activity: Not Currently  Other Topics Concern  . Not on file  Social History Narrative   Pt lives in [place], married x 45 years.  2 grown sons have HTN.   Works as a Higher education careers adviser business)   Social Determinants of Radio broadcast assistant Strain:   . Difficulty of Paying Living Expenses: Not on file  Food Insecurity:   . Worried About Charity fundraiser in the Last Year: Not on file  . Ran Out of Food in the Last Year: Not on file  Transportation Needs:   . Lack of Transportation (Medical): Not on file  . Lack of Transportation (Non-Medical): Not on file  Physical  Activity:   . Days of Exercise per Week: Not on file  . Minutes of Exercise per Session: Not on file  Stress:   . Feeling of Stress : Not on file  Social Connections:   . Frequency of Communication with Friends and Family: Not on file  . Frequency of Social Gatherings with Friends and Family: Not on file  . Attends Religious Services: Not on file  . Active Member of Clubs or Organizations: Not on file  . Attends Archivist Meetings: Not on file  . Marital Status: Not on file  Intimate Partner Violence:   . Fear of Current or Ex-Partner: Not on file  . Emotionally Abused: Not on file  . Physically Abused: Not on file  . Sexually Abused: Not on file     ROS- All systems are reviewed and negative except as per the HPI above.  Physical Exam: Vitals:   06/16/20 0833  BP: 110/62  Pulse: 69  Weight: 125.8 kg  Height: 5\' 11"  (1.803 m)    GEN- The patient is well appearing obese elderly male, alert and oriented x 3 today.   Head- normocephalic, atraumatic Eyes-  Sclera clear, conjunctiva pink Ears- hearing intact Oropharynx- clear Neck- supple  Lungs- Clear to ausculation bilaterally, normal work of breathing Heart- Regular rate and rhythm, no murmurs, rubs or gallops  GI- soft, NT, ND, + BS Extremities- no clubbing, cyanosis, or edema MS- no significant deformity  or atrophy Skin- no rash or lesion Psych- euthymic mood, full affect Neuro- strength and sensation are intact  Wt Readings from Last 3 Encounters:  06/16/20 125.8 kg  09/25/19 120.7 kg  07/30/19 124.6 kg    EKG today demonstrates AV dual paced rhythm HR 69, PVCs, PR 162, QRS 204, QTc 533  Echo 03/08/17 demonstrated  - Left ventricle: Diffuse hypokinesis with abnormal septal motion.  The cavity size was moderately dilated. Wall thickness was  increased in a pattern of moderate LVH. Systolic function was  moderately reduced. The estimated ejection fraction was in the  range of 35% to 40%. Doppler parameters are consistent with both  elevated ventricular end-diastolic filling pressure and elevated  left atrial filling pressure.  - Mitral valve: There was mild regurgitation.  - Left atrium: The atrium was moderately dilated.  - Atrial septum: No defect or patent foramen ovale was identified.  - Pulmonary arteries: PA peak pressure: 33 mm Hg (S).   Epic records are reviewed at length today  CHA2DS2-VASc Score = 5  The patient's score is based upon: CHF History: 1 HTN History: 1 Age : 2 Diabetes History: 0 Stroke History: 0 Vascular Disease History: 1 Gender: 0      ASSESSMENT AND PLAN: 1. Paroxysmal Atrial Fibrillation (ICD10:  I48.0) The patient's CHA2DS2-VASc score is 5, indicating a 7.2% annual risk of stroke.   General education about afib provided and questions answered. We also discussed his stroke risk and the risks and benefits of anticoagulation.  Will plan to start Eliquis 5 mg BID. Stop ASA. Check bmet/CBC/TSH Continue Coreg 12.5 mg BID  2. Secondary Hypercoagulable State (ICD10:  D68.69) The patient is at significant risk for stroke/thromboembolism based upon his CHA2DS2-VASc Score of 5.  Start Apixaban (Eliquis).   3. Obesity Body mass index is 38.69 kg/m. Lifestyle modification was discussed at length including regular exercise and weight  reduction.  4. VF S/p ICD, followed by Dr Rayann Heman and the device clinic.  5. CAD No anginal symptoms.  6. Chronic systolic  CHF/ICM EF 35-40% No signs or symptoms of fluid overload.  7. HTN Stable, no changes today.   Follow up in the AF clinic in one month.    [facility] [address] [place], [place] [address] [phone] 06/16/2020 9:07 AM

## 2020-06-25 DIAGNOSIS — Z23 Encounter for immunization: Secondary | ICD-10-CM | POA: Diagnosis not present

## 2020-07-20 NOTE — Progress Notes (Signed)
Primary Care Physician: Orpah Melter, MD Primary Cardiologist: Dr Meda Coffee Primary Electrophysiologist: Dr Rayann Heman Referring Physician: Device Clinic   [name] is a 79 y.o. 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 The patient was initially diagnosed with atrial fibrillation in 2018 on device interrogation. Patient has a CHADS2VASC score of 5. His afib burden has been very low and anticoagulation was not started. The device clinic received an alert for a prolonged episode of afib lasting ~6 hours. Patient states his wife had fallen and required emergency treatment just before the onset of his afib. He denies any awareness of his arrhythmia.   On follow up today, patient reports he has done well since his last visit. He reports he has been able to increase his physical activity. He denies any bleeding issues on anticoagulation.   Today, he denies symptoms of palpitations, chest pain, shortness of breath, orthopnea, PND, lower extremity edema, dizziness, presyncope, syncope, snoring, daytime somnolence, bleeding, or neurologic sequela. The patient is tolerating medications without difficulties and is otherwise without complaint today.    Atrial Fibrillation Risk Factors:  he does not have symptoms or diagnosis of sleep apnea. he does not have a history of rheumatic fever.   he has a BMI of Body mass index is 38.44 kg/m.Marland Kitchen Filed Weights   07/21/20 0831  Weight: 125 kg    Family History  Problem Relation Age of Onset  . Cancer Mother        breast  . Lupus Mother   . Cancer Father        prostate  . Stroke Sister   . Thyroid disease Sister   . Heart attack Neg Hx   . Hypertension Neg Hx      Atrial Fibrillation Management history:  Previous antiarrhythmic drugs: none Previous  cardioversions: none Previous ablations: none CHADS2VASC score: 5 Anticoagulation history: Eliquis   Past Medical History:  Diagnosis Date  . Acid reflux   . CAD (coronary artery disease)    PCI to Circ with DES on 04/06/15  . CHF (congestive heart failure) ([place])   . Degenerative disc disease, thoracic   . Hyperlipidemia   . Hyperplastic colon polyp   . LBBB (left bundle branch block)   . Merkel cell cancer ([place]) 05/2005; 07-2005   s/p gluteal resection, lymph node dissection, chemo/ radiation  . Nonischemic cardiomyopathy ([place])    a. s/p STJ CRTD 07/2015  . Obesity   . Paroxysmal atrial fibrillation ([facility])    a. identified on device interrogation. All episodes <3 hours  . Ventricular fibrillation ([place])    a. appropriate ICD therapy 02/2017   Past Surgical History:  Procedure Laterality Date  . CARDIAC CATHETERIZATION N/A 04/06/2015   Procedure: Right/Left Heart Cath and Coronary Angiography;  Surgeon: Larey Dresser, MD;  Location: [facility];  Service: Cardiovascular;  Laterality: N/A;  . CARDIAC CATHETERIZATION N/A 04/06/2015   Procedure: Coronary Stent Intervention;  Surgeon: Peter M Martinique, MD;  Location: [facility];  Service: Cardiovascular;  Laterality: N/A;  . CATARACT EXTRACTION W/ INTRAOCULAR LENS  IMPLANT, BILATERAL Bilateral 11/2014  . COLONOSCOPY  2009  . EP IMPLANTABLE DEVICE N/A 08/04/2015   STJ CRTD implanted by Dr Rayann Heman for primary prevention/CHF  . KNEE CARTILAGE SURGERY Left 1960  . SKIN CANCER EXCISION  05/2005; 07/2005   excision of merkel cell  . TONSILLECTOMY      Current Outpatient Medications  Medication Sig Dispense Refill  . acetaminophen (TYLENOL) 325 MG tablet Take 650 mg by mouth every 6 (six) hours as needed for mild pain or moderate pain.    Marland Kitchen apixaban (ELIQUIS) 5 MG TABS tablet Take 1 tablet (5 mg total) by mouth 2 (two) times daily. 60 tablet 3  . atorvastatin (LIPITOR) 80 MG tablet Take 1 tablet (80 mg total) by mouth daily.  Please keep upcoming appt in October with Dr. Meda Coffee for future refills. Thank you 90 tablet 0  . carvedilol (COREG) 12.5 MG tablet TAKE 1 TABLET (12.5 MG TOTAL) BY MOUTH 2 (TWO) TIMES DAILY WITH A MEAL. 180 tablet 3  . fluorouracil (EFUDEX) 5 % cream Apply 5 application topically daily as needed (Patches on face).   0  . furosemide (LASIX) 20 MG tablet TAKE 1 TAB BY MOUTH 2 TIMES DAILY. 180 tablet 3  . ibuprofen (ADVIL,MOTRIN) 200 MG tablet Take 200 mg by mouth 2 (two) times daily as needed (pain).    Marland Kitchen levothyroxine (SYNTHROID, LEVOTHROID) 50 MCG tablet Take 50 mcg by mouth daily.  5  . losartan (COZAAR) 25 MG tablet TAKE 1 TABLET BY MOUTH EVERY DAY 90 tablet 3  . POTASSIUM PO Take 550 mg by mouth daily as needed (Potassium supplement).     . SODIUM FLUORIDE 5000 PPM 1.1 % PSTE Take by mouth.    . spironolactone (ALDACTONE) 25 MG tablet Take 0.5 tablets (12.5 mg total) by mouth daily. Please make yearly appt with Dr. Meda Coffee for October for future refills. 1st attempt 45 tablet 0   No current facility-administered medications for this encounter.   Facility-Administered Medications Ordered in Other Encounters  Medication Dose Route Frequency Provider Last Rate Last Admin  . adenosine (diagnostic) (ADENOSCAN) infusion 68.4 mg  0.56 mg/kg Intravenous Once Lelon Perla, MD        Allergies  Allergen Reactions  . Tape Other (See Comments)    Rash    Social History   Socioeconomic History  . Marital status: Married    Spouse name: bridgette  . Number of children: 2  . Years of education: college  . Highest education level: Not on file  Occupational History  . 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 Smoking status: Former Smoker    Packs/day: 0.25    Years: 15.00    Pack years: 3.75    Types: Cigarettes    Quit date: 10/25/1983    Years since quitting: 36.7  . Smokeless tobacco: Never Used  Vaping Use  . Vaping Use: Never used  Substance and Sexual Activity  .  Alcohol use: Yes    Alcohol/week: 1.0 standard drink    Types: 1 Cans of beer per week    Comment: 08/04/2015 "I doubt if I average a glass of wine and 1 beer q week"  . Drug use: No  . Sexual activity: Not Currently  Other Topics Concern  . Not on file  Social History Narrative   Pt lives in [place], married x 45 years.  2 grown sons have HTN.   Works as a Higher education careers adviser business)   Social Determinants of Radio broadcast assistant Strain:   . Difficulty of Paying Living Expenses: Not on file  Food Insecurity:   . Worried About Charity fundraiser in the Last Year: Not on file  . Ran  Out of Food in the Last Year: Not on file  Transportation Needs:   . Lack of Transportation (Medical): Not on file  . Lack of Transportation (Non-Medical): Not on file  Physical Activity:   . Days of Exercise per Week: Not on file  . Minutes of Exercise per Session: Not on file  Stress:   . Feeling of Stress : Not on file  Social Connections:   . Frequency of Communication with Friends and Family: Not on file  . Frequency of Social Gatherings with Friends and Family: Not on file  . Attends Religious Services: Not on file  . Active Member of Clubs or Organizations: Not on file  . Attends Archivist Meetings: Not on file  . Marital Status: Not on file  Intimate Partner Violence:   . Fear of Current or Ex-Partner: Not on file  . Emotionally Abused: Not on file  . Physically Abused: Not on file  . Sexually Abused: Not on file     ROS- All systems are reviewed and negative except as per the HPI above.  Physical Exam: Vitals:   07/21/20 0831  BP: 108/62  Pulse: 72  Weight: 125 kg  Height: 5\' 11"  (1.803 m)    GEN- The patient is well appearing obese elderly male, alert and oriented x 3 today.   HEENT-head normocephalic, atraumatic, sclera clear, conjunctiva pink, hearing intact, trachea midline. Lungs- Clear to ausculation bilaterally, normal work of  breathing Heart- Regular rate and rhythm, no murmurs, rubs or gallops  GI- soft, NT, ND, + BS Extremities- no clubbing, cyanosis, or edema MS- no significant deformity or atrophy Skin- no rash or lesion Psych- euthymic mood, full affect Neuro- strength and sensation are intact   Wt Readings from Last 3 Encounters:  07/21/20 125 kg  06/16/20 125.8 kg  09/25/19 120.7 kg    EKG today demonstrates A sense V paced rhythm HR 72, PVC, PR 178, QRS 194, QTc 512  Echo 03/08/17 demonstrated  - Left ventricle: Diffuse hypokinesis with abnormal septal motion.  The cavity size was moderately dilated. Wall thickness was  increased in a pattern of moderate LVH. Systolic function was  moderately reduced. The estimated ejection fraction was in the  range of 35% to 40%. Doppler parameters are consistent with both  elevated ventricular end-diastolic filling pressure and elevated  left atrial filling pressure.  - Mitral valve: There was mild regurgitation.  - Left atrium: The atrium was moderately dilated.  - Atrial septum: No defect or patent foramen ovale was identified.  - Pulmonary arteries: PA peak pressure: 33 mm Hg (S).   Epic records are reviewed at length today  CHA2DS2-VASc Score = 5  The patient's score is based upon: CHF History: 1 HTN History: 1 Age : 2 Diabetes History: 0 Stroke History: 0 Vascular Disease History: 1 Gender: 0      ASSESSMENT AND PLAN: 1. Paroxysmal Atrial Fibrillation (ICD10:  I48.0) The patient's CHA2DS2-VASc score is 5, indicating a 7.2% annual risk of stroke.   Continue Eliquis 5 mg BID. Check CBC today. Continue Coreg 12.5 mg BID Follow burden on device.   2. Secondary Hypercoagulable State (ICD10:  D68.69) The patient is at significant risk for stroke/thromboembolism based upon his CHA2DS2-VASc Score of 5.  Start Apixaban (Eliquis).   3. Obesity Body mass index is 38.44 kg/m. Lifestyle modification was discussed and encouraged  including regular physical activity and weight reduction.  4. VF S/p ICD, followed by Dr Rayann Heman and the device clinic.  5. CAD No anginal symptoms.  6. Chronic systolic CHF/ICM EF 88-30% No signs or symptoms of fluid overload.  7. HTN Stable, no changes today.   Follow up with Dr Rayann Heman and Dr Meda Coffee per recall.    [facility] [address] [place], [place] [address] [phone] 07/21/2020 8:56 AM

## 2020-07-21 ENCOUNTER — Other Ambulatory Visit: Payer: Self-pay

## 2020-07-21 ENCOUNTER — Encounter (HOSPITAL_COMMUNITY): Payer: Self-pay | Admitting: Physician Assistant

## 2020-07-21 ENCOUNTER — Ambulatory Visit (HOSPITAL_COMMUNITY)
Admission: RE | Admit: 2020-07-21 | Discharge: 2020-07-21 | Disposition: A | Discharge status: Home / Self Care | Payer: Medicare HMO | Source: Ambulatory Visit | Attending: Physician Assistant | Admitting: Physician Assistant

## 2020-07-21 VITALS — BP 108/62 | HR 72 | Ht 71.0 in | Wt 275.6 lb

## 2020-07-21 DIAGNOSIS — I428 Other cardiomyopathies: Secondary | ICD-10-CM | POA: Insufficient documentation

## 2020-07-21 DIAGNOSIS — Z6838 Body mass index (BMI) 38.0-38.9, adult: Secondary | ICD-10-CM | POA: Diagnosis not present

## 2020-07-21 DIAGNOSIS — I2510 Atherosclerotic heart disease of native coronary artery without angina pectoris: Secondary | ICD-10-CM | POA: Diagnosis not present

## 2020-07-21 DIAGNOSIS — D6869 Other thrombophilia: Secondary | ICD-10-CM | POA: Diagnosis not present

## 2020-07-21 DIAGNOSIS — Z95810 Presence of automatic (implantable) cardiac defibrillator: Secondary | ICD-10-CM | POA: Insufficient documentation

## 2020-07-21 DIAGNOSIS — Z87891 Personal history of nicotine dependence: Secondary | ICD-10-CM | POA: Diagnosis not present

## 2020-07-21 DIAGNOSIS — E785 Hyperlipidemia, unspecified: Secondary | ICD-10-CM | POA: Insufficient documentation

## 2020-07-21 DIAGNOSIS — E669 Obesity, unspecified: Secondary | ICD-10-CM | POA: Diagnosis not present

## 2020-07-21 DIAGNOSIS — Z888 Allergy status to other drugs, medicaments and biological substances status: Secondary | ICD-10-CM | POA: Diagnosis not present

## 2020-07-21 DIAGNOSIS — Z79899 Other long term (current) drug therapy: Secondary | ICD-10-CM | POA: Insufficient documentation

## 2020-07-21 DIAGNOSIS — I110 Hypertensive heart disease with heart failure: Secondary | ICD-10-CM | POA: Diagnosis not present

## 2020-07-21 DIAGNOSIS — I5022 Chronic systolic (congestive) heart failure: Secondary | ICD-10-CM | POA: Diagnosis not present

## 2020-07-21 DIAGNOSIS — I480 Paroxysmal atrial fibrillation: Secondary | ICD-10-CM | POA: Insufficient documentation

## 2020-07-21 DIAGNOSIS — Z7901 Long term (current) use of anticoagulants: Secondary | ICD-10-CM | POA: Diagnosis not present

## 2020-07-21 LAB — CBC
HCT: 40.2 % (ref 39.0–52.0)
Hemoglobin: 13.1 g/dL (ref 13.0–17.0)
MCH: 33.3 pg (ref 26.0–34.0)
MCHC: 32.6 g/dL (ref 30.0–36.0)
MCV: 102.3 fL — ABNORMAL HIGH (ref 80.0–100.0)
Platelets: 124 10*3/uL — ABNORMAL LOW (ref 150–400)
RBC: 3.93 MIL/uL — ABNORMAL LOW (ref 4.22–5.81)
RDW: 12.9 % (ref 11.5–15.5)
WBC: 4 10*3/uL (ref 4.0–10.5)
nRBC: 0 % (ref 0.0–0.2)

## 2020-07-27 DIAGNOSIS — G4733 Obstructive sleep apnea (adult) (pediatric): Secondary | ICD-10-CM | POA: Diagnosis not present

## 2020-08-10 ENCOUNTER — Other Ambulatory Visit: Payer: Self-pay | Admitting: Cardiology

## 2020-08-10 DIAGNOSIS — Z95810 Presence of automatic (implantable) cardiac defibrillator: Secondary | ICD-10-CM

## 2020-08-10 DIAGNOSIS — I2510 Atherosclerotic heart disease of native coronary artery without angina pectoris: Secondary | ICD-10-CM

## 2020-08-10 DIAGNOSIS — I5022 Chronic systolic (congestive) heart failure: Secondary | ICD-10-CM

## 2020-08-10 DIAGNOSIS — I447 Left bundle-branch block, unspecified: Secondary | ICD-10-CM

## 2020-08-10 DIAGNOSIS — E785 Hyperlipidemia, unspecified: Secondary | ICD-10-CM

## 2020-08-10 DIAGNOSIS — R059 Cough, unspecified: Secondary | ICD-10-CM

## 2020-08-10 DIAGNOSIS — I519 Heart disease, unspecified: Secondary | ICD-10-CM

## 2020-08-10 DIAGNOSIS — I428 Other cardiomyopathies: Secondary | ICD-10-CM

## 2020-08-11 NOTE — Telephone Encounter (Signed)
*  STAT* If patient is at the pharmacy, call can be transferred to refill team.   1. Which medications need to be refilled? (please list name of each medication and dose if known) Spironolactone  2. Which pharmacy/location (including street and city if local pharmacy) is medication to be sent to? [facility] [place], [place]   3. Do they need a 30 day or 90 day supply?  90 days and refills

## 2020-08-18 ENCOUNTER — Ambulatory Visit (INDEPENDENT_AMBULATORY_CARE_PROVIDER_SITE_OTHER): Payer: Medicare HMO

## 2020-08-18 DIAGNOSIS — I255 Ischemic cardiomyopathy: Secondary | ICD-10-CM

## 2020-08-18 DIAGNOSIS — I5022 Chronic systolic (congestive) heart failure: Secondary | ICD-10-CM

## 2020-08-19 LAB — CUP PACEART REMOTE DEVICE CHECK
Battery Remaining Longevity: 29 mo
Battery Remaining Percentage: 34 %
Battery Voltage: 2.92 V
Brady Statistic AP VP Percent: 11 %
Brady Statistic AP VS Percent: 1 %
Brady Statistic AS VP Percent: 78 %
Brady Statistic AS VS Percent: 4 %
Brady Statistic RA Percent Paced: 5 %
Date Time Interrogation Session: 20211026191735
HighPow Impedance: 72 Ohm
HighPow Impedance: 72 Ohm
Implantable Lead Implant Date: 20161011
Implantable Lead Implant Date: 20161011
Implantable Lead Implant Date: 20161011
Implantable Lead Location: 753858
Implantable Lead Location: 753860
Implantable Lead Location: 753860
Implantable Pulse Generator Implant Date: 20161011
Lead Channel Impedance Value: 300 Ohm
Lead Channel Impedance Value: 360 Ohm
Lead Channel Impedance Value: 630 Ohm
Lead Channel Pacing Threshold Amplitude: 0.5 V
Lead Channel Pacing Threshold Amplitude: 0.625 V
Lead Channel Pacing Threshold Amplitude: 0.75 V
Lead Channel Pacing Threshold Pulse Width: 0.5 ms
Lead Channel Pacing Threshold Pulse Width: 0.5 ms
Lead Channel Pacing Threshold Pulse Width: 0.8 ms
Lead Channel Sensing Intrinsic Amplitude: 2.4 mV
Lead Channel Sensing Intrinsic Amplitude: 9.2 mV
Lead Channel Setting Pacing Amplitude: 1.5 V
Lead Channel Setting Pacing Amplitude: 2 V
Lead Channel Setting Pacing Amplitude: 2 V
Lead Channel Setting Pacing Pulse Width: 0.5 ms
Lead Channel Setting Pacing Pulse Width: 0.8 ms
Lead Channel Setting Sensing Sensitivity: 0.5 mV
Pulse Gen Serial Number: 7276664

## 2020-08-24 ENCOUNTER — Other Ambulatory Visit: Payer: Self-pay | Admitting: Cardiology

## 2020-08-24 ENCOUNTER — Encounter: Payer: Self-pay | Admitting: Physician Assistant

## 2020-08-24 DIAGNOSIS — R059 Cough, unspecified: Secondary | ICD-10-CM

## 2020-08-24 DIAGNOSIS — E785 Hyperlipidemia, unspecified: Secondary | ICD-10-CM

## 2020-08-24 DIAGNOSIS — I447 Left bundle-branch block, unspecified: Secondary | ICD-10-CM

## 2020-08-24 DIAGNOSIS — I5022 Chronic systolic (congestive) heart failure: Secondary | ICD-10-CM

## 2020-08-24 DIAGNOSIS — Z95810 Presence of automatic (implantable) cardiac defibrillator: Secondary | ICD-10-CM

## 2020-08-24 DIAGNOSIS — I428 Other cardiomyopathies: Secondary | ICD-10-CM

## 2020-08-24 DIAGNOSIS — I519 Heart disease, unspecified: Secondary | ICD-10-CM

## 2020-08-24 DIAGNOSIS — I2510 Atherosclerotic heart disease of native coronary artery without angina pectoris: Secondary | ICD-10-CM

## 2020-08-24 MED ORDER — SPIRONOLACTONE 25 MG PO TABS: 12.5000 mg | ORAL_TABLET | Freq: Every day | ORAL | 0 refills | Status: DC

## 2020-08-24 NOTE — Progress Notes (Signed)
Remote ICD transmission.   

## 2020-08-24 NOTE — Progress Notes (Addendum)
Cardiology Office Note    Date:  08/25/2020   ID:  Mishon, Blubaugh 08-Feb-1941, MRN [id number]  PCP:  Orpah Melter, MD  Cardiologist:  Ena Dawley, MD  Electrophysiologist:  [name], MD   Chief Complaint: f/u CHF  History of Present Illness:   [name] is a 79 y.o. male with history of mixed cardiomyopathy/chronic systolic CHF, [employer]. Jude BiV ICD implantation 08/2015,  CAD s/p DES to LCx 2016, left bundle branch block, PVCs, VF (ICD therapy 2018), PVCs, PAF, obesity, hypertension, hyperlipidemia, bilateral lymphedema s/p treatment, Merkel cell cancer status post chemo/radiation, acid reflux, mild chronic appearing thrombocytopenia who presents for follow-up.  The patient initially established care with Dr. Meda Coffee in 2016 for shortness of breath.  Stress test showed no ischemia but 2D echocardiogram had shown LV dysfunction EF 30 to 35%.  He underwent cardiac catheterization with staged PCI to the circumflex 03/2015.  Dr. Aundra Dubin felt that his CAD could be contributing to his cardiomyopathy though was not the sole cause. cMRI showed diffuse HK, mild MR, "and mid inferolateral wall and partially of the basal and mid inferior wall associated with 75-100% late gadolinium enhancement consistent with prior myocardial infarction and no change of recovery if revascularized."  Despite PCI and medication optimization, his LV dysfunction persisted so he has had a BiV ICD implanted.  He also has a history of VF/PVCs with previous commentary from Dr. Rayann Heman that if he had recurrence, could consider sotalol versus ablation.  He has also had atrial fibrillation observed on his device and has been following with the A. fib clinic.  He was subsequently started on Eliquis.  Last echo 02/2017 showed EF 35 to 40%, mild mitral regurgitation, moderately dilated left atrium.  He is seen for follow-up today. He denies any chest pain but reports increase in DOE that has been present for about 6 months.  He is able to complete ADLs but if he exerts himself beyond normal activity he develops some SOB. He has not been as active this year. He has chronic R>L lower extremity edema after having those lymph nodes removed. He denies any weight gain. He is trying to lose weight. No orthopnea, PND, chest pain, syncope or ICD shocks. He needs a refill on spironolactone.   Labwork independently reviewed: 06/2020 Hgb 13.1, plt 124, K 4.0, Cr 1.16 07/2019 TSH wnl, LDL 37, LFTs OK   Past Medical History:  Diagnosis Date  . Acid reflux   . Biventricular ICD (implantable cardioverter-defibrillator) in place   . CAD (coronary artery disease)    a. PCI to Circ with DES on 04/06/15.  . Cardiomyopathy ([place])    a. mixed picture - suspected NICM but cannot exclude contribution from CAD.  Marland Kitchen Chronic systolic CHF (congestive heart failure) ([place])   . Degenerative disc disease, thoracic   . Essential hypertension   . Hyperlipidemia   . Hyperplastic colon polyp   . LBBB (left bundle branch block)   . Merkel cell cancer ([place]) 05/2005; 07-2005   s/p gluteal resection, lymph node dissection, chemo/ radiation  . Nonischemic cardiomyopathy ([place])    a. s/p STJ CRTD 07/2015  . Obesity   . PAF (paroxysmal atrial fibrillation) ([place])   . Paroxysmal atrial fibrillation ([facility])    a. identified on device interrogation. All episodes <3 hours  . PVC's (premature ventricular contractions)   . Ventricular fibrillation ([place])    a. appropriate ICD therapy 02/2017    Past Surgical History:  Procedure Laterality Date  .  CARDIAC CATHETERIZATION N/A 04/06/2015   Procedure: Right/Left Heart Cath and Coronary Angiography;  Surgeon: Larey Dresser, MD;  Location: [facility];  Service: Cardiovascular;  Laterality: N/A;  . CARDIAC CATHETERIZATION N/A 04/06/2015   Procedure: Coronary Stent Intervention;  Surgeon: Peter M Martinique, MD;  Location: [facility];  Service: Cardiovascular;  Laterality: N/A;  . CATARACT EXTRACTION  W/ INTRAOCULAR LENS  IMPLANT, BILATERAL Bilateral 11/2014  . COLONOSCOPY  2009  . EP IMPLANTABLE DEVICE N/A 08/04/2015   STJ CRTD implanted by Dr Rayann Heman for primary prevention/CHF  . KNEE CARTILAGE SURGERY Left 1960  . SKIN CANCER EXCISION  05/2005; 07/2005   excision of merkel cell  . TONSILLECTOMY      Current Medications: Current Meds  Medication Sig  . acetaminophen (TYLENOL) 325 MG tablet Take 650 mg by mouth every 6 (six) hours as needed for mild pain or moderate pain.  Marland Kitchen apixaban (ELIQUIS) 5 MG TABS tablet Take 1 tablet (5 mg total) by mouth 2 (two) times daily.  Marland Kitchen atorvastatin (LIPITOR) 80 MG tablet Take 1 tablet (80 mg total) by mouth daily. Please keep upcoming appt in October with Dr. Meda Coffee for future refills. Thank you  . carvedilol (COREG) 12.5 MG tablet TAKE 1 TABLET (12.5 MG TOTAL) BY MOUTH 2 (TWO) TIMES DAILY WITH A MEAL.  . fluorouracil (EFUDEX) 5 % cream Apply 5 application topically daily as needed (Patches on face).   . furosemide (LASIX) 20 MG tablet TAKE 1 TAB BY MOUTH 2 TIMES DAILY.  Marland Kitchen ibuprofen (ADVIL,MOTRIN) 200 MG tablet Take 200 mg by mouth 2 (two) times daily as needed (pain).  Marland Kitchen levothyroxine (SYNTHROID, LEVOTHROID) 50 MCG tablet Take 50 mcg by mouth daily.  Marland Kitchen losartan (COZAAR) 25 MG tablet TAKE 1 TABLET BY MOUTH EVERY DAY  . POTASSIUM PO Take 550 mg by mouth daily as needed (Potassium supplement).   . SODIUM FLUORIDE 5000 PPM 1.1 % PSTE Take by mouth.  . spironolactone (ALDACTONE) 25 MG tablet Take 0.5 tablets (12.5 mg total) by mouth daily. Please make overdue appt with Dr. Meda Coffee before anymore refills. 2nd attempt   Allergies:   Tape   Social History   Socioeconomic History  . Marital status: Married    Spouse name: bridgette  . Number of children: 2  . Years of education: college  . Highest education level: Not on file  Occupational History  . 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 Smoking status: Former Smoker    Packs/day: 0.25     Years: 15.00    Pack years: 3.75    Types: Cigarettes    Quit date: 10/25/1983    Years since quitting: 36.8  . Smokeless tobacco: Never Used  Vaping Use  . Vaping Use: Never used  Substance and Sexual Activity  . Alcohol use: Yes    Alcohol/week: 1.0 standard drink    Types: 1 Cans of beer per week    Comment: 08/04/2015 "I doubt if I average a glass of wine and 1 beer q week"  . Drug use: No  . Sexual activity: Not Currently  Other Topics Concern  . Not on file  Social History Narrative   Pt lives in [place], married x 45 years.  2 grown sons have HTN.   Works as a Higher education careers adviser business)   Social Determinants of Radio broadcast assistant Strain:   . Difficulty of Paying Living Expenses: Not on file  Food Insecurity:   .  Worried About Charity fundraiser in the Last Year: Not on file  . Ran Out of Food in the Last Year: Not on file  Transportation Needs:   . Lack of Transportation (Medical): Not on file  . Lack of Transportation (Non-Medical): Not on file  Physical Activity:   . Days of Exercise per Week: Not on file  . Minutes of Exercise per Session: Not on file  Stress:   . Feeling of Stress : Not on file  Social Connections:   . Frequency of Communication with Friends and Family: Not on file  . Frequency of Social Gatherings with Friends and Family: Not on file  . Attends Religious Services: Not on file  . Active Member of Clubs or Organizations: Not on file  . Attends Archivist Meetings: Not on file  . Marital Status: Not on file     Family History:  The patient's family history includes Cancer in his father and mother; Lupus in his mother; Stroke in his sister; Thyroid disease in his sister. There is no history of Heart attack or Hypertension.  ROS:   Please see the history of present illness.  All other systems are reviewed and otherwise negative.    EKGs/Labs/Other Studies Reviewed:    Studies reviewed are outlined and  summarized above. Reports included below if pertinent.  2D Echo 02/2017 - Left ventricle: Diffuse hypokinesis with abnormal septal motion.  The cavity size was moderately dilated. Wall thickness was  increased in a pattern of moderate LVH. Systolic function was  moderately reduced. The estimated ejection fraction was in the  range of 35% to 40%. Doppler parameters are consistent with both  elevated ventricular end-diastolic filling pressure and elevated  left atrial filling pressure.  - Mitral valve: There was mild regurgitation.  - Left atrium: The atrium was moderately dilated.  - Atrial septum: No defect or patent foramen ovale was identified.  - Pulmonary arteries: PA peak pressure: 33 mm Hg (S).   MRI 2016 CLINICAL DATA:  79 year old male with cardiomyopathy of unknown etiology. H/o chemotherapy and a scar seen on Lexiscan nuclears stress test. LVEF 10-15%. LBBB.  EXAM: CARDIAC MRI  TECHNIQUE: The patient was scanned on a 1.5 Tesla GE magnet. A dedicated cardiac coil was used. Functional imaging was done using Fiesta sequences. 2,3, and 4 chamber views were done to assess for RWMA's. Modified Simpson's rule using a short axis stack was used to calculate an ejection fraction on a dedicated work Conservation officer, nature. The patient received 35 cc of Multihance. After 10 minutes inversion recovery sequences were used to assess for infiltration and scar tissue.  CONTRAST:  35 cc  of Multihance  FINDINGS: 1. There is severe left ventricular dilatation with normal wall thickness and severely decreased systolic function (LVEF = 18%).  There is severe diffuse hypokinesis with akinesis of the basal and mid inferolateral and inferior walls and severe left ventricular dyssynchrony.  LVEDD:  85 mm  LVESD:  80 mm  LVEDV:  354 ml  LVESV:  290 ml  SV:  64 ml  CO:  4.5 L/minute  Myocardial mass; 274 g  2. Normal right ventricular size,  thickness and borderline systolic function (RVEF = 44%).  RVEDV:  135 ml  RVESV:  76 ml  3.  There is normal right and left atrial size.  4. Mild mitral regurgitation. Trivial aortic and tricuspid regurgitation.  5. There is thinning of the basal and mid inferolateral wall and  partially of the basal and mid inferior wall associated with 75-100% late gadolinium enhancement consistent with prior myocardial infarction and no change of recovery if revascularized.  IMPRESSION: 1. There is severe left ventricular dilatation with normal wall thickness and severely decreased systolic function (LVEF = 18%).  There is severe diffuse hypokinesis with akinesis of the basal and mid inferolateral and inferior walls and severe left ventricular dyssynchrony.  2. Normal right ventricular size, thickness and borderline systolic function (RVEF = 44%).  3. Mild mitral regurgitation. Trivial aortic and tricuspid regurgitation.  4. There is thinning of the basal and mid inferolateral wall and partially of the basal and mid inferior wall associated with 75-100% late gadolinium enhancement consistent with prior myocardial infarction and no change of recovery if revascularized.  Ena Dawley   Electronically Signed   By: Ena Dawley   On: 03/13/2015 19:27     EKG:  EKG is not ordered today but reviewed recent tracing 07/21/20 atrial sensed V paced rhythm with occasional PVCs   Recent Labs: 06/16/2020: BUN 23; Creatinine, Ser 1.16; Potassium 4.0; Sodium 139; TSH 2.327 07/21/2020: Hemoglobin 13.1; Platelets 124  Recent Lipid Panel    Component Value Date/Time   CHOL 99 (L) 07/30/2019 0820   TRIG 54 07/30/2019 0820   HDL 49 07/30/2019 0820   CHOLHDL 2.0 07/30/2019 0820   CHOLHDL 1.9 05/04/2016 1001   VLDL 10 05/04/2016 1001   LDLCALC 37 07/30/2019 0820    PHYSICAL EXAM:    VS:  BP 118/70   Pulse 88   Ht 5\' 11"  (1.803 m)   Wt 271 lb (122.9 kg)   SpO2 96%    BMI 37.80 kg/m   BMI: Body mass index is 37.8 kg/m.  GEN: Well nourished, well developed WM, in no acute distress, habitus c/w obesity HEENT: normocephalic, atraumatic Neck: no JVD, carotid bruits, or masses Cardiac: RRR; no murmurs, rubs, or gallops, soft nonpitting asymmetry of lower extremities with larger RLE/edema, marginal edema of the LLE extremity Respiratory:  Diffusely diminished bilaterally without wheezes/rales/rhonchi, normal work of breathing GI: soft, nontender, nondistended, + BS MS: no deformity or atrophy Skin: warm and dry, no rash Neuro:  Alert and Oriented x 3, Strength and sensation are intact, follows commands Psych: euthymic mood, full affect  Wt Readings from Last 3 Encounters:  08/25/20 271 lb (122.9 kg)  07/21/20 275 lb 9.6 oz (125 kg)  06/16/20 277 lb 6.4 oz (125.8 kg)     ASSESSMENT & PLAN:   1. Dyspnea on exertion - etiology not clear at this time but suspect multifactorial in the context of deconditioning and high BMI superimposed on known LV dysfunction and coronary artery disease. Will obtain labwork today (CBC, CMET, BNP) and 2v CXR. Plan updated 2D echocardiogram and Lexiscan nuclear stress test. Risks, benefits and objective of test were reviewed with patient who is eager to proceed. Lexiscan chosen due to comorbidities. 2. CAD with hyperlipidemia goal LDL <70 - no chest pain. Not on ASA due to concomitant Eliquis. F/u Lexiscan as above. Will continue BB and statin. Check CMET/lipids today. 3. Chronic systolic CHF with mild mitral regurgitation - as above, noticing increased DOE. Body habitus and chronic edema (R>L) makes volume assessment challenging. Will continue present regimen and obtain workup as above. Consideration could be given to trial of titrated Lasix versus transition [facility]. 4. Paroxysmal atrial fib - Afib burden has been <1% so does not seem to be contributing to his recent DOE. Has been followed  by afib clinic as well as Dr.  Rayann Heman. Continue apixaban and f/u CBC today.  5. VF/PVCs - no palpitations or recent ICD shocks. Continue beta blocker. Trends and PVC burden is followed by defib checks. He has f/u with Dr. Rayann Heman in 09/2020 which we will keep.  Disposition: F/u with Dr. Nelson/general cards APP after above testing. Also keep f/u 09/2020 with Dr. Rayann Heman for EP as planned.  Medication Adjustments/Labs and Tests Ordered: Current medicines are reviewed at length with the patient today.  Concerns regarding medicines are outlined above. Medication changes, Labs and Tests ordered today are summarized above and listed in the Patient Instructions accessible in Encounters.   Signed, Charlie Pitter, PA-C  08/25/2020 12:31 PM    [facility] [address], [place], [place]  [address] Phone: [phone]; Fax: [phone]

## 2020-08-24 NOTE — Telephone Encounter (Signed)
*  STAT* If patient is at the pharmacy, call can be transferred to refill team.   1. Which medications need to be refilled? (please list name of each medication and dose if known) spironolactone (ALDACTONE) 25 MG tablet  2. Which pharmacy/location (including street and city if local pharmacy) is medication to be sent to? [id number] [place] [place] [address]  3. Do they need a 30 day or 90 day supply? [place]

## 2020-08-24 NOTE — Telephone Encounter (Signed)
[facility]

## 2020-08-25 ENCOUNTER — Ambulatory Visit: Payer: Medicare HMO | Admitting: Physician Assistant

## 2020-08-25 ENCOUNTER — Encounter: Payer: Self-pay | Admitting: Physician Assistant

## 2020-08-25 ENCOUNTER — Other Ambulatory Visit: Payer: Self-pay

## 2020-08-25 ENCOUNTER — Ambulatory Visit
Admission: RE | Admit: 2020-08-25 | Discharge: 2020-08-25 | Disposition: A | Discharge status: Home / Self Care | Payer: Medicare HMO | Source: Ambulatory Visit | Attending: Physician Assistant | Admitting: Physician Assistant

## 2020-08-25 VITALS — BP 118/70 | HR 88 | Ht 71.0 in | Wt 271.0 lb

## 2020-08-25 DIAGNOSIS — R0600 Dyspnea, unspecified: Secondary | ICD-10-CM

## 2020-08-25 DIAGNOSIS — I340 Nonrheumatic mitral (valve) insufficiency: Secondary | ICD-10-CM

## 2020-08-25 DIAGNOSIS — I4901 Ventricular fibrillation: Secondary | ICD-10-CM

## 2020-08-25 DIAGNOSIS — R0609 Other forms of dyspnea: Secondary | ICD-10-CM

## 2020-08-25 DIAGNOSIS — I2510 Atherosclerotic heart disease of native coronary artery without angina pectoris: Secondary | ICD-10-CM | POA: Diagnosis not present

## 2020-08-25 DIAGNOSIS — I5022 Chronic systolic (congestive) heart failure: Secondary | ICD-10-CM | POA: Diagnosis not present

## 2020-08-25 DIAGNOSIS — E785 Hyperlipidemia, unspecified: Secondary | ICD-10-CM

## 2020-08-25 DIAGNOSIS — Z8679 Personal history of other diseases of the circulatory system: Secondary | ICD-10-CM | POA: Diagnosis not present

## 2020-08-25 DIAGNOSIS — I480 Paroxysmal atrial fibrillation: Secondary | ICD-10-CM | POA: Diagnosis not present

## 2020-08-25 DIAGNOSIS — J984 Other disorders of lung: Secondary | ICD-10-CM | POA: Diagnosis not present

## 2020-08-25 MED ORDER — SPIRONOLACTONE 25 MG PO TABS: 12.5000 mg | ORAL_TABLET | Freq: Every day | ORAL | 3 refills | Status: DC

## 2020-08-25 MED ORDER — CARVEDILOL 12.5 MG PO TABS: 12.5000 mg | ORAL_TABLET | Freq: Two times a day (BID) | ORAL | 3 refills | Status: DC

## 2020-08-25 MED ORDER — FUROSEMIDE 20 MG PO TABS: ORAL_TABLET | ORAL | 3 refills | Status: DC

## 2020-08-25 NOTE — Patient Instructions (Addendum)
Medication Instructions:  Your physician recommends that you continue on your current medications as directed. Please refer to the Current Medication list given to you today.  *If you need a refill on your cardiac medications before your next appointment, please call your pharmacy*   Lab Work: TODAY:  CMET, LIPID, PRO BNP, & CBC  If you have labs (blood work) drawn today and your tests are completely normal, you will receive your results only by: Marland Kitchen MyChart Message (if you have MyChart) OR . A paper copy in the mail If you have any lab test that is abnormal or we need to change your treatment, we will call you to review the results.   Testing/Procedures: A chest x-ray takes a picture of the organs and structures inside the chest, including the heart, lungs, and blood vessels. This test can show several things, including, whether the heart is enlarges; whether fluid is building up in the lungs; and whether pacemaker / defibrillator leads are still in place.  Go to [place] before 4:30  [address] [place], [place] [address] [phone]   Your physician has requested that you have an echocardiogram. Echocardiography is a painless test that uses sound waves to create images of your heart. It provides your doctor with information about the size and shape of your heart and how well your heart's chambers and valves are working. This procedure takes approximately one hour. There are no restrictions for this procedure.   Your physician has requested that you have a lexiscan myoview. For further information please visit [email or web address]. Please follow instruction sheet,BELOW    You are scheduled for a Myocardial Perfusion Imaging Study  Please arrive 15 minutes prior to your appointment time for registration and insurance purposes.  The test will take approximately 3 to 4 hours to complete; you may bring reading material.  If someone comes with you to your  appointment, they will need to remain in the main lobby due to limited space in the testing area. **If you are pregnant or breastfeeding, please notify the nuclear lab prior to your appointment**  How to prepare for your Myocardial Perfusion Test: . Do not eat or drink 3 hours prior to your test, except you may have water. . Do not consume products containing caffeine (regular or decaffeinated) 12 hours prior to your test. (ex: coffee, chocolate, sodas, tea). . Do bring a list of your current medications with you.  If not listed below, you may take your medications as normal. HOLD LASIX THE MORNING OF . Do wear comfortable clothes (no dresses or overalls) and walking shoes, tennis shoes preferred (No heels or open toe shoes are allowed). . Do NOT wear cologne, perfume, aftershave, or lotions (deodorant is allowed). . If these instructions are not followed, your test will have to be rescheduled.     Follow-Up: At [facility], you and your health needs are our priority.  As part of our continuing mission to provide you with exceptional heart care, we have created designated Provider Care Teams.  These Care Teams include your primary Cardiologist (physician) and Advanced Practice Providers (APPs -  Physician Assistants and Nurse Practitioners) who all work together to provide you with the care you need, when you need it.  We recommend signing up for the patient portal called "MyChart".  Sign up information is provided on this After Visit Summary.  MyChart is used to connect with patients for Virtual Visits (Telemedicine).  Patients are able to view lab/test  results, encounter notes, upcoming appointments, etc.  Non-urgent messages can be sent to your provider as well.   To learn more about what you can do with MyChart, go to [email or web address].    Your next appointment:   AFTER TESTING   The format for your next appointment:   In Person  Provider:   You may see Ena Dawley, MD or  one of the following Advanced Practice Providers on your designated Care Team:    Melina Copa, PA-C  Ermalinda Barrios, PA-C    Other Instructions

## 2020-08-26 ENCOUNTER — Telehealth: Payer: Self-pay | Admitting: *Deleted

## 2020-08-26 LAB — CBC
Hematocrit: 40.4 % (ref 37.5–51.0)
Hemoglobin: 13.7 g/dL (ref 13.0–17.7)
MCH: 33.8 pg — ABNORMAL HIGH (ref 26.6–33.0)
MCHC: 33.9 g/dL (ref 31.5–35.7)
MCV: 100 fL — ABNORMAL HIGH (ref 79–97)
Platelets: 133 10*3/uL — ABNORMAL LOW (ref 150–450)
RBC: 4.05 x10E6/uL — ABNORMAL LOW (ref 4.14–5.80)
RDW: 12.3 % (ref 11.6–15.4)
WBC: 3.9 10*3/uL (ref 3.4–10.8)

## 2020-08-26 LAB — LIPID PANEL
Chol/HDL Ratio: 2.2 ratio (ref 0.0–5.0)
Cholesterol, Total: 107 mg/dL (ref 100–199)
HDL: 48 mg/dL (ref >39)
LDL Chol Calc (NIH): 46 mg/dL (ref 0–99)
Triglycerides: 59 mg/dL (ref 0–149)
VLDL Cholesterol Cal: 13 mg/dL (ref 5–40)

## 2020-08-26 LAB — COMPREHENSIVE METABOLIC PANEL
ALT: 19 IU/L (ref 0–44)
AST: 20 IU/L (ref 0–40)
Albumin/Globulin Ratio: 2 (ref 1.2–2.2)
Albumin: 4.5 g/dL (ref 3.7–4.7)
Alkaline Phosphatase: 65 IU/L (ref 44–121)
BUN/Creatinine Ratio: 15 (ref 10–24)
BUN: 17 mg/dL (ref 8–27)
Bilirubin Total: 0.8 mg/dL (ref 0.0–1.2)
CO2: 24 mmol/L (ref 20–29)
Calcium: 9.3 mg/dL (ref 8.6–10.2)
Chloride: 103 mmol/L (ref 96–106)
Creatinine, Ser: 1.14 mg/dL (ref 0.76–1.27)
GFR calc Af Amer: 70 mL/min/{1.73_m2} (ref >59)
GFR calc non Af Amer: 61 mL/min/{1.73_m2} (ref >59)
Globulin, Total: 2.3 g/dL (ref 1.5–4.5)
Glucose: 99 mg/dL (ref 65–99)
Potassium: 4.3 mmol/L (ref 3.5–5.2)
Sodium: 139 mmol/L (ref 134–144)
Total Protein: 6.8 g/dL (ref 6.0–8.5)

## 2020-08-26 LAB — PRO B NATRIURETIC PEPTIDE: NT-Pro BNP: 934 pg/mL — ABNORMAL HIGH (ref 0–486)

## 2020-08-26 MED ORDER — FUROSEMIDE 20 MG PO TABS: ORAL_TABLET | ORAL | 3 refills | Status: DC

## 2020-08-26 NOTE — Telephone Encounter (Signed)
-----   Message from Charlie Pitter, [place] sent at 08/26/2020  7:58 AM EDT ----- Labs look stable. Cholesterol is good. Blood count stable. Platelets are slightly decreased but stable compared to prior. His BNP is slightly elevated. He is taking Lasix 20mg  BID. Let's increase to 40mg  BID x 3 days then return to 20mg  BID and see how he feels. Remind pt to limit sodium to 2000mg  daily and fluids to 64oz per day. Await testing and we'll go from there.

## 2020-08-26 NOTE — Telephone Encounter (Signed)
Patient was returning a call to ask some follow-up questions about his lab work. Please advise

## 2020-09-01 ENCOUNTER — Ambulatory Visit: Payer: Medicare HMO | Attending: Internal Medicine

## 2020-09-01 ENCOUNTER — Other Ambulatory Visit (HOSPITAL_BASED_OUTPATIENT_CLINIC_OR_DEPARTMENT_OTHER): Payer: Self-pay | Admitting: Internal Medicine

## 2020-09-01 DIAGNOSIS — Z23 Encounter for immunization: Secondary | ICD-10-CM

## 2020-09-04 MED FILL — PFIZER-BIONTECH COVID-19 VA: 30 | 1 days supply | Qty: 0 | Fill #0

## 2020-09-09 ENCOUNTER — Telehealth (HOSPITAL_COMMUNITY): Payer: Self-pay

## 2020-09-09 NOTE — Telephone Encounter (Signed)
Spoke with the patient. He stated he understood and would be here for his test. Asked to call back with any questions. S.Jaren Kearn EMTP

## 2020-09-10 DIAGNOSIS — B029 Zoster without complications: Secondary | ICD-10-CM | POA: Diagnosis not present

## 2020-09-15 ENCOUNTER — Other Ambulatory Visit: Payer: Self-pay

## 2020-09-15 ENCOUNTER — Ambulatory Visit (HOSPITAL_BASED_OUTPATIENT_CLINIC_OR_DEPARTMENT_OTHER): Payer: Medicare HMO

## 2020-09-15 ENCOUNTER — Encounter: Payer: Self-pay | Admitting: Physician Assistant

## 2020-09-15 ENCOUNTER — Ambulatory Visit (HOSPITAL_COMMUNITY): Payer: Medicare HMO | Attending: Internal Medicine

## 2020-09-15 DIAGNOSIS — I480 Paroxysmal atrial fibrillation: Secondary | ICD-10-CM | POA: Insufficient documentation

## 2020-09-15 DIAGNOSIS — I340 Nonrheumatic mitral (valve) insufficiency: Secondary | ICD-10-CM

## 2020-09-15 DIAGNOSIS — R0600 Dyspnea, unspecified: Secondary | ICD-10-CM

## 2020-09-15 DIAGNOSIS — I4901 Ventricular fibrillation: Secondary | ICD-10-CM

## 2020-09-15 DIAGNOSIS — I2510 Atherosclerotic heart disease of native coronary artery without angina pectoris: Secondary | ICD-10-CM

## 2020-09-15 DIAGNOSIS — E785 Hyperlipidemia, unspecified: Secondary | ICD-10-CM | POA: Insufficient documentation

## 2020-09-15 DIAGNOSIS — R0609 Other forms of dyspnea: Secondary | ICD-10-CM

## 2020-09-15 DIAGNOSIS — I5022 Chronic systolic (congestive) heart failure: Secondary | ICD-10-CM | POA: Insufficient documentation

## 2020-09-15 LAB — MYOCARDIAL PERFUSION IMAGING
LV dias vol: 149 mL (ref 62–150)
LV sys vol: 105 mL
Peak HR: 142 {beats}/min
Rest HR: 85 {beats}/min
SDS: 0
SRS: 10
SSS: 11
TID: 1.06

## 2020-09-15 LAB — ECHOCARDIOGRAM COMPLETE
Area-P 1/2: 2.96 cm2
Height: 71 in
S' Lateral: 3.5 cm
Weight: 4336 oz

## 2020-09-15 MED ORDER — TECHNETIUM TC 99M TETROFOSMIN IV KIT
10.2000 | PACK | Freq: Once | INTRAVENOUS | Status: AC | PRN
  Administered 2020-09-15: 10.2 via INTRAVENOUS
  Filled 2020-09-15: qty 11

## 2020-09-15 MED ORDER — TECHNETIUM TC 99M TETROFOSMIN IV KIT
31.6000 | PACK | Freq: Once | INTRAVENOUS | Status: AC | PRN
  Administered 2020-09-15: 31.6 via INTRAVENOUS
  Filled 2020-09-15: qty 32

## 2020-09-15 MED ORDER — REGADENOSON 0.4 MG/5ML IV SOLN
0.4000 mg | Freq: Once | INTRAVENOUS | Status: AC
  Administered 2020-09-15: 0.4 mg via INTRAVENOUS

## 2020-09-15 MED ORDER — PERFLUTREN LIPID MICROSPHERE
1.0000 mL | INTRAVENOUS | Status: AC | PRN
  Administered 2020-09-15: 3 mL via INTRAVENOUS

## 2020-09-16 ENCOUNTER — Telehealth: Payer: Self-pay | Admitting: Cardiology

## 2020-09-16 NOTE — Telephone Encounter (Signed)
-----   Message from Charlie Pitter, [place] sent at 09/15/2020  1:06 PM EST ----- Nuc result also pending if you want to wait to call with both. Echo shows continued chronic weakness of heart muscle with EF 40-45% but actually slightly improved from prior - previously in the 35-40% range. Heart is mildly thick. Mild aortic dilation noted which is simply a finding we monitor periodically. Frequent PVCs noted during study, which is known for him - last burden was 6.7% in October. He has a follow-up with Dr. Rayann Heman in December with EP which will be important to see if perhaps the increased PVC burden is affecting his symptoms. Await nuclear stress test.

## 2020-09-16 NOTE — Telephone Encounter (Signed)
Patient is returning call to discuss echo results. °

## 2020-09-16 NOTE — Telephone Encounter (Signed)
Returned call to pt.  He has been made aware of his echocardiogram / stress test results. Per pt, he doesn't have a bp trend and his weight is 265-270 normally. Pt did advise that he didn't really tell a difference in the way he felt before increasing the Lasix for a couple of days and the way he felt during the increase in dose, and still feels the same way.  Pt seems to think that since he hadn't exercised in the last 1 1/2, has a lot to do with it.  I have advised pt to keep a bp log for the next 5 days, monitoring his bp 3 hours after morning meds, making sure he is seated in a chair for a few minutes and send [place] a mychart message with those readings on Monday. Pt agreed and will wait for the decision to be made in the next step.

## 2020-09-22 ENCOUNTER — Telehealth: Payer: Self-pay | Admitting: Cardiology

## 2020-09-22 NOTE — Telephone Encounter (Signed)
Patient called and wanted to know [facility] or has to go to an urgent care

## 2020-09-22 NOTE — Telephone Encounter (Signed)
Placed call to pt and here is his bp readings:  Pt also states that he has been battling a "cold", coughing, sore throat, coughing up stuff, etc. Since 11/27.  He is scheduled for a in office visit with Ermalinda Barrios, PA-C tomorrow, 09/23/20.Marland Kitchen Should we change to virtual?   Date  Bp  Rate  Time  11/25  112/69  71  11:30 a.m 11/26  106/68  79  11:10 a.m 11/27  119/73  81  3:30 p.m 11/28  109/70  85  2:30 p.m 11/29  119/71  86  7:00 p.m

## 2020-09-22 NOTE — Telephone Encounter (Signed)
Returned call to pt and he has been made aware that [facility], as his pcp don't do Covid testing.  Pt was very appreciative of the call back.

## 2020-09-22 NOTE — Telephone Encounter (Signed)
Called pt back.  His appointment has been cancelled and reschedule to see Melina Copa, PA-C in the [facility] location (pt is aware) He will contact his pcp or go to a urgent care for his symptoms today and will keep [place] posted.

## 2020-09-22 NOTE — Telephone Encounter (Signed)
I would reschedule the visit to a few weeks out to me if I have any availability - Dr. Meda Coffee and I have had plans to titrate his CHF medication (I.e. change ARB to [facility]) but don't want to make any of those changes while he is acutely ill. He needs to call primary care today or urgent care to be evaluated for Covid and strep since we are seeing a resurgence in the community. Please thank him for sharing these symptoms with [place]. He would be a candidate for monoclonal antibody therapy given his cardiac history if he tests positive for Covid and you have to arrange this within a certain timeframe of diagnosis, hence why testing while symptomatic is necessary. Keep [place] posted. Hope he feels better. Thanks. Bowyn Mercier PA-C

## 2020-09-22 NOTE — Progress Notes (Deleted)
Cardiology Office Note    Date:  09/22/2020   ID:  Edi, Gorniak 10-08-1941, MRN [id number]  PCP:  Orpah Melter, MD  Cardiologist: Ena Dawley, MD EPS: [name], MD  No chief complaint on file.   History of Present Illness:  [name] is a 79 y.o. male with history of mixed cardiomyopathy and chronic CHF. Cardiac cath in 2016 staged PCI to the circumflex, LVEF 30 to 35% status post [facility] Jude BiV ICD 08/2015, LBBB, frequent PVCs, V. fib ICD in 2018 followed by Dr. Rayann Heman, PAF on Eliquis, obesity, hypertension, HLD, bilateral lymphedema.  Patient saw Sharrell Ku, PA-C 08/25/2020 with increasing dyspnea on exertion. Follow-up echo showed improved LVEF 40 to 45%, poor windows.  Lexiscan Myoview was read as high risk but reviewed by Dr. Meda Coffee who felt it was most consistent with prior scar and not convinced there was active ischemia or decreased blood flow.  She recommended medical titration.  Lasix was doubled and would consider changing losartan to Entresto.  Patient called in yesterday with his blood pressure trends ranging from 106-119  Past Medical History:  Diagnosis Date  . Acid reflux   . Biventricular ICD (implantable cardioverter-defibrillator) in place   . CAD (coronary artery disease)    a. PCI to Circ with DES on 04/06/15.  . Cardiomyopathy ([place])    a. mixed picture - suspected NICM but cannot exclude contribution from CAD.  Marland Kitchen Chronic systolic CHF (congestive heart failure) ([place])   . Degenerative disc disease, thoracic   . Essential hypertension   . Hyperlipidemia   . Hyperplastic colon polyp   . LBBB (left bundle branch block)   . 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 Mild dilation of ascending aorta (HCC)   . Nonischemic cardiomyopathy ([place])    a. s/p STJ CRTD 07/2015  . Obesity   . PAF (paroxysmal atrial fibrillation) ([place])   . Paroxysmal atrial fibrillation (HCC)     a. identified on device interrogation. All episodes <3 hours  . PVC's (premature ventricular contractions)   . Ventricular fibrillation ([place])    a. appropriate ICD therapy 02/2017    Past Surgical History:  Procedure Laterality Date  . CARDIAC CATHETERIZATION N/A 04/06/2015   Procedure: Right/Left Heart Cath and Coronary Angiography;  Surgeon: Larey Dresser, MD;  Location: [facility];  Service: Cardiovascular;  Laterality: N/A;  . CARDIAC CATHETERIZATION N/A 04/06/2015   Procedure: Coronary Stent Intervention;  Surgeon: Peter M Martinique, MD;  Location: [facility];  Service: Cardiovascular;  Laterality: N/A;  . CATARACT EXTRACTION W/ INTRAOCULAR LENS  IMPLANT, BILATERAL Bilateral 11/2014  . COLONOSCOPY  2009  . EP IMPLANTABLE DEVICE N/A 08/04/2015   STJ CRTD implanted by Dr Rayann Heman for primary prevention/CHF  . KNEE CARTILAGE SURGERY Left 1960  . SKIN CANCER EXCISION  05/2005; 07/2005   excision of merkel cell  . TONSILLECTOMY      Current Medications: No outpatient medications have been marked as taking for the 09/23/20 encounter (Appointment) with Imogene Burn, PA-C.     Allergies:   Tape   Social History   Socioeconomic History  . Marital status: Married    Spouse name: [name]  . Number of children: 2  . Years of education: college  . Highest education level: Not on file  Occupational History  . Occupation: Ow Loss adjuster, chartered  Tobacco Use  . Smoking status: Former Smoker  Packs/day: 0.25    Years: 15.00    Pack years: 3.75    Types: Cigarettes    Quit date: 10/25/1983    Years since quitting: 36.9  . Smokeless tobacco: Never Used  Vaping Use  . Vaping Use: Never used  Substance and Sexual Activity  . Alcohol use: Yes    Alcohol/week: 1.0 standard drink    Types: 1 Cans of beer per week    Comment: 08/04/2015 "I doubt if I average a glass of wine and 1 beer q week"  . Drug use: No  . Sexual activity: Not Currently  Other Topics Concern   . Not on file  Social History Narrative   Pt lives in [place], married x 45 years.  2 grown sons have HTN.   Works as a Higher education careers adviser business)   Social Determinants of Radio broadcast assistant Strain:   . Difficulty of Paying Living Expenses: Not on file  Food Insecurity:   . Worried About Charity fundraiser in the Last Year: Not on file  . Ran Out of Food in the Last Year: Not on file  Transportation Needs:   . Lack of Transportation (Medical): Not on file  . Lack of Transportation (Non-Medical): Not on file  Physical Activity:   . Days of Exercise per Week: Not on file  . Minutes of Exercise per Session: Not on file  Stress:   . Feeling of Stress : Not on file  Social Connections:   . Frequency of Communication with Friends and Family: Not on file  . Frequency of Social Gatherings with Friends and Family: Not on file  . Attends Religious Services: Not on file  . Active Member of Clubs or Organizations: Not on file  . Attends Archivist Meetings: Not on file  . Marital Status: Not on file     Family History:  The patient's ***family history includes Cancer in his father and mother; Lupus in his mother; Stroke in his sister; Thyroid disease in his sister.   ROS:   Please see the history of present illness.    ROS All other systems reviewed and are negative.   PHYSICAL EXAM:   VS:  There were no vitals taken for this visit.  Physical Exam  GEN: Well nourished, well developed, in no acute distress  HEENT: normal  Neck: no JVD, carotid bruits, or masses Cardiac:RRR; no murmurs, rubs, or gallops  Respiratory:  clear to auscultation bilaterally, normal work of breathing GI: soft, nontender, nondistended, + BS Ext: without cyanosis, clubbing, or edema, Good distal pulses bilaterally MS: no deformity or atrophy  Skin: warm and dry, no rash Neuro:  Alert and Oriented x 3, Strength and sensation are intact Psych: euthymic mood, full affect  Wt  Readings from Last 3 Encounters:  09/15/20 271 lb (122.9 kg)  08/25/20 271 lb (122.9 kg)  07/21/20 275 lb 9.6 oz (125 kg)      Studies/Labs Reviewed:   EKG:  EKG is*** ordered today.  The ekg ordered today demonstrates ***  Recent Labs: 06/16/2020: TSH 2.327 08/25/2020: ALT 19; BUN 17; Creatinine, Ser 1.14; Hemoglobin 13.7; NT-Pro BNP 934; Platelets 133; Potassium 4.3; Sodium 139   Lipid Panel    Component Value Date/Time   CHOL 107 08/25/2020 1303   TRIG 59 08/25/2020 1303   HDL 48 08/25/2020 1303   CHOLHDL 2.2 08/25/2020 1303   CHOLHDL 1.9 05/04/2016 1001   VLDL 10 05/04/2016 1001   LDLCALC 46  08/25/2020 1303    Additional studies/ records that were reviewed today include:  Lexiscan 09/15/2020  study Highlights     Nuclear stress EF: 29%.  There was no ST segment deviation noted during stress.  The left ventricular ejection fraction is severely decreased (<30%).  Defect 1: There is a large defect of severe severity present in the basal inferoseptal, basal inferior, basal inferolateral, mid inferolateral and apical inferior location.  Findings consistent with prior myocardial infarction with peri-infarct ischemia.  This is a high risk study.   Abnormal study, high risk due to EF and area size of abnormality. Large, severe defect seen at basal inferior/inferolateral/inferoseptal segments as well as mid inferolateral and apical inferior. This is largely fixed with a small amount of reversibility. There is also wall motion abnormality in this area. This is concerning for infarct with peri-infarct ischemia.   2D echo 09/15/2020  IMPRESSIONS     1. Left ventricular ejection fraction, by estimation, is 40 to 45%, very  difficult to assess due to image quality, abnormal septal motion and  frequent ectopy. The left ventricle has mildly decreased function. Left  ventricular endocardial border not  optimally defined to evaluate regional wall motion. There is mild left   ventricular hypertrophy. Left ventricular diastolic parameters are  indeterminate.   2. Right ventricular systolic function is normal. The right ventricular  size is normal. Tricuspid regurgitation signal is inadequate for assessing  PA pressure.   3. Left atrial size was moderately dilated.   4. The mitral valve is grossly normal. Trivial mitral valve  regurgitation. No evidence of mitral stenosis.   5. The aortic valve was not well visualized. Aortic valve regurgitation  is not visualized. No aortic stenosis is present.   6. Aortic dilatation noted. There is mild dilatation of the ascending  aorta, measuring 40 mm.   7. The inferior vena cava is normal in size with greater than 50%  respiratory variability, suggesting right atrial pressure of 3 mmHg.   FINDINGS   Left Ventricle: Left ventricular ejection fraction, by estimation, is 40  to 45%. The left ventricle has mildly decreased function. Left ventricular  endocardial border not optimally defined to evaluate regional wall motion.  Definity contrast agent was  given IV to delineate the left ventricular endocardial borders. The left  ventricular internal cavity size was not well visualized. There is mild  left ventricular hypertrophy. Left ventricular diastolic parameters are  indeterminate.   Right Ventricle: The right ventricular size is normal. Right vetricular  wall thickness was not well visualized. Right ventricular systolic  function is normal. Tricuspid regurgitation signal is inadequate for  assessing PA pressure.   Left Atrium: Left atrial size was moderately dilated.   Right Atrium: Right atrial size was normal in size.   Pericardium: There is no evidence of pericardial effusion.   Mitral Valve: The mitral valve is grossly normal. Trivial mitral valve  regurgitation. No evidence of mitral valve stenosis.   Tricuspid Valve: The tricuspid valve is grossly normal. Tricuspid valve  regurgitation is trivial.    Aortic Valve: The aortic valve was not well visualized. Aortic valve  regurgitation is not visualized. No aortic stenosis is present.   Pulmonic Valve: The pulmonic valve was grossly normal. Pulmonic valve  regurgitation is trivial.   Aorta: The ascending aorta was not well visualized and aortic dilatation  noted. There is mild dilatation of the ascending aorta, measuring 40 mm.   Venous: The inferior vena cava was not well visualized. The  inferior vena  cava is normal in size with greater than 50% respiratory variability,  suggesting right atrial pressure of 3 mmHg.   IAS/Shunts: The interatrial septum was not well visualized.        ASSESSMENT:    No diagnosis found.   PLAN:  In order of problems listed above:   Mixed cardiomyopathy ejection fraction improved to 40 to 45% on echo 09/15/2020  CAD status post staged PCI to the circumflex 2016 [place] 09/15/2020 considered high risk EF 29% mostly scar per Dr. Meda Coffee  Chronic combined CHF  [facility] Jude BiV ICD 08/2015 followed by Dr. Rayann Heman  Frequent PVCs  PAF on Eliquis  Essential hypertension  Hyperlipidemia  Obesity   Medication Adjustments/Labs and Tests Ordered: Current medicines are reviewed at length with the patient today.  Concerns regarding medicines are outlined above.  Medication changes, Labs and Tests ordered today are listed in the Patient Instructions below. There are no Patient Instructions on file for this visit.   Sumner Boast, PA-C  09/22/2020 8:18 AM    [facility] [address], [place], [place]  [id number] Phone: [phone]; Fax: [phone]

## 2020-09-23 ENCOUNTER — Ambulatory Visit: Payer: Medicare HMO | Admitting: Physician Assistant

## 2020-09-23 DIAGNOSIS — R0981 Nasal congestion: Secondary | ICD-10-CM | POA: Diagnosis not present

## 2020-09-23 DIAGNOSIS — R059 Cough, unspecified: Secondary | ICD-10-CM | POA: Diagnosis not present

## 2020-09-23 DIAGNOSIS — Z20822 Contact with and (suspected) exposure to covid-19: Secondary | ICD-10-CM | POA: Diagnosis not present

## 2020-09-28 DIAGNOSIS — H26493 Other secondary cataract, bilateral: Secondary | ICD-10-CM | POA: Diagnosis not present

## 2020-09-28 DIAGNOSIS — H40013 Open angle with borderline findings, low risk, bilateral: Secondary | ICD-10-CM | POA: Diagnosis not present

## 2020-09-28 DIAGNOSIS — H43813 Vitreous degeneration, bilateral: Secondary | ICD-10-CM | POA: Diagnosis not present

## 2020-09-28 DIAGNOSIS — Z961 Presence of intraocular lens: Secondary | ICD-10-CM | POA: Diagnosis not present

## 2020-09-30 NOTE — Progress Notes (Signed)
Cardiology Office Note   Date:  10/01/2020   ID:  Morrell, Fluke 1940-12-21, MRN [id number]  PCP:  Orpah Melter, MD  Cardiologist:  Dr. Meda Coffee, MD   Chief Complaint  Patient presents with  . Follow-up   History of Present Illness: [name] is a 79 y.o. male who presents for follow up, seen for Dr. Meda Coffee.   [name] has a history of cardiomyopathy/chronic systolic CHF s/p [employer] Jude BiV ICD implantation 08/2015, CAD s/p DES to LCx in 2016, LBBB, PVCs, VF (ICD therapy in 2018), PAF, obesity, HTN, HLD, Merkel cell cancer with chemo/radiation therapy, and GERD.  He was initially seen by Dr. Meda Coffee in 2016 for the evaluation of shortness of breath. Stress test showed no ischemia however echocardiogram showed a reduced LV function at 30 to 35%.  He underwent LHC with staged PCI to the LCx 03/2015. Cardiac MRI performed "showed diffuse hypokinesis, mild MR and mid inferior lateral wall and partially the basal and mid inferior wall associated with a 75 to 100% late gadolinium enhancement consistent with prior MI and no change of recovery if revascularized."  Despite GDMT, he had no significant improvement in LV function therefore BiV ICD placement was pursued.  He was previously seen by Dr. Rayann Heman for history of VF/PVCs with recommendations to consider sotalol versus ablation if recurrence.  He also had atrial fibrillation found on device interrogation and has been followed with the atrial fibrillation clinic. He was placed on Eliquis for anticoagulation.   He was most recently seen by Melina Copa, PA-C for the evaluation of DOE on 08/25/2020.  Given his current symptoms along with his significant hx, he was scheduled for an echocardiogram along with a Lexiscan nuclear stress test.  Both studies performed 09/15/2020.  Echo showed continued LV dysfunction with an LVEF at 40 to 45% (slightly improved from prior study at 35 to 40%) with abnormal septal motion, frequent ectopy,  diastolic dysfunction, moderately dilated LA, and mild aortic dilatation at 40 mm. Unfortunately, stress test showed a large defect of severe severity present in the basal inferior septal, basal inferior, basal inferior lateral, mid inferolateral and apical inferior locations consistent with prior MI with peri-infarct ischemia considered a high risk study. Study was considered high risk due to EF as well as an area size of abnormality with a small amount of reversibility, concerning for infarct.  Case discussed between Dr. Meda Coffee and Melina Copa, PA-C who felt that imaging was most consistent with prior scar and was not convinced there was active new ischemia/decreased blood flow.  She recommended working on medication titration and to have him be more physically active to build his exercise tolerance. His Lasix had been doubled for three days>>>with some mild improvement per our discussion today.   He reports that since he was last seen, his symptoms may have slightly improved however continues to have DOE. He states that his symptoms have been very gradual over the last 1.5 years. He reports [facility] [place] and feels that there has been a gradual decline since that time. He has no overt edema on exam today and states that he does not have angina. Very difficult to determine the etiology of his symptoms. As above we discussed the current plan is to proceed with medication titration and to attempt to increase is exercise capacity. He states that [facility] and feels  that he could actually tolerate this. We will have him follow closely and if no change, may still need to proceed with LHC. He agrees with the plan.   Past Medical History:  Diagnosis Date  . Acid reflux   . Biventricular ICD (implantable cardioverter-defibrillator) in place   . CAD (coronary artery disease)    a. PCI to Circ with DES on 04/06/15.  . Cardiomyopathy ([place])     a. mixed picture - suspected NICM but cannot exclude contribution from CAD.  Marland Kitchen Chronic systolic CHF (congestive heart failure) ([place])   . Degenerative disc disease, thoracic   . Essential hypertension   . Hyperlipidemia   . Hyperplastic colon polyp   . LBBB (left bundle branch block)   . Merkel cell cancer ([place]) 05/2005; 07-2005   s/p gluteal resection, lymph node dissection, chemo/ radiation  . Mild dilation of ascending aorta ([facility])   . Nonischemic cardiomyopathy ([place])    a. s/p STJ CRTD 07/2015  . Obesity   . PAF (paroxysmal atrial fibrillation) ([place])   . Paroxysmal atrial fibrillation ([facility])    a. identified on device interrogation. All episodes <3 hours  . PVC's (premature ventricular contractions)   . Ventricular fibrillation ([place])    a. appropriate ICD therapy 02/2017    Past Surgical History:  Procedure Laterality Date  . CARDIAC CATHETERIZATION N/A 04/06/2015   Procedure: Right/Left Heart Cath and Coronary Angiography;  Surgeon: Larey Dresser, MD;  Location: [facility];  Service: Cardiovascular;  Laterality: N/A;  . CARDIAC CATHETERIZATION N/A 04/06/2015   Procedure: Coronary Stent Intervention;  Surgeon: Peter M Martinique, MD;  Location: [facility];  Service: Cardiovascular;  Laterality: N/A;  . CATARACT EXTRACTION W/ INTRAOCULAR LENS  IMPLANT, BILATERAL Bilateral 11/2014  . COLONOSCOPY  2009  . EP IMPLANTABLE DEVICE N/A 08/04/2015   STJ CRTD implanted by Dr Rayann Heman for primary prevention/CHF  . KNEE CARTILAGE SURGERY Left 1960  . SKIN CANCER EXCISION  05/2005; 07/2005   excision of merkel cell  . TONSILLECTOMY       Current Outpatient Medications  Medication Sig Dispense Refill  . acetaminophen (TYLENOL) 325 MG tablet Take 650 mg by mouth every 6 (six) hours as needed for mild pain or moderate pain.    Marland Kitchen apixaban (ELIQUIS) 5 MG TABS tablet Take 1 tablet (5 mg total) by mouth 2 (two) times daily. 60 tablet 3  . atorvastatin (LIPITOR) 80 MG tablet Take 1  tablet (80 mg total) by mouth daily. Please keep upcoming appt in October with Dr. Meda Coffee for future refills. Thank you 90 tablet 0  . fluorouracil (EFUDEX) 5 % cream Apply 5 application topically daily as needed (Patches on face).   0  . furosemide (LASIX) 20 MG tablet Take 2 tablets by mouth twice a day X's 3 days then go back to taking 1 tablet by mouth twice a day 90 tablet 3  . levothyroxine (SYNTHROID, LEVOTHROID) 50 MCG tablet Take 50 mcg by mouth daily.  5  . POTASSIUM PO Take 550 mg by mouth daily as needed (Potassium supplement).     . SODIUM FLUORIDE 5000 PPM 1.1 % PSTE Take by mouth.    . spironolactone (ALDACTONE) 25 MG tablet Take 0.5 tablets (12.5 mg total) by mouth daily. 45 tablet 3  . carvedilol (COREG) 6.25 MG tablet Take 1 tablet (6.25 mg total) by mouth 2 (two) times daily. 180 tablet 3  . sacubitril-valsartan (ENTRESTO) 24-26 MG Take 1 tablet by mouth 2 (two)  times daily. 60 tablet 11   No current facility-administered medications for this visit.   Facility-Administered Medications Ordered in Other Visits  Medication Dose Route Frequency Provider Last Rate Last Admin  . adenosine (diagnostic) (ADENOSCAN) infusion 68.4 mg  0.56 mg/kg Intravenous Once Crenshaw, Denice Bors, MD        Allergies:   Tape    Social History:  The patient  reports that he quit smoking about 36 years ago. His smoking use included cigarettes. He has a 3.75 pack-year smoking history. He has never used smokeless tobacco. He reports current alcohol use of about 1.0 standard drink of alcohol per week. He reports that he does not use drugs.   Family History:  The patient's family history includes Cancer in his father and mother; Lupus in his mother; Stroke in his sister; Thyroid disease in his sister.    ROS:  Please see the history of present illness.   Otherwise, review of systems are positive for none.   All other systems are reviewed and negative.    PHYSICAL EXAM: VS:  BP 110/82   Pulse 69   Ht  5\' 11"  (1.803 m)   Wt 276 lb (125.2 kg)   SpO2 98%   BMI 38.49 kg/m  , BMI Body mass index is 38.49 kg/m.   General: Well developed, well nourished, NAD Neck: Negative for carotid bruits. No JVD Lungs:Clear to ausculation bilaterally. No wheezes, rales, or rhonchi. Breathing is unlabored. Cardiovascular: RRR with S1 S2. No murmurs Neuro: Alert and oriented. No focal deficits. No facial asymmetry. MAE spontaneously. Psych: Responds to questions appropriately with normal affect.    EKG:  EKG is not ordered today.  Recent Labs: 06/16/2020: TSH 2.327 08/25/2020: ALT 19; BUN 17; Creatinine, Ser 1.14; Hemoglobin 13.7; NT-Pro BNP 934; Platelets 133; Potassium 4.3; Sodium 139   Lipid Panel    Component Value Date/Time   CHOL 107 08/25/2020 1303   TRIG 59 08/25/2020 1303   HDL 48 08/25/2020 1303   CHOLHDL 2.2 08/25/2020 1303   CHOLHDL 1.9 05/04/2016 1001   VLDL 10 05/04/2016 1001   LDLCALC 46 08/25/2020 1303      Wt Readings from Last 3 Encounters:  10/01/20 276 lb (125.2 kg)  09/15/20 271 lb (122.9 kg)  08/25/20 271 lb (122.9 kg)      Other studies Reviewed: Additional studies/ records that were reviewed today include: . Review of the above records demonstrates:     Nuclear stress EF: 29%.  There was no ST segment deviation noted during stress.  The left ventricular ejection fraction is severely decreased (<30%).  Defect 1: There is a large defect of severe severity present in the basal inferoseptal, basal inferior, basal inferolateral, mid inferolateral and apical inferior location.  Findings consistent with prior myocardial infarction with peri-infarct ischemia.  This is a high risk study.   Abnormal study, high risk due to EF and area size of abnormality. Large, severe defect seen at basal inferior/inferolateral/inferoseptal segments as well as mid inferolateral and apical inferior. This is largely fixed with a small amount of reversibility. There is also wall  motion abnormality in this area. This is concerning for infarct with peri-infarct ischemia.  Lexiscan stress test 09/15/2020:   Nuclear stress EF: 29%.  There was no ST segment deviation noted during stress.  The left ventricular ejection fraction is severely decreased (<30%).  Defect 1: There is a large defect of severe severity present in the basal inferoseptal, basal inferior, basal inferolateral, mid inferolateral and  apical inferior location.  Findings consistent with prior myocardial infarction with peri-infarct ischemia.  This is a high risk study.   Abnormal study, high risk due to EF and area size of abnormality. Large, severe defect seen at basal inferior/inferolateral/inferoseptal segments as well as mid inferolateral and apical inferior. This is largely fixed with a small amount of reversibility. There is also wall motion abnormality in this area. This is concerning for infarct with peri-infarct ischemia.  ASSESSMENT AND PLAN:  1.  Known CAD with new DOE: -Seen DOE at which time patient underwent echocardiogram as well as Lexiscan nuclear stress test 09/15/2020.   -Echocardiogram showed persistent LV dysfunction relatively unchanged from prior study however nuclear stress test showed areas of possible new ischemia. Case discussed with Dr. Meda Coffee who recommended medication titration and increasing exercise tolerance.  -Lasix was increased for several days with resumption of prior dosing thereafter with mild improvement -Plan to repeat BNP level today and obtain BMET  -For now we will continue his current Lasix dosing until labs result -To allow for BP, will reduce carvedilol to 6.25mg  BID and stop losartan>>add Entresto 24-26 doing with close follow up  2.  CAD: -Recent stress test 09/15/20 with a large defect of severe severity present in the basal inferior septal, basal inferior, basal inferior lateral, mid inferolateral and apical inferior locations consistent with prior  MI with peri-infarct ischemia considered a high risk study.  Study was considered high risk due to EF as well as an area size of abnormality with a small amount of reversibility concerning for infarct. -Case discussed between Dr. Meda Coffee and Melina Copa, PA-C who felt that imaging was most consistent with prior scar and was not convinced there was active new ischemia/decreased blood flow.  -Plan to increase exercise tolerance>>>[facility] -Mild improvement in [place] with increase in [place] -Obtain BNP, BMET -If symptoms persist at f/u>>my consider ischemic workup. There was no other residual disease noted on cath report from 2016.   3.  HLD: -Last LDL, 35 on 08/10/2020 -Continue current regimen   4.  Chronic combined systolic and diastolic CHF: -EF on echo now at 40-45% with abnormal septal motion, LVH, DD  5.  Mild mitral regurgitation: -Actually noted to be trivial on most recent echo 09/15/20 -Continue to monitor for overt symptoms   Current medicines are reviewed at length with the patient today.  The patient does not have concerns regarding medicines.  The following changes have been made:  Stop losartan, start entresto 24-26, decrease carvedilol  Labs/ tests ordered today include: BNP, BMET  Orders Placed This Encounter  Procedures  . Pro b natriuretic peptide (BNP)  . Basic metabolic panel   Disposition:   FU with myself or Dayna Dunn in 2 weeks  Signed, Kathyrn Drown, NP  10/01/2020 2:32 PM    [facility] [address], [place], [place]  [address] Phone: [phone]; Fax: [phone]

## 2020-10-01 ENCOUNTER — Ambulatory Visit: Payer: Medicare HMO | Admitting: Cardiology

## 2020-10-01 ENCOUNTER — Other Ambulatory Visit: Payer: Self-pay

## 2020-10-01 ENCOUNTER — Encounter: Payer: Self-pay | Admitting: Cardiology

## 2020-10-01 ENCOUNTER — Ambulatory Visit: Payer: Medicare HMO | Admitting: Physician Assistant

## 2020-10-01 VITALS — BP 110/82 | HR 69 | Ht 71.0 in | Wt 276.0 lb

## 2020-10-01 DIAGNOSIS — I519 Heart disease, unspecified: Secondary | ICD-10-CM | POA: Diagnosis not present

## 2020-10-01 DIAGNOSIS — I480 Paroxysmal atrial fibrillation: Secondary | ICD-10-CM

## 2020-10-01 DIAGNOSIS — I2510 Atherosclerotic heart disease of native coronary artery without angina pectoris: Secondary | ICD-10-CM | POA: Diagnosis not present

## 2020-10-01 DIAGNOSIS — I5022 Chronic systolic (congestive) heart failure: Secondary | ICD-10-CM

## 2020-10-01 DIAGNOSIS — I428 Other cardiomyopathies: Secondary | ICD-10-CM | POA: Diagnosis not present

## 2020-10-01 DIAGNOSIS — I340 Nonrheumatic mitral (valve) insufficiency: Secondary | ICD-10-CM | POA: Diagnosis not present

## 2020-10-01 DIAGNOSIS — E785 Hyperlipidemia, unspecified: Secondary | ICD-10-CM | POA: Diagnosis not present

## 2020-10-01 DIAGNOSIS — R0609 Other forms of dyspnea: Secondary | ICD-10-CM

## 2020-10-01 DIAGNOSIS — R0600 Dyspnea, unspecified: Secondary | ICD-10-CM | POA: Diagnosis not present

## 2020-10-01 DIAGNOSIS — I255 Ischemic cardiomyopathy: Secondary | ICD-10-CM | POA: Diagnosis not present

## 2020-10-01 MED ORDER — ENTRESTO 24-26 MG PO TABS: 1.0000 | ORAL_TABLET | Freq: Two times a day (BID) | ORAL | 11 refills | Status: DC

## 2020-10-01 MED ORDER — CARVEDILOL 6.25 MG PO TABS: 6.2500 mg | ORAL_TABLET | Freq: Two times a day (BID) | ORAL | 3 refills | Status: DC

## 2020-10-01 NOTE — Patient Instructions (Addendum)
Medication Instructions:  Your physician has recommended you make the following change in your medication:  1. STOP LOSARTAN   2. START ENTRESTO 24-26 TWICE DAILY  3. DECREASE CARVEDILOL TO 6.25 MG TWICE DAILY.  *If you need a refill on your cardiac medications before your next appointment, please call your pharmacy*   Lab Work: TODAY: BMET If you have labs (blood work) drawn today and your tests are completely normal, you will receive your results only by: Marland Kitchen MyChart Message (if you have MyChart) OR . A paper copy in the mail If you have any lab test that is abnormal or we need to change your treatment, we will call you to review the results.   Testing/Procedures: NONE   Follow-Up: At [facility], you and your health needs are our priority.  As part of our continuing mission to provide you with exceptional heart care, we have created designated Provider Care Teams.  These Care Teams include your primary Cardiologist (physician) and Advanced Practice Providers (APPs -  Physician Assistants and Nurse Practitioners) who all work together to provide you with the care you need, when you need it.  We recommend signing up for the patient portal called "MyChart".  Sign up information is provided on this After Visit Summary.  MyChart is used to connect with patients for Virtual Visits (Telemedicine).  Patients are able to view lab/test results, encounter notes, upcoming appointments, etc.  Non-urgent messages can be sent to your provider as well.   To learn more about what you can do with MyChart, go to [email or web address].    Your next appointment:   10/07/20 AT 10:45 AM  The format for your next appointment:   Virtual Visit   Provider:     Kathyrn Drown, NP    Other Instructions

## 2020-10-02 LAB — BASIC METABOLIC PANEL
BUN/Creatinine Ratio: 16 (ref 10–24)
BUN: 17 mg/dL (ref 8–27)
CO2: 23 mmol/L (ref 20–29)
Calcium: 9.3 mg/dL (ref 8.6–10.2)
Chloride: 102 mmol/L (ref 96–106)
Creatinine, Ser: 1.06 mg/dL (ref 0.76–1.27)
GFR calc Af Amer: 77 mL/min/{1.73_m2} (ref >59)
GFR calc non Af Amer: 66 mL/min/{1.73_m2} (ref >59)
Glucose: 100 mg/dL — ABNORMAL HIGH (ref 65–99)
Potassium: 4.1 mmol/L (ref 3.5–5.2)
Sodium: 139 mmol/L (ref 134–144)

## 2020-10-02 LAB — PRO B NATRIURETIC PEPTIDE: NT-Pro BNP: 855 pg/mL — ABNORMAL HIGH (ref 0–486)

## 2020-10-03 ENCOUNTER — Other Ambulatory Visit (HOSPITAL_COMMUNITY): Payer: Self-pay | Admitting: Physician Assistant

## 2020-10-05 NOTE — Telephone Encounter (Signed)
Prescription refill request for Eliquis received. Indication: Atrial Fibrillation Last office visit: 09/2020 Scr: 1.06 09/2020 Age: 79 Weight:125.2 kg  Prescription refilled

## 2020-10-05 NOTE — Progress Notes (Signed)
Virtual Visit via Telephone Note   This visit type was conducted due to national recommendations for restrictions regarding the COVID-19 Pandemic (e.g. social distancing) in an effort to limit this patient's exposure and mitigate transmission in our community.  Due to his co-morbid illnesses, this patient is at least at moderate risk for complications without adequate follow up.  This format is felt to be most appropriate for this patient at this time.  The patient did not have access to video technology/had technical difficulties with video requiring transitioning to audio format only (telephone).  All issues noted in this document were discussed and addressed.  No physical exam could be performed with this format.  Please refer to the patient's chart for his  consent to telehealth for [facility].    Date:  10/05/2020   ID:  [name], DOB 03-06-1941, MRN [id number] The patient was identified using 2 identifiers.  Patient Location: Home Provider Location: Office  PCP:  Orpah Melter, MD  Cardiologist:  Ena Dawley, MD  Electrophysiologist:  [name], MD   Evaluation Performed:  Follow-Up Visit  Chief Complaint:  Follow up   History of Present Illness:    GERRY BLANCHFIELD is a 79 y.o. male with has a history of cardiomyopathy/chronic systolic CHF s/p [employer]. Jude BiV ICD implantation 08/2015, CAD s/p DES to LCx in 2016, LBBB, PVCs, VF (ICD therapy in 2018), PAF, obesity, HTN, HLD, Merkel cell cancer with chemo/radiation therapy, and GERD.  He was initially seen by Dr. Meda Coffee in 2016 for the evaluation of shortness of breath. Stress test showed no ischemia however echocardiogram showed a reduced LV function at 30 to 35%.  He underwent LHC with staged PCI to the LCx 03/2015. Cardiac MRI performed "showed diffuse hypokinesis, mild MR and mid inferior lateral wall and partially the basal and mid inferior wall associated with a 75 to 100% late gadolinium enhancement consistent  with prior MI and no change of recovery if revascularized."  Despite GDMT, he had no significant improvement in LV function therefore BiV ICD placement was pursued.  He was previously seen by Dr. Rayann Heman for history of VF/PVCs with recommendations to consider sotalol versus ablation if recurrence.  He also had atrial fibrillation found on device interrogation and has been followed with the atrial fibrillation clinic. He was placed on Eliquis for anticoagulation.   He was most recently seen by Melina Copa, PA-C for the evaluation of DOE on 08/25/2020.  Given his current symptoms along with his significant hx, he was scheduled for an echocardiogram along with a Lexiscan nuclear stress test.  Both studies performed 09/15/2020.  Echo showed continued LV dysfunction with an LVEF at 40 to 45% (slightly improved from prior study at 35 to 40%) with abnormal septal motion, frequent ectopy, diastolic dysfunction, moderately dilated LA, and mild aortic dilatation at 40 mm. Unfortunately, stress test showed a large defect of severe severity present in the basal inferior septal, basal inferior, basal inferior lateral, mid inferolateral and apical inferior locations consistent with prior MI with peri-infarct ischemia considered a high risk study. Study was considered high risk due to EF as well as an area size of abnormality with a small amount of reversibility, concerning for infarct.  Case discussed between Dr. Meda Coffee and Melina Copa, PA-C who felt that imaging was most consistent with prior scar and was not convinced there was active new ischemia/decreased blood flow.  She recommended working on medication titration and to have him be more physically active to  build his exercise tolerance. His Lasix had been doubled for three days>>>with some mild improvement per our discussion today.   He was then by myself in follow-up and reported his symptoms had slightly improved however with not significant change.  Plan at that  time was to add [facility] 24/26 dosing along with lab work including BNP and BMET with close follow-up.  Unfortunately, today he reports he has been to his pharmacy twice and has been unable to fill his Delene Loll as they have been out of this medication.  He reports he will go by there again today for follow-up.  BP is on the lower side of normal today however reports a repeat while on the phone and SBP in the 120 range.  He states his symptoms have stayed relatively the same.  On repeat BNP, there was mild improvement.  He had increased his Lasix to 40 mg twice daily x3 days.  Creatinine is stable.  Given this, we discussed increasing his Lasix to 40 mg twice daily until next follow-up in the next 2 weeks.  We will also repeat lab work at the [facility] office prior to virtual visit.  Discussed watching his BP closely with the addition of Entresto as this can drop his BP.  He is to call if he has symptoms of orthostatic hypotension.  He denies chest pain, LE edema, dizziness or syncope.   The patient does not have symptoms concerning for COVID-19 infection (fever, chills, cough, or new shortness of breath).    Past Medical History:  Diagnosis Date  . Acid reflux   . Biventricular ICD (implantable cardioverter-defibrillator) in place   . CAD (coronary artery disease)    a. PCI to Circ with DES on 04/06/15.  . Cardiomyopathy ([place])    a. mixed picture - suspected NICM but cannot exclude contribution from CAD.  Marland Kitchen Chronic systolic CHF (congestive heart failure) ([place])   . Degenerative disc disease, thoracic   . Essential hypertension   . Hyperlipidemia   . Hyperplastic colon polyp   . LBBB (left bundle branch block)   . 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 Mild dilation of ascending aorta (HCC)   . Nonischemic cardiomyopathy ([place])    a. s/p STJ CRTD 07/2015  . Obesity   . PAF (paroxysmal atrial fibrillation) ([place])   .  Paroxysmal atrial fibrillation (HCC)    a. identified on device interrogation. All episodes <3 hours  . PVC's (premature ventricular contractions)   . Ventricular fibrillation ([place])    a. appropriate ICD therapy 02/2017   Past Surgical History:  Procedure Laterality Date  . CARDIAC CATHETERIZATION N/A 04/06/2015   Procedure: Right/Left Heart Cath and Coronary Angiography;  Surgeon: Larey Dresser, MD;  Location: [place] [facility];  Service: Cardiovascular;  Laterality: N/A;  . CARDIAC CATHETERIZATION N/A 04/06/2015   Procedure: Coronary Stent Intervention;  Surgeon: Peter M Martinique, MD;  Location: [place] [facility];  Service: Cardiovascular;  Laterality: N/A;  . CATARACT EXTRACTION W/ INTRAOCULAR LENS  IMPLANT, BILATERAL Bilateral 11/2014  . COLONOSCOPY  2009  . EP IMPLANTABLE DEVICE N/A 08/04/2015   STJ CRTD implanted by Dr Rayann Heman for primary prevention/CHF  . KNEE CARTILAGE SURGERY Left 1960  . SKIN CANCER EXCISION  05/2005; 07/2005   excision of merkel cell  . TONSILLECTOMY       No outpatient medications have been marked as taking for the 10/07/20 encounter (Appointment) with Glenford Peers,  Alphonsa Overall, NP.     Allergies:   Tape   Social History   Tobacco Use  . Smoking status: Former Smoker    Packs/day: 0.25    Years: 15.00    Pack years: 3.75    Types: Cigarettes    Quit date: 10/25/1983    Years since quitting: 36.9  . Smokeless tobacco: Never Used  Vaping Use  . Vaping Use: Never used  Substance Use Topics  . Alcohol use: Yes    Alcohol/week: 1.0 standard drink    Types: 1 Cans of beer per week    Comment: 08/04/2015 "I doubt if I average a glass of wine and 1 beer q week"  . Drug use: No     Family Hx: The patient's family history includes Cancer in his father and mother; Lupus in his mother; Stroke in his sister; Thyroid disease in his sister. There is no history of Heart attack or Hypertension.  ROS:   Please see the history of present illness.     All other systems  reviewed and are negative.  Prior CV studies:   The following studies were reviewed today:    Nuclear stress EF: 29%.  There was no ST segment deviation noted during stress.  The left ventricular ejection fraction is severely decreased (<30%).  Defect 1: There is a large defect of severe severity present in the basal inferoseptal, basal inferior, basal inferolateral, mid inferolateral and apical inferior location.  Findings consistent with prior myocardial infarction with peri-infarct ischemia.  This is a high risk study.  Abnormal study, high risk due to EF and area size of abnormality. Large, severe defect seen at basal inferior/inferolateral/inferoseptal segments as well as mid inferolateral and apical inferior. This is largely fixed with a small amount of reversibility. There is also wall motion abnormality in this area. This is concerning for infarct with peri-infarct ischemia.  Lexiscan stress test 09/15/2020:   Nuclear stress EF: 29%.  There was no ST segment deviation noted during stress.  The left ventricular ejection fraction is severely decreased (<30%).  Defect 1: There is a large defect of severe severity present in the basal inferoseptal, basal inferior, basal inferolateral, mid inferolateral and apical inferior location.  Findings consistent with prior myocardial infarction with peri-infarct ischemia.  This is a high risk study.  Abnormal study, high risk due to EF and area size of abnormality. Large, severe defect seen at basal inferior/inferolateral/inferoseptal segments as well as mid inferolateral and apical inferior. This is largely fixed with a small amount of reversibility. There is also wall motion abnormality in this area. This is concerning for infarct with peri-infarct ischemia.   Labs/Other Tests and Data Reviewed:    EKG:  No ECG reviewed.  Recent Labs: 06/16/2020: TSH 2.327 08/25/2020: ALT 19; Hemoglobin 13.7; Platelets 133 10/01/2020: BUN  17; Creatinine, Ser 1.06; NT-Pro BNP 855; Potassium 4.1; Sodium 139   Recent Lipid Panel Lab Results  Component Value Date/Time   CHOL 107 08/25/2020 01:03 PM   TRIG 59 08/25/2020 01:03 PM   HDL 48 08/25/2020 01:03 PM   CHOLHDL 2.2 08/25/2020 01:03 PM   CHOLHDL 1.9 05/04/2016 10:01 AM   LDLCALC 46 08/25/2020 01:03 PM    Wt Readings from Last 3 Encounters:  10/01/20 276 lb (125.2 kg)  09/15/20 271 lb (122.9 kg)  08/25/20 271 lb (122.9 kg)     Objective:    Vital Signs:  There were no vitals taken for this visit.   VITAL SIGNS:  reviewed GEN:  no acute distress NEURO:  alert and oriented x 3, no obvious focal deficit PSYCH:  normal affect  ASSESSMENT & PLAN:    1.  Known CAD with new DOE: -Seen DOE at which time patient underwent echocardiogram as well as Lexiscan nuclear stress test 09/15/2020.   -Echocardiogram showed persistent LV dysfunction relatively unchanged from prior study however nuclear stress test showed areas of possible new ischemia. Case discussed with Dr. Meda Coffee who recommended medication titration and increasing exercise tolerance.  -Lasix was increased for several days with resumption of prior dosing thereafter with mild improvement>>will increase to 40mg  BID given elevated but improved BNP and persistent symptoms -Was to start Entresto after last OV however reports his pharm did not fill>>he is going there again today and to call if he has any trouble filling this. Discussed watching for s/s of orthostatic hypotension symptoms such as dizziness or lightheadedness.  -BMET prior to next f/u  2.  CAD: -Recent stress test 09/15/20 with a large defect of severe severity present in the basal inferior septal, basal inferior, basal inferior lateral, mid inferolateral and apical inferior locations consistent with prior MI with peri-infarct ischemia considered a high risk study.  Study was considered high risk due to EF as well as an area size of abnormality with a  small amount of reversibility concerning for infarct. -Case discussed between Dr. Meda Coffee and Melina Copa, PA-C who felt that imaging was most consistent with prior scar and was not convinced there was active new ischemia/decreased blood flow.  -Plan to increase exercise tolerance>>>[facility] [place] -If symptoms persist at f/u>>my consider ischemic workup. There was no other residual disease noted on cath report from 2016.   3.  HLD: -Last LDL, 35 on 08/10/2020 -Continue current regimen   4.  Chronic combined systolic and diastolic CHF: -EF on echo now at 40-45% with abnormal septal motion, LVH, DD  5.  Mild mitral regurgitation: -Actually noted to be trivial on most recent echo 09/15/20 -Continue to monitor for overt symptoms    Shared Decision Making/Informed Consent     COVID-19 Education: The signs and symptoms of COVID-19 were discussed with the patient and how to seek care for testing (follow up with PCP or arrange E-visit).  The importance of social distancing was discussed today.  Time:   Today, I have spent 20 minutes with the patient with telehealth technology discussing the above problems.     Medication Adjustments/Labs and Tests Ordered: Current medicines are reviewed at length with the patient today.  Concerns regarding medicines are outlined above.   Tests Ordered: No orders of the defined types were placed in this encounter.   Medication Changes: No orders of the defined types were placed in this encounter.   Follow Up:  Virtual Visit  myself or Dayna Dunn in 2 weeks   Signed, Kathyrn Drown, NP  10/05/2020 6:21 PM    [place]

## 2020-10-07 ENCOUNTER — Encounter: Payer: Self-pay | Admitting: Cardiology

## 2020-10-07 ENCOUNTER — Telehealth (INDEPENDENT_AMBULATORY_CARE_PROVIDER_SITE_OTHER): Payer: Medicare HMO | Admitting: Cardiology

## 2020-10-07 ENCOUNTER — Other Ambulatory Visit: Payer: Self-pay

## 2020-10-07 VITALS — BP 110/64 | HR 80 | Ht 71.0 in | Wt 269.0 lb

## 2020-10-07 DIAGNOSIS — R0600 Dyspnea, unspecified: Secondary | ICD-10-CM

## 2020-10-07 DIAGNOSIS — I10 Essential (primary) hypertension: Secondary | ICD-10-CM | POA: Diagnosis not present

## 2020-10-07 DIAGNOSIS — I2510 Atherosclerotic heart disease of native coronary artery without angina pectoris: Secondary | ICD-10-CM

## 2020-10-07 DIAGNOSIS — R0609 Other forms of dyspnea: Secondary | ICD-10-CM

## 2020-10-07 DIAGNOSIS — I519 Heart disease, unspecified: Secondary | ICD-10-CM

## 2020-10-07 DIAGNOSIS — E7849 Other hyperlipidemia: Secondary | ICD-10-CM | POA: Diagnosis not present

## 2020-10-07 DIAGNOSIS — I255 Ischemic cardiomyopathy: Secondary | ICD-10-CM | POA: Diagnosis not present

## 2020-10-07 MED ORDER — FUROSEMIDE 40 MG PO TABS: 40.0000 mg | ORAL_TABLET | Freq: Two times a day (BID) | ORAL | 3 refills | Status: DC

## 2020-10-07 NOTE — Patient Instructions (Signed)
Medication Instructions:  Your physician has recommended you make the following change in your medication:  1. INCREASE LASIX TO 40 MG TWICE DAILY.  *If you need a refill on your cardiac medications before your next appointment, please call your pharmacy*   Lab Work: TO BE DONE BEFORE NEXT FOLLOW UP: BMET on 1/3 If you have labs (blood work) drawn today and your tests are completely normal, you will receive your results only by: Marland Kitchen MyChart Message (if you have MyChart) OR . A paper copy in the mail If you have any lab test that is abnormal or we need to change your treatment, we will call you to review the results.   Testing/Procedures: NONE   Follow-Up: At [facility], you and your health needs are our priority.  As part of our continuing mission to provide you with exceptional heart care, we have created designated Provider Care Teams.  These Care Teams include your primary Cardiologist (physician) and Advanced Practice Providers (APPs -  Physician Assistants and Nurse Practitioners) who all work together to provide you with the care you need, when you need it.  We recommend signing up for the patient portal called "MyChart".  Sign up information is provided on this After Visit Summary.  MyChart is used to connect with patients for Virtual Visits (Telemedicine).  Patients are able to view lab/test results, encounter notes, upcoming appointments, etc.  Non-urgent messages can be sent to your provider as well.   To learn more about what you can do with MyChart, go to [email or web address].    Your next appointment:   2 week(s) on 10-29-20 at 3:15  The format for your next appointment:   Virtual Visit   Provider:   Kathyrn Drown, NP or Melina Copa, PA-C

## 2020-10-15 ENCOUNTER — Other Ambulatory Visit: Payer: Self-pay

## 2020-10-15 ENCOUNTER — Ambulatory Visit: Payer: Medicare HMO | Admitting: Internal Medicine

## 2020-10-15 ENCOUNTER — Encounter: Payer: Self-pay | Admitting: Internal Medicine

## 2020-10-15 VITALS — BP 102/68 | HR 88 | Ht 71.0 in | Wt 276.0 lb

## 2020-10-15 DIAGNOSIS — I472 Ventricular tachycardia, unspecified: Secondary | ICD-10-CM

## 2020-10-15 DIAGNOSIS — I5022 Chronic systolic (congestive) heart failure: Secondary | ICD-10-CM | POA: Diagnosis not present

## 2020-10-15 DIAGNOSIS — I480 Paroxysmal atrial fibrillation: Secondary | ICD-10-CM

## 2020-10-15 DIAGNOSIS — Z95810 Presence of automatic (implantable) cardiac defibrillator: Secondary | ICD-10-CM

## 2020-10-15 DIAGNOSIS — I255 Ischemic cardiomyopathy: Secondary | ICD-10-CM

## 2020-10-15 DIAGNOSIS — I10 Essential (primary) hypertension: Secondary | ICD-10-CM | POA: Diagnosis not present

## 2020-10-15 NOTE — Progress Notes (Signed)
PCP: Orpah Melter, MD Primary Cardiologist: Dr Meda Coffee Primary EP: Dr Rayann Heman  Chevis [name] is a 79 y.o. male who presents today for routine electrophysiology followup.  Since last being seen in our clinic, the patient reports doing very well.  Today, he denies symptoms of palpitations, chest pain, shortness of breath,  lower extremity edema, dizziness, presyncope, syncope, or ICD shocks.  The patient is otherwise without complaint today.   Past Medical History:  Diagnosis Date   Acid reflux    Biventricular ICD (implantable cardioverter-defibrillator) in place    CAD (coronary artery disease)    a. PCI to Circ with DES on 04/06/15.   Cardiomyopathy ([place])    a. mixed picture - suspected NICM but cannot exclude contribution from CAD.   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 s/p STJ CRTD 07/2015   Obesity    PAF (paroxysmal atrial fibrillation) (HCC)    Paroxysmal atrial fibrillation ([place])    a. identified on device interrogation. All episodes <3 hours   PVC's (premature ventricular contractions)    Ventricular fibrillation ([place])    a. appropriate ICD therapy 02/2017   Past Surgical History:  Procedure Laterality Date   CARDIAC CATHETERIZATION N/A 04/06/2015   Procedure: Right/Left Heart Cath and Coronary Angiography;  Surgeon: Larey Dresser, MD;  Location: [place] CV LAB;  Service: Cardiovascular;  Laterality: N/A;   CARDIAC CATHETERIZATION N/A 04/06/2015   Procedure: Coronary Stent Intervention;  Surgeon: Peter M Martinique, MD;  Location: [place] [facility];  Service: Cardiovascular;  Laterality: N/A;    CATARACT EXTRACTION W/ INTRAOCULAR LENS  IMPLANT, BILATERAL Bilateral 11/2014   COLONOSCOPY  2009   EP IMPLANTABLE DEVICE N/A 08/04/2015   STJ CRTD implanted by Dr Rayann Heman for primary prevention/CHF   KNEE CARTILAGE SURGERY Left 1960   SKIN CANCER EXCISION  05/2005; 07/2005   excision of merkel cell   TONSILLECTOMY      ROS- all systems are reviewed and negative except as per HPI above  Current Outpatient Medications  Medication Sig Dispense Refill   acetaminophen (TYLENOL) 325 MG tablet Take 650 mg by mouth every 6 (six) hours as needed for mild pain or moderate pain.     atorvastatin (LIPITOR) 80 MG tablet Take 1 tablet (80 mg total) by mouth daily. Please keep upcoming appt in October with Dr. Meda Coffee for future refills. Thank you 90 tablet 0   carvedilol (COREG) 6.25 MG tablet Take 1 tablet (6.25 mg total) by mouth 2 (two) times daily. 180 tablet 3   ELIQUIS 5 MG TABS tablet TAKE 1 TABLET BY MOUTH TWICE A DAY 60 tablet 5   fluorouracil (EFUDEX) 5 % cream Apply 5 application topically daily as needed (Patches on face).   0   furosemide (LASIX) 40 MG tablet Take 1 tablet (40 mg total) by mouth 2 (two) times daily. 180 tablet 3   levothyroxine (SYNTHROID, LEVOTHROID) 50 MCG tablet Take 50 mcg by mouth daily.  5   POTASSIUM PO Take 550 mg by mouth daily as needed (Potassium supplement).      sacubitril-valsartan (ENTRESTO) 24-26 MG Take  1 tablet by mouth 2 (two) times daily. 60 tablet 11   SODIUM FLUORIDE 5000 PPM 1.1 % PSTE Take by mouth.     spironolactone (ALDACTONE) 25 MG tablet Take 0.5 tablets (12.5 mg total) by mouth daily. 45 tablet 3   No current facility-administered medications for this visit.   Facility-Administered Medications Ordered in Other Visits  Medication Dose Route Frequency Provider Last Rate Last Admin   adenosine (diagnostic) (ADENOSCAN) infusion 68.4 mg  0.56 mg/kg Intravenous Once Lelon Perla, MD        Physical Exam: Vitals:   10/15/20  1436  BP: 102/68  Pulse: 88  SpO2: 92%  Weight: 276 lb (125.2 kg)  Height: 5\' 11"  (1.803 m)    GEN- The patient is well appearing, alert and oriented x 3 today.   Head- normocephalic, atraumatic Eyes-  Sclera clear, conjunctiva pink Ears- hearing intact Oropharynx- clear Lungs- Clear to ausculation bilaterally, normal work of breathing Chest- ICD pocket is well healed Heart- Regular rate and rhythm, no murmurs, rubs or gallops, PMI not laterally displaced GI- soft, NT, ND, + BS Extremities- no clubbing, cyanosis, or edema  ICD interrogation- reviewed in detail today,  See PACEART report  ekg tracing ordered today is personally reviewed and shows sinus with BiV pacing  Wt Readings from Last 3 Encounters:  10/15/20 276 lb (125.2 kg)  10/07/20 269 lb (122 kg)  10/01/20 276 lb (125.2 kg)    Assessment and Plan:  1.  Prior VF arrest Normal ICD function  2. Chronic systolic dysfunction/ ischemic CM/ CAD euvolemic today No ischemic symptoms Stable on an appropriate medical regimen Normal ICD function See Pace Art report No changes today he is not device dependant today We will again try to enroll in ICM device clinic (he would not answer calls for Margarita Grizzle last year)  3. afib afib burden is <1 % He is on eliquis  4. Obesity Body mass index is 38.49 kg/m. Lifestyle modification is advised  Risks, benefits and potential toxicities for medications prescribed and/or refilled reviewed with patient today.   Return to see EP PA in a year  [name] MD, [facility] 10/15/2020 2:46 PM

## 2020-10-15 NOTE — Patient Instructions (Addendum)
Medication Instructions:  Your physician recommends that you continue on your current medications as directed. Please refer to the Current Medication list given to you today.  Labwork: None ordered.  Testing/Procedures: None ordered.  Follow-Up: Your physician wants you to follow-up in: one year with Marcus Kilts, PA.   You will receive a reminder letter in the mail two months in advance. If you don't receive a letter, please call our office to schedule the follow-up appointment.  Remote monitoring is used to monitor your ICD from home. This monitoring reduces the number of office visits required to check your device to one time per year. It allows [place] to keep an eye on the functioning of your device to ensure it is working properly. You are scheduled for a device check from home on 11/17/2020. You may send your transmission at any time that day. If you have a wireless device, the transmission will be sent automatically. After your physician reviews your transmission, you will receive a postcard with your next transmission date.  Any Other Special Instructions Will Be Listed Below (If Applicable).  If you need a refill on your cardiac medications before your next appointment, please call your pharmacy.    DASH Eating Plan DASH stands for "Dietary Approaches to Stop Hypertension." The DASH eating plan is a healthy eating plan that has been shown to reduce high blood pressure (hypertension). It may also reduce your risk for type 2 diabetes, heart disease, and stroke. The DASH eating plan may also help with weight loss. What are tips for following this plan?  General guidelines  Avoid eating more than 2,300 mg (milligrams) of salt (sodium) a day. If you have hypertension, you may need to reduce your sodium intake to 1,500 mg a day.  Limit alcohol intake to no more than 1 drink a day for nonpregnant women and 2 drinks a day for men. One drink equals 12 oz of beer, 5 oz of wine, or 1 oz of hard  liquor.  Work with your health care provider to maintain a healthy body weight or to lose weight. Ask what an ideal weight is for you.  Get at least 30 minutes of exercise that causes your heart to beat faster (aerobic exercise) most days of the week. Activities may include walking, swimming, or biking.  Work with your health care provider or diet and nutrition specialist (dietitian) to adjust your eating plan to your individual calorie needs. Reading food labels   Check food labels for the amount of sodium per serving. Choose foods with less than 5 percent of the Daily Value of sodium. Generally, foods with less than 300 mg of sodium per serving fit into this eating plan.  To find whole grains, look for the word "whole" as the first word in the ingredient list. Shopping  Buy products labeled as "low-sodium" or "no salt added."  Buy fresh foods. Avoid canned foods and premade or frozen meals. Cooking  Avoid adding salt when cooking. Use salt-free seasonings or herbs instead of table salt or sea salt. Check with your health care provider or pharmacist before using salt substitutes.  Do not fry foods. Cook foods using healthy methods such as baking, boiling, grilling, and broiling instead.  Cook with heart-healthy oils, such as olive, canola, soybean, or sunflower oil. Meal planning  Eat a balanced diet that includes: ? 5 or more servings of fruits and vegetables each day. At each meal, try to fill half of your plate with fruits and vegetables. ?  Up to 6-8 servings of whole grains each day. ? Less than 6 oz of lean meat, poultry, or fish each day. A 3-oz serving of meat is about the same size as a deck of cards. One egg equals 1 oz. ? 2 servings of low-fat dairy each day. ? A serving of nuts, seeds, or beans 5 times each week. ? Heart-healthy fats. Healthy fats called Omega-3 fatty acids are found in foods such as flaxseeds and coldwater fish, like sardines, salmon, and  mackerel.  Limit how much you eat of the following: ? Canned or prepackaged foods. ? Food that is high in trans fat, such as fried foods. ? Food that is high in saturated fat, such as fatty meat. ? Sweets, desserts, sugary drinks, and other foods with added sugar. ? Full-fat dairy products.  Do not salt foods before eating.  Try to eat at least 2 vegetarian meals each week.  Eat more home-cooked food and less restaurant, buffet, and fast food.  When eating at a restaurant, ask that your food be prepared with less salt or no salt, if possible. What foods are recommended? The items listed may not be a complete list. Talk with your dietitian about what dietary choices are best for you. Grains Whole-grain or whole-wheat bread. Whole-grain or whole-wheat pasta. Brown rice. [place]. Bulgur. Whole-grain and low-sodium cereals. Pita bread. Low-fat, low-sodium crackers. Whole-wheat flour tortillas. Vegetables Fresh or frozen vegetables (raw, steamed, roasted, or grilled). Low-sodium or reduced-sodium tomato and vegetable juice. Low-sodium or reduced-sodium tomato sauce and tomato paste. Low-sodium or reduced-sodium canned vegetables. Fruits All fresh, dried, or frozen fruit. Canned fruit in natural juice (without added sugar). Meat and other protein foods Skinless chicken or [place]. Ground chicken or [place]. Pork with fat trimmed off. Fish and seafood. Egg whites. Dried beans, peas, or lentils. Unsalted nuts, nut butters, and seeds. Unsalted canned beans. Lean cuts of beef with fat trimmed off. Low-sodium, lean deli meat. Dairy Low-fat (1%) or fat-free (skim) milk. Fat-free, low-fat, or reduced-fat cheeses. Nonfat, low-sodium ricotta or cottage cheese. Low-fat or nonfat yogurt. Low-fat, low-sodium cheese. Fats and oils Soft margarine without trans fats. Vegetable oil. Low-fat, reduced-fat, or light mayonnaise and salad dressings (reduced-sodium). Canola, safflower, olive, soybean, and  sunflower oils. Avocado. Seasoning and other foods Herbs. Spices. Seasoning mixes without salt. Unsalted popcorn and pretzels. Fat-free sweets. What foods are not recommended? The items listed may not be a complete list. Talk with your dietitian about what dietary choices are best for you. Grains Baked goods made with fat, such as croissants, muffins, or some breads. Dry pasta or rice meal packs. Vegetables Creamed or fried vegetables. Vegetables in a cheese sauce. Regular canned vegetables (not low-sodium or reduced-sodium). Regular canned tomato sauce and paste (not low-sodium or reduced-sodium). Regular tomato and vegetable juice (not low-sodium or reduced-sodium). Angie Fava. Olives. Fruits Canned fruit in a light or heavy syrup. Fried fruit. Fruit in cream or butter sauce. Meat and other protein foods Fatty cuts of meat. Ribs. Fried meat. Berniece Salines. Sausage. Bologna and other processed lunch meats. Salami. Fatback. Hotdogs. Bratwurst. Salted nuts and seeds. Canned beans with added salt. Canned or smoked fish. Whole eggs or egg yolks. Chicken or [place] with skin. Dairy Whole or 2% milk, cream, and half-and-half. Whole or full-fat cream cheese. Whole-fat or sweetened yogurt. Full-fat cheese. Nondairy creamers. Whipped toppings. Processed cheese and cheese spreads. Fats and oils Butter. Stick margarine. Lard. Shortening. Ghee. Bacon fat. Tropical oils, such as coconut, palm kernel, or palm oil.  Seasoning and other foods Salted popcorn and pretzels. Onion salt, garlic salt, seasoned salt, table salt, and sea salt. Worcestershire sauce. Tartar sauce. Barbecue sauce. Teriyaki sauce. Soy sauce, including reduced-sodium. Steak sauce. Canned and packaged gravies. Fish sauce. Oyster sauce. Cocktail sauce. Horseradish that you find on the shelf. Ketchup. Mustard. Meat flavorings and tenderizers. Bouillon cubes. Hot sauce and Tabasco sauce. Premade or packaged marinades. Premade or packaged taco seasonings.  Relishes. Regular salad dressings. Where to find more information:  National Heart, Lung, and [place]: https://wilson-eaton.com/  American Heart Association: www.heart.org Summary  The DASH eating plan is a healthy eating plan that has been shown to reduce high blood pressure (hypertension). It may also reduce your risk for type 2 diabetes, heart disease, and stroke.  With the DASH eating plan, you should limit salt (sodium) intake to 2,300 mg a day. If you have hypertension, you may need to reduce your sodium intake to 1,500 mg a day.  When on the DASH eating plan, aim to eat more fresh fruits and vegetables, whole grains, lean proteins, low-fat dairy, and heart-healthy fats.  Work with your health care provider or diet and nutrition specialist (dietitian) to adjust your eating plan to your individual calorie needs. This information is not intended to replace advice given to you by your health care provider. Make sure you discuss any questions you have with your health care provider. Document Revised: 09/22/2017 Document Reviewed: 10/03/2016 Elsevier Patient Education  2020 [employer].

## 2020-10-21 ENCOUNTER — Telehealth: Payer: Self-pay

## 2020-10-21 DIAGNOSIS — I5022 Chronic systolic (congestive) heart failure: Secondary | ICD-10-CM

## 2020-10-21 DIAGNOSIS — I480 Paroxysmal atrial fibrillation: Secondary | ICD-10-CM

## 2020-10-21 NOTE — Telephone Encounter (Signed)
Spoke with pt about recommendations per Georgie Chard, NP to have pt take lasix 20 mg bid for 3 days and then back to prior dosing. Georgie Chard would also like pt to have repeat blood work (bmet, pro bnp). Pt has been schedule for lab work on 1/3. Pt verbalized understanding and thanked me for the call.

## 2020-10-21 NOTE — Telephone Encounter (Signed)
-----   Message from Filbert Schilder, NP sent at 10/05/2020  5:56 PM EST ----- This patient's fluid level was elevated at last blood draw.  Lets have him do Lasix 40 mg twice daily x3 days then resume his prior dosing as he had done in the past.  He will need a repeat lab with proBNP and BMET ----- Message ----- From: Dyann Kief, PA-C Sent: 10/02/2020   9:28 AM EST To: Filbert Schilder, NP  Renea Ee, it looks like you saw this patient 10/01/20. I'll let you result this. thanks ----- Message ----- From: Interface, [facility] Lab Results In Sent: 10/02/2020   5:38 AM EST To: Dyann Kief, PA-C

## 2020-10-26 ENCOUNTER — Other Ambulatory Visit: Payer: Self-pay

## 2020-10-26 ENCOUNTER — Other Ambulatory Visit: Payer: Medicare HMO

## 2020-10-26 DIAGNOSIS — I5022 Chronic systolic (congestive) heart failure: Secondary | ICD-10-CM

## 2020-10-26 DIAGNOSIS — I255 Ischemic cardiomyopathy: Secondary | ICD-10-CM | POA: Diagnosis not present

## 2020-10-26 DIAGNOSIS — I519 Heart disease, unspecified: Secondary | ICD-10-CM | POA: Diagnosis not present

## 2020-10-26 DIAGNOSIS — I480 Paroxysmal atrial fibrillation: Secondary | ICD-10-CM | POA: Diagnosis not present

## 2020-10-26 DIAGNOSIS — E7849 Other hyperlipidemia: Secondary | ICD-10-CM

## 2020-10-26 DIAGNOSIS — R0609 Other forms of dyspnea: Secondary | ICD-10-CM

## 2020-10-26 DIAGNOSIS — R0600 Dyspnea, unspecified: Secondary | ICD-10-CM

## 2020-10-26 DIAGNOSIS — I2510 Atherosclerotic heart disease of native coronary artery without angina pectoris: Secondary | ICD-10-CM | POA: Diagnosis not present

## 2020-10-26 DIAGNOSIS — I10 Essential (primary) hypertension: Secondary | ICD-10-CM

## 2020-10-26 LAB — BASIC METABOLIC PANEL
BUN/Creatinine Ratio: 22 (ref 10–24)
BUN: 25 mg/dL (ref 8–27)
CO2: 24 mmol/L (ref 20–29)
Calcium: 9 mg/dL (ref 8.6–10.2)
Chloride: 103 mmol/L (ref 96–106)
Creatinine, Ser: 1.15 mg/dL (ref 0.76–1.27)
GFR calc Af Amer: 70 mL/min/{1.73_m2} (ref >59)
GFR calc non Af Amer: 60 mL/min/{1.73_m2} (ref >59)
Glucose: 115 mg/dL — ABNORMAL HIGH (ref 65–99)
Potassium: 4.2 mmol/L (ref 3.5–5.2)
Sodium: 140 mmol/L (ref 134–144)

## 2020-10-26 LAB — PRO B NATRIURETIC PEPTIDE: NT-Pro BNP: 658 pg/mL — ABNORMAL HIGH (ref 0–486)

## 2020-10-26 NOTE — Progress Notes (Signed)
Virtual Visit via Telephone Note   This visit type was conducted due to national recommendations for restrictions regarding the COVID-19 Pandemic (e.g. social distancing) in an effort to limit this patient's exposure and mitigate transmission in our community.  Due to his co-morbid illnesses, this patient is at least at moderate risk for complications without adequate follow up.  This format is felt to be most appropriate for this patient at this time.  The patient did not have access to video technology/had technical difficulties with video requiring transitioning to audio format only (telephone).  All issues noted in this document were discussed and addressed.  No physical exam could be performed with this format.  Please refer to the patient's chart for his  consent to telehealth for [facility].   The patient was identified using 2 identifiers.  Date:  10/29/2020   ID:  [name], DOB [birth year], MRN [id number]  Patient Location: Home Provider Location: Office/Clinic  PCP:  Orpah Melter, MD  Cardiologist:  Ena Dawley, MD  Electrophysiologist:  [name], MD   Evaluation Performed:  Follow-Up Visit  Chief Complaint:  F/u CHF  History of Present Illness:    [name] is a 80 y.o. male with mixed cardiomyopathy/chronic systolic CHF, [employer] Jude BiV ICD implantation 08/2015, CAD s/p DES to LCx 2016, left bundle branch block, PVCs, VF (ICD therapy 2018), PVCs, PAF, obesity, hypertension, hyperlipidemia, bilateral lymphedema s/p treatment, Merkel cell cancer status post chemo/radiation, acid reflux, mild chronic appearing thrombocytopenia, mild dilation of ascending aorta who presents for follow-up.    The patient initially established care with Dr. Meda Coffee in 2016 for shortness of breath. Stress test showed no ischemia but 2D echocardiogram had shown LV dysfunction EF 30 to 35%. He underwent cardiac catheterization with staged PCI to the circumflex 03/2015. Dr. Aundra Dubin  felt that his CAD could be contributing to his cardiomyopathy though was not the sole cause. cMRI showed diffuse HK, mild MR, "and mid inferolateral wall and partially of the basal and mid inferior wall associated with 75-100% late gadolinium enhancement consistent with prior myocardial infarction and no change of recovery if revascularized." Despite PCI and medication optimization, his LV dysfunction persisted so he has had a BiV ICD implanted. He also has a history of VF/PVCs with previous commentary from Dr. Rayann Heman that if he had recurrence, could consider sotalol versus ablation. He has also had atrial fibrillation observed on his device and has been following with the Afib clinic. He was subsequently started on Eliquis. He has chronic R>L lower extremity edema after having those lymph nodes removed.  I recently saw him in clinic for the first time at which he was reporting general increase in DOE for about 6 months. He had not been to the gym recently in the context of Covid, so suspected multifactorial. Nuclear stress test was performed with result below - reviewed with Dr. Meda Coffee who did not feel there was evidence of new ischemia, only scar. 2D echo 09/15/20 also outlined below, EF 40-45% (slightly improved from prior 35-40%), mild LVH, mild dilation of ascending aorta. He was trialed on higher dose of Lasix. Case reviewed with Dr. Meda Coffee who suggested med titration. This was initially deferred as the patient had URI symptoms and ruled out for Covid. He was seen back in follow-up by Kathyrn Drown twice. Losartan was changed to Entresto and carvedilol was decreased to allow BP room for this. He was seen in telemed f/u 12/15 at which time he hadn't been able  to start Entresto due to pharmacy being out of the medicine. Lasix was increased to 40mg  BID. He was seen by Dr. Rayann Heman on 10/15/20 and felt to be doing well on that regimen.  He is seen back for follow-up today and is feeling well. He is currently  tolerating the regimen below well. He still has some residual dyspnea but reports he feels like this is related to general inactivity and remaining overweight rather than dramatic shortness of breath. No chest pain. He wears compression hose for his chronic edema related to lymph nodes as above, no increase reported recently. His wife is in the midst of breast cancer treatment.  Labs Independently Reviewed 10/26/20 pBNP 658, K 4.2, Cr 1.15 08/2020 Hgb 13.7, plt 133, LDL 46, LFTs wnl  Past Medical History:  Diagnosis Date  . Acid reflux   . Biventricular ICD (implantable cardioverter-defibrillator) in place   . CAD (coronary artery disease)    a. PCI to Circ with DES on 04/06/15.  . Cardiomyopathy ([place])    a. mixed picture - suspected NICM but cannot exclude contribution from CAD.  Marland Kitchen Chronic systolic CHF (congestive heart failure) ([place])   . Degenerative disc disease, thoracic   . Essential hypertension   . Hyperlipidemia   . Hyperplastic colon polyp   . LBBB (left bundle branch block)   . Merkel cell cancer ([place]) 05/2005; 07-2005   s/p gluteal resection, lymph node dissection, chemo/ radiation  . Mild dilation of ascending aorta ([facility])   . Nonischemic cardiomyopathy ([place])    a. s/p STJ CRTD 07/2015  . Obesity   . PAF (paroxysmal atrial fibrillation) ([place])   . Paroxysmal atrial fibrillation ([facility])    a. identified on device interrogation. All episodes <3 hours  . PVC's (premature ventricular contractions)   . Ventricular fibrillation ([place])    a. appropriate ICD therapy 02/2017   Past Surgical History:  Procedure Laterality Date  . CARDIAC CATHETERIZATION N/A 04/06/2015   Procedure: Right/Left Heart Cath and Coronary Angiography;  Surgeon: Larey Dresser, MD;  Location: [place] [facility];  Service: Cardiovascular;  Laterality: N/A;  . CARDIAC CATHETERIZATION N/A 04/06/2015   Procedure: Coronary Stent Intervention;  Surgeon: Peter M Martinique, MD;  Location: [place] [facility];  Service:  Cardiovascular;  Laterality: N/A;  . CATARACT EXTRACTION W/ INTRAOCULAR LENS  IMPLANT, BILATERAL Bilateral 11/2014  . COLONOSCOPY  2009  . EP IMPLANTABLE DEVICE N/A 08/04/2015   STJ CRTD implanted by Dr Rayann Heman for primary prevention/CHF  . KNEE CARTILAGE SURGERY Left 1960  . SKIN CANCER EXCISION  05/2005; 07/2005   excision of merkel cell  . TONSILLECTOMY       Current Meds  Medication Sig  . acetaminophen (TYLENOL) 325 MG tablet Take 650 mg by mouth every 6 (six) hours as needed for mild pain or moderate pain.  Marland Kitchen atorvastatin (LIPITOR) 80 MG tablet Take 1 tablet (80 mg total) by mouth daily. Please keep upcoming appt in October with Dr. Meda Coffee for future refills. Thank you  . carvedilol (COREG) 6.25 MG tablet Take 1 tablet (6.25 mg total) by mouth 2 (two) times daily.  Marland Kitchen ELIQUIS 5 MG TABS tablet TAKE 1 TABLET BY MOUTH TWICE A DAY  . fluorouracil (EFUDEX) 5 % cream Apply 5 application topically daily as needed (Patches on face).   . furosemide (LASIX) 40 MG tablet Take 1 tablet (40 mg total) by mouth 2 (two) times daily.  Marland Kitchen levothyroxine (SYNTHROID, LEVOTHROID) 50 MCG tablet Take 50 mcg by mouth  daily.  Marland Kitchen POTASSIUM GLUCONATE PO Take 550 mg by mouth daily (Potassium supplement).   . sacubitril-valsartan (ENTRESTO) 24-26 MG Take 1 tablet by mouth 2 (two) times daily.  . SODIUM FLUORIDE 5000 PPM 1.1 % PSTE Take by mouth.  . spironolactone (ALDACTONE) 25 MG tablet Take 0.5 tablets (12.5 mg total) by mouth daily.     Allergies:   Wound dressing adhesive and Tape   Social History   Tobacco Use  . Smoking status: Former Smoker    Packs/day: 0.25    Years: 15.00    Pack years: 3.75    Types: Cigarettes    Quit date: 10/25/1983    Years since quitting: 37.0  . Smokeless tobacco: Never Used  Vaping Use  . Vaping Use: Never used  Substance Use Topics  . Alcohol use: Yes    Alcohol/week: 1.0 standard drink    Types: 1 Cans of beer per week    Comment: 08/04/2015 "I doubt if I average a  glass of wine and 1 beer q week"  . Drug use: No     Family Hx: The patient's family history includes Cancer in his father and mother; Lupus in his mother; Stroke in his sister; Thyroid disease in his sister. There is no history of Heart attack or Hypertension.  ROS:   Please see the history of present illness.    All other systems reviewed and are negative.   Prior CV studies:   The following studies were reviewed today:  NST 09/15/20  Nuclear stress EF: 29%.  There was no ST segment deviation noted during stress.  The left ventricular ejection fraction is severely decreased (<30%).  Defect 1: There is a large defect of severe severity present in the basal inferoseptal, basal inferior, basal inferolateral, mid inferolateral and apical inferior location.  Findings consistent with prior myocardial infarction with peri-infarct ischemia.  This is a high risk study.   Abnormal study, high risk due to EF and area size of abnormality. Large, severe defect seen at basal inferior/inferolateral/inferoseptal segments as well as mid inferolateral and apical inferior. This is largely fixed with a small amount of reversibility. There is also wall motion abnormality in this area. This is concerning for infarct with peri-infarct ischemia.  09/15/20 2D echo IMPRESSIONS    1. Left ventricular ejection fraction, by estimation, is 40 to 45%, very  difficult to assess due to image quality, abnormal septal motion and  frequent ectopy. The left ventricle has mildly decreased function. Left  ventricular endocardial border not  optimally defined to evaluate regional wall motion. There is mild left  ventricular hypertrophy. Left ventricular diastolic parameters are  indeterminate.  2. Right ventricular systolic function is normal. The right ventricular  size is normal. Tricuspid regurgitation signal is inadequate for assessing  PA pressure.  3. Left atrial size was moderately dilated.  4.  The mitral valve is grossly normal. Trivial mitral valve  regurgitation. No evidence of mitral stenosis.  5. The aortic valve was not well visualized. Aortic valve regurgitation  is not visualized. No aortic stenosis is present.  6. Aortic dilatation noted. There is mild dilatation of the ascending  aorta, measuring 40 mm.  7. The inferior vena cava is normal in size with greater than 50%  respiratory variability, suggesting right atrial pressure of 3 mmHg.    Labs/Other Tests and Data Reviewed:    EKG:  An ECG dated 10/15/20 was personally reviewed today and demonstrated:  per Dr. Rayann Heman, sinus and BiV paced.  PVCs also noted  Recent Labs: 06/16/2020: TSH 2.327 08/25/2020: ALT 19; Hemoglobin 13.7; Platelets 133 10/26/2020: BUN 25; Creatinine, Ser 1.15; NT-Pro BNP 658; Potassium 4.2; Sodium 140   Recent Lipid Panel Lab Results  Component Value Date/Time   CHOL 107 08/25/2020 01:03 PM   TRIG 59 08/25/2020 01:03 PM   HDL 48 08/25/2020 01:03 PM   CHOLHDL 2.2 08/25/2020 01:03 PM   CHOLHDL 1.9 05/04/2016 10:01 AM   LDLCALC 46 08/25/2020 01:03 PM    Wt Readings from Last 3 Encounters:  10/29/20 268 lb (121.6 kg)  10/15/20 276 lb (125.2 kg)  10/07/20 269 lb (122 kg)     Objective:    Vital Signs:  BP 117/65   Pulse 81   Ht 5\' 11"  (1.803 m)   Wt 268 lb (121.6 kg)   SpO2 96%   BMI 37.38 kg/m    VS reviewed. General - calm M in no acute distress Pulm - No labored breathing, no coughing during visit, no audible wheezing, speaking in full sentences Neuro - A+Ox3, no slurred speech, answers questions appropriately Psych - Pleasant affect  ASSESSMENT & PLAN:    1. Chronic combined CHF - recently seen for med titration and tolerating well. SBP has been running 102-1teens so would not push up much further at this time. Weight appears down 8lb comparing prior office value to home scale. The patient truly feels his residual dyspnea is moreso related to inactivity at this point. We  discussed proceeding with further workup but at this time he feels he would like to trial gradually reintroducing activity to build endurance to see how he feels first. He agrees to keep apprised of any unexpected functional limitation or lack of improvement. Labs reviewed from 1/3 and stable - Dr. [place] felt they were stable as well, and recommended continuation of current regimen. He also is struggling with general weight loss. We discussed referral to healthy weight and wellness center and he is agreeable.  2. Paroxysmal atrial fibrillation - AF burden is followed by Dr. Delton See, most recently maintaining NSR. Continue Eliquis. Updated Hgb/plt count below. Would consider repeating CBC when he returns for follow-up in a few months.  3. History of VF and PVCs - this is monitored by EP. Most recent K 4.2. He takes an OTC potassium supplement and given normal values I've asked him to continue on exactly what he's been taking. Continue BB therapy. Dose recently adjusted by previous APP and patient is tolerating this well. Dr. Johney Frame recently saw patient and recommended no further changes at that time. They are also able to track PVC burden on his device.  4. CAD - no recent chest pain. Nuc stress test result reviewed above. Not on ASA due to concomitant Eliquis. Continue BB, statin.  5. Mild dilation of ascending aorta - continue BP control as we are doing. Anticipate repeat echo will be needed around 08/2021 - consider ordering at next follow-up OV.   Time:   Today, I have spent 13 minutes with the patient with telehealth technology discussing the above problems.     Medication Adjustments/Labs and Tests Ordered: Current medicines are reviewed at length with the patient today.  Testing and concerns regarding medicines are outlined above.    Follow Up:  In Person 4 months with me, 09/2021 or Dr. Georgie Chard.  Signed, Delton See, PA-C  10/29/2020 3:32 PM    [facility]

## 2020-10-29 ENCOUNTER — Encounter: Payer: Self-pay | Admitting: *Deleted

## 2020-10-29 ENCOUNTER — Telehealth: Payer: Self-pay | Admitting: *Deleted

## 2020-10-29 ENCOUNTER — Encounter: Payer: Self-pay | Admitting: Physician Assistant

## 2020-10-29 ENCOUNTER — Other Ambulatory Visit: Payer: Self-pay

## 2020-10-29 ENCOUNTER — Telehealth (INDEPENDENT_AMBULATORY_CARE_PROVIDER_SITE_OTHER): Payer: Medicare HMO | Admitting: Physician Assistant

## 2020-10-29 VITALS — BP 117/65 | HR 81 | Ht 71.0 in | Wt 268.0 lb

## 2020-10-29 DIAGNOSIS — Z8679 Personal history of other diseases of the circulatory system: Secondary | ICD-10-CM | POA: Diagnosis not present

## 2020-10-29 DIAGNOSIS — I493 Ventricular premature depolarization: Secondary | ICD-10-CM | POA: Diagnosis not present

## 2020-10-29 DIAGNOSIS — I2510 Atherosclerotic heart disease of native coronary artery without angina pectoris: Secondary | ICD-10-CM

## 2020-10-29 DIAGNOSIS — I77810 Thoracic aortic ectasia: Secondary | ICD-10-CM

## 2020-10-29 DIAGNOSIS — I5042 Chronic combined systolic (congestive) and diastolic (congestive) heart failure: Secondary | ICD-10-CM | POA: Diagnosis not present

## 2020-10-29 DIAGNOSIS — I480 Paroxysmal atrial fibrillation: Secondary | ICD-10-CM | POA: Diagnosis not present

## 2020-10-29 NOTE — Telephone Encounter (Signed)
  Patient Consent for Virtual Visit         [name] has provided verbal consent on 10/29/2020 for a virtual visit (video or telephone).   CONSENT FOR VIRTUAL VISIT FOR:  [name]  By participating in this virtual visit I agree to the following:  I hereby voluntarily request, consent and authorize [facility] and its employed or contracted physicians, physician assistants, nurse practitioners or other licensed health care professionals (the Practitioner), to provide me with telemedicine health care services (the "Services") as deemed necessary by the treating Practitioner. I acknowledge and consent to receive the Services by the Practitioner via telemedicine. I understand that the telemedicine visit will involve communicating with the Practitioner through live audiovisual communication technology and the disclosure of certain medical information by electronic transmission. I acknowledge that I have been given the opportunity to request an in-person assessment or other available alternative prior to the telemedicine visit and am voluntarily participating in the telemedicine visit.  I understand that I have the right to withhold or withdraw my consent to the use of telemedicine in the course of my care at any time, without affecting my right to future care or treatment, and that the Practitioner or I may terminate the telemedicine visit at any time. I understand that I have the right to inspect all information obtained and/or recorded in the course of the telemedicine visit and may receive copies of available information for a reasonable fee.  I understand that some of the potential risks of receiving the Services via telemedicine include:  Marland Kitchen Delay or interruption in medical evaluation due to technological equipment failure or disruption; . Information transmitted may not be sufficient (e.g. poor resolution of images) to allow for appropriate medical decision making by the  Practitioner; and/or  . In rare instances, security protocols could fail, causing a breach of personal health information.  Furthermore, I acknowledge that it is my responsibility to provide information about my medical history, conditions and care that is complete and accurate to the best of my ability. I acknowledge that Practitioner's advice, recommendations, and/or decision may be based on factors not within their control, such as incomplete or inaccurate data provided by me or distortions of diagnostic images or specimens that may result from electronic transmissions. I understand that the practice of medicine is not an exact science and that Practitioner makes no warranties or guarantees regarding treatment outcomes. I acknowledge that a copy of this consent can be made available to me via my patient portal [facility] MyChart), or I can request a printed copy by calling the office of [facility].    I understand that my insurance will be billed for this visit.   I have read or had this consent read to me. . I understand the contents of this consent, which adequately explains the benefits and risks of the Services being provided via telemedicine.  . I have been provided ample opportunity to ask questions regarding this consent and the Services and have had my questions answered to my satisfaction. . I give my informed consent for the services to be provided through the use of telemedicine in my medical care

## 2020-10-29 NOTE — Patient Instructions (Addendum)
Medication Instructions:  Your physician recommends that you continue on your current medications as directed. Please refer to the Current Medication list given to you today.  *If you need a refill on your cardiac medications before your next appointment, please call your pharmacy*   Lab Work: None ordered  If you have labs (blood work) drawn today and your tests are completely normal, you will receive your results only by: Marland Kitchen MyChart Message (if you have MyChart) OR . A paper copy in the mail If you have any lab test that is abnormal or we need to change your treatment, we will call you to review the results.   Testing/Procedures: None ordered   You have been referred to [facility].  They will call you to schedule an appointment.   Follow-Up: At [facility], you and your health needs are our priority.  As part of our continuing mission to provide you with exceptional heart care, we have created designated Provider Care Teams.  These Care Teams include your primary Cardiologist (physician) and Advanced Practice Providers (APPs -  Physician Assistants and Nurse Practitioners) who all work together to provide you with the care you need, when you need it.  We recommend signing up for the patient portal called "MyChart".  Sign up information is provided on this After Visit Summary.  MyChart is used to connect with patients for Virtual Visits (Telemedicine).  Patients are able to view lab/test results, encounter notes, upcoming appointments, etc.  Non-urgent messages can be sent to your provider as well.   To learn more about what you can do with MyChart, go to [email or web address].    Your next appointment:   4 month(s)    03/04/2021 ARRIVE AT 10:00 TO SEE DR. Delton See  The format for your next appointment:   In Person  Provider:   You may see Tobias Alexander, MD or one of the following Advanced Practice Providers on your designated Care Team:    Ronie Spies,  [place]  Georgie Chard, NP    Other Instructions

## 2020-11-09 ENCOUNTER — Telehealth: Payer: Self-pay

## 2020-11-09 NOTE — Telephone Encounter (Signed)
Patient returned call.  Provided ICM referral intro.  Patient agreeable to monthly ICM follow up if insurance covers the cost. Advised he can check with his insurance or we schedule and process a remote on 12/07/2020 to determine what the insurance will cover.  He said that would be fine.  Advised will schedule remote transmission for 12/07/2020.

## 2020-11-09 NOTE — Telephone Encounter (Signed)
-----   Message from Damian Leavell, RN sent at 10/15/2020  3:03 PM EST ----- Regarding: can you try to get him enrolled with ICM again?

## 2020-11-09 NOTE — Telephone Encounter (Addendum)
Patient referred to [facility] clinic by Dr Rayann Heman after last office visit.  Had attempted to enroll patient in [facility] clinic in 2017 but he did not feel it was necessary to monitor monthly fluid levels.   Attempted ICM enrollment call today to offer enrollment for 2nd time but no answer.

## 2020-11-17 ENCOUNTER — Ambulatory Visit (INDEPENDENT_AMBULATORY_CARE_PROVIDER_SITE_OTHER): Payer: Medicare HMO

## 2020-11-17 DIAGNOSIS — I5042 Chronic combined systolic (congestive) and diastolic (congestive) heart failure: Secondary | ICD-10-CM

## 2020-11-17 DIAGNOSIS — I480 Paroxysmal atrial fibrillation: Secondary | ICD-10-CM | POA: Diagnosis not present

## 2020-11-18 LAB — CUP PACEART REMOTE DEVICE CHECK
Battery Remaining Longevity: 25 mo
Battery Remaining Percentage: 32 %
Battery Voltage: 2.9 V
Brady Statistic AP VP Percent: 3.8 %
Brady Statistic AP VS Percent: 1 %
Brady Statistic AS VP Percent: 83 %
Brady Statistic AS VS Percent: 9.3 %
Brady Statistic RA Percent Paced: 1 %
Date Time Interrogation Session: 20220126122521
HighPow Impedance: 78 Ohm
HighPow Impedance: 78 Ohm
Implantable Lead Implant Date: 20161011
Implantable Lead Implant Date: 20161011
Implantable Lead Implant Date: 20161011
Implantable Lead Location: 753858
Implantable Lead Location: 753859
Implantable Lead Location: 753860
Implantable Pulse Generator Implant Date: 20161011
Lead Channel Impedance Value: 350 Ohm
Lead Channel Impedance Value: 390 Ohm
Lead Channel Impedance Value: 550 Ohm
Lead Channel Pacing Threshold Amplitude: 0.5 V
Lead Channel Pacing Threshold Amplitude: 0.625 V
Lead Channel Pacing Threshold Amplitude: 0.75 V
Lead Channel Pacing Threshold Pulse Width: 0.5 ms
Lead Channel Pacing Threshold Pulse Width: 0.5 ms
Lead Channel Pacing Threshold Pulse Width: 0.8 ms
Lead Channel Sensing Intrinsic Amplitude: 3.6 mV
Lead Channel Sensing Intrinsic Amplitude: 7.4 mV
Lead Channel Setting Pacing Amplitude: 2 V
Lead Channel Setting Pacing Amplitude: 2 V
Lead Channel Setting Pacing Amplitude: 2 V
Lead Channel Setting Pacing Pulse Width: 0.5 ms
Lead Channel Setting Pacing Pulse Width: 0.8 ms
Lead Channel Setting Sensing Sensitivity: 0.5 mV
Pulse Gen Serial Number: 7276664

## 2020-11-19 ENCOUNTER — Telehealth: Payer: Self-pay

## 2020-11-19 NOTE — Telephone Encounter (Signed)
Follow up transmission received.  Appears to be still in AF.  Forwarding to MD since pt asymptomatic.

## 2020-11-19 NOTE — Telephone Encounter (Signed)
Scheduled remote transmission received- Pt in AF since approx 1/23.  AF burden rising, currently at 12%.   Current medications include- Carvedilol 6.25mg , Eliquis 5mg    Spoke with pt.  He was not aware of being in AF, he denies any new or worsening symptoms.  He has had SOB but that has been on-going for months per his report.    Pt is not at home right now, he will send a manual transmission when he returns home to determine if rhythm has converted.

## 2020-11-24 NOTE — Telephone Encounter (Signed)
Called and spoke with patient.  He declined any appt prior to his wife's surgery on 12/02/20.  He requested appt be made for week of 12/07/20.  He is aware of appt 12/08/20 @10 :30 am with Adline Peals, PA.

## 2020-11-24 NOTE — Telephone Encounter (Signed)
Please schedule AF clinic visit for persistence of afib.

## 2020-11-28 NOTE — Progress Notes (Signed)
Remote ICD transmission.   

## 2020-12-02 ENCOUNTER — Other Ambulatory Visit: Payer: Self-pay | Admitting: Cardiology

## 2020-12-02 MED ORDER — ENTRESTO 24-26 MG PO TABS: 1.0000 | ORAL_TABLET | Freq: Two times a day (BID) | ORAL | 3 refills | Status: DC

## 2020-12-07 ENCOUNTER — Ambulatory Visit (INDEPENDENT_AMBULATORY_CARE_PROVIDER_SITE_OTHER): Payer: Medicare HMO

## 2020-12-07 DIAGNOSIS — I5042 Chronic combined systolic (congestive) and diastolic (congestive) heart failure: Secondary | ICD-10-CM

## 2020-12-07 DIAGNOSIS — Z95810 Presence of automatic (implantable) cardiac defibrillator: Secondary | ICD-10-CM

## 2020-12-07 NOTE — Progress Notes (Signed)
Primary Care Physician: Orpah Melter, MD Primary Cardiologist: Dr Meda Coffee Primary Electrophysiologist: Dr Rayann Heman Referring Physician: Device Clinic   [name] is a 80 y.o. 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 The patient was initially diagnosed with atrial fibrillation in 2018 on device interrogation. Patient has a CHADS2VASC score of 5. His afib burden has been very low and anticoagulation was not started. The device clinic received an alert for a prolonged episode of afib lasting ~6 hours. Patient states his wife had fallen and required emergency treatment just before the onset of his afib. He denies any awareness of his arrhythmia.   On follow up today, the device clinic received a report for persistent afib since 11/15/20. He states that he may have had a "funny feeling in my chest" but otherwise was asymptomatic. He is back in SR today. He does reports some stress at home with his wife's health.   Today, he denies symptoms of palpitations, chest pain, shortness of breath, orthopnea, PND, lower extremity edema, dizziness, presyncope, syncope, snoring, daytime somnolence, bleeding, or neurologic sequela. The patient is tolerating medications without difficulties and is otherwise without complaint today.    Atrial Fibrillation Risk Factors:  he does not have symptoms or diagnosis of sleep apnea. he does not have a history of rheumatic fever.   he has a BMI of Body mass index is 37.6 kg/m.Marland Kitchen Filed Weights   12/08/20 1029  Weight: 122.3 kg    Family History  Problem Relation Age of Onset  . Cancer Mother        breast  . Lupus Mother   . Cancer Father        prostate  . Stroke Sister   . Thyroid disease Sister   . Heart attack Neg Hx   . Hypertension Neg Hx      Atrial  Fibrillation Management history:  Previous antiarrhythmic drugs: none Previous cardioversions: none Previous ablations: none CHADS2VASC score: 5 Anticoagulation history: Eliquis   Past Medical History:  Diagnosis Date  . Acid reflux   . Biventricular ICD (implantable cardioverter-defibrillator) in place   . CAD (coronary artery disease)    a. PCI to Circ with DES on 04/06/15.  . Cardiomyopathy ([place])    a. mixed picture - suspected NICM but cannot exclude contribution from CAD.  Marland Kitchen Chronic systolic CHF (congestive heart failure) ([place])   . Degenerative disc disease, thoracic   . Essential hypertension   . Hyperlipidemia   . Hyperplastic colon polyp   . LBBB (left bundle branch block)   . Merkel cell cancer ([place]) 05/2005; 07-2005   s/p gluteal resection, lymph node dissection, chemo/ radiation  . Mild dilation of ascending aorta (H[facility])   . Nonischemic cardiomyopathy ([place])    a. s/p STJ CRTD 07/2015  . Obesity   . PAF (paroxysmal atrial fibrillation) ([place])   . Paroxysmal atrial fibrillation (H[facility])    a. identified on device interrogation. All episodes <3 hours  . PVC's (premature ventricular contractions)   . Ventricular fibrillation ([place])    a. appropriate ICD therapy 02/2017   Past Surgical History:  Procedure Laterality Date  . CARDIAC CATHETERIZATION N/A 04/06/2015   Procedure: Right/Left Heart Cath and Coronary Angiography;  Surgeon: Larey Dresser, MD;  Location: [facility];  Service: Cardiovascular;  Laterality: N/A;  .  CARDIAC CATHETERIZATION N/A 04/06/2015   Procedure: Coronary Stent Intervention;  Surgeon: Peter M Martinique, MD;  Location: [facility];  Service: Cardiovascular;  Laterality: N/A;  . CATARACT EXTRACTION W/ INTRAOCULAR LENS  IMPLANT, BILATERAL Bilateral 11/2014  . COLONOSCOPY  2009  . EP IMPLANTABLE DEVICE N/A 08/04/2015   STJ CRTD implanted by Dr Rayann Heman for primary prevention/CHF  . KNEE CARTILAGE SURGERY Left 1960  . SKIN CANCER EXCISION   05/2005; 07/2005   excision of merkel cell  . TONSILLECTOMY      Current Outpatient Medications  Medication Sig Dispense Refill  . acetaminophen (TYLENOL) 325 MG tablet Take 650 mg by mouth every 6 (six) hours as needed for mild pain or moderate pain.    Marland Kitchen atorvastatin (LIPITOR) 80 MG tablet Take 1 tablet (80 mg total) by mouth daily. Please keep upcoming appt in October with Dr. Meda Coffee for future refills. Thank you 90 tablet 0  . carvedilol (COREG) 6.25 MG tablet Take 1 tablet (6.25 mg total) by mouth 2 (two) times daily. 180 tablet 3  . ELIQUIS 5 MG TABS tablet TAKE 1 TABLET BY MOUTH TWICE A DAY 60 tablet 5  . fluorouracil (EFUDEX) 5 % cream Apply 5 application topically daily as needed (Patches on face).   0  . furosemide (LASIX) 40 MG tablet Take 1 tablet (40 mg total) by mouth 2 (two) times daily. 180 tablet 3  . levothyroxine (SYNTHROID, LEVOTHROID) 50 MCG tablet Take 50 mcg by mouth daily.  5  . POTASSIUM PO Take 550 mg by mouth daily.    . sacubitril-valsartan (ENTRESTO) 24-26 MG Take 1 tablet by mouth 2 (two) times daily. 180 tablet 3  . SODIUM FLUORIDE 5000 PPM 1.1 % PSTE Take by mouth.    . spironolactone (ALDACTONE) 25 MG tablet Take 0.5 tablets (12.5 mg total) by mouth daily. 45 tablet 3   No current facility-administered medications for this encounter.   Facility-Administered Medications Ordered in Other Encounters  Medication Dose Route Frequency Provider Last Rate Last Admin  . adenosine (diagnostic) (ADENOSCAN) infusion 68.4 mg  0.56 mg/kg Intravenous Once Lelon Perla, MD        Allergies  Allergen Reactions  . Wound Dressing Adhesive Dermatitis  . Tape Other (See Comments)    Rash    Social History   Socioeconomic History  . Marital status: Married    Spouse name: bridgette  . Number of children: 2  . Years of education: college  . Highest education level: Not on file  Occupational History  . 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  Smoking status: Former Smoker    Packs/day: 0.25    Years: 15.00    Pack years: 3.75    Types: Cigarettes    Quit date: 10/25/1983    Years since quitting: 37.1  . Smokeless tobacco: Never Used  Vaping Use  . Vaping Use: Never used  Substance and Sexual Activity  . Alcohol use: Yes    Alcohol/week: 1.0 standard drink    Types: 1 Cans of beer per week    Comment: 08/04/2015 "I doubt if I average a glass of wine and 1 beer q week"  . Drug use: No  . Sexual activity: Not Currently  Other Topics Concern  . Not on file  Social History Narrative   Pt lives in [place], married x 45 years.  2 grown sons have HTN.   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  Physical Exam: Vitals:   12/08/20 1029  BP: 102/62  Pulse: 84  Weight: 122.3 kg  Height: 5\' 11"  (1.803 m)    GEN- The patient is well appearing elderly obese male, alert and oriented x 3 today.   HEENT-head normocephalic, atraumatic, sclera clear, conjunctiva pink, hearing intact, trachea midline. Lungs- Clear to ausculation bilaterally, normal work of breathing Heart- Regular rate and rhythm, occasional ectopic beats, no murmurs, rubs or gallops  GI- soft, NT, ND, + BS Extremities- no clubbing, cyanosis, or edema MS- no significant deformity or atrophy Skin- no rash or lesion Psych- euthymic mood, full affect Neuro- strength and sensation are intact   Wt Readings from Last 3 Encounters:  12/08/20 122.3 kg  10/29/20 121.6 kg  10/15/20 125.2 kg    EKG today demonstrates  A sense V paced rhythm, PVCs Vent. rate 84 BPM PR interval 106 ms QRS duration 196 ms QT/QTc  456/538 ms  Echo 09/15/20 demonstrated  1. Left ventricular ejection fraction, by estimation, is 40 to 45%, very  difficult to assess due to image quality, abnormal septal motion and  frequent ectopy. The left ventricle has mildly decreased function. Left  ventricular endocardial border not  optimally defined to evaluate regional wall motion. There is mild left  ventricular hypertrophy. Left ventricular diastolic parameters are  indeterminate.  2. Right ventricular systolic function is normal. The right ventricular  size is normal. Tricuspid regurgitation signal is inadequate for assessing  PA pressure.  3. Left atrial size was moderately dilated.  4. The mitral valve is grossly normal. Trivial mitral valve  regurgitation. No evidence of mitral stenosis.  5. The aortic valve was not well visualized. Aortic valve regurgitation  is not visualized. No aortic stenosis is present.  6. Aortic dilatation noted. There is mild dilatation of the ascending  aorta, measuring 40 mm.  7. The inferior vena cava is normal in size with greater than 50%  respiratory variability, suggesting right atrial pressure of 3 mmHg.   Epic records are reviewed at length today  CHA2DS2-VASc Score = 5  The patient's score is based upon: CHF History: Yes HTN History: Yes Diabetes History: No Stroke History: No Vascular Disease History: Yes      ASSESSMENT AND PLAN: 1. Persistent atrial fibrillation  The patient's CHA2DS2-VASc score is 5, indicating a 7.2% annual risk of stroke.   Patient has spontaneously converted back to SR. Overall burden is low prior to this most recent episode. Will continue present therapy for now.  Continue Eliquis 5 mg BID Continue Coreg 12.5 mg BID   2. Secondary Hypercoagulable State (ICD10:  D68.69) The patient is at significant risk for stroke/thromboembolism based upon his CHA2DS2-VASc Score of 5.  Continue Apixaban (Eliquis).   3. Obesity Body mass index is 37.6  kg/m. Lifestyle modification was discussed and encouraged including regular physical activity and weight reduction.  4. VF arrest S/p ICD, followed by Dr Rayann Heman and the device clinic.  5. CAD No anginal symptoms.  6. Chronic systolic CHF/ICM EF 46-80% No signs or symptoms of fluid overload today.  7. HTN Stable, no changes today.   Follow up  with Dr Meda Coffee as scheduled and with Dr Rayann Heman per recall.    [facility] [address] [place], [place] [address] [phone] 12/08/2020 10:40 AM

## 2020-12-08 ENCOUNTER — Ambulatory Visit (HOSPITAL_COMMUNITY)
Admission: RE | Admit: 2020-12-08 | Discharge: 2020-12-08 | Disposition: A | Discharge status: Home / Self Care | Payer: Medicare HMO | Source: Ambulatory Visit | Attending: Physician Assistant | Admitting: Physician Assistant

## 2020-12-08 ENCOUNTER — Other Ambulatory Visit: Payer: Self-pay

## 2020-12-08 ENCOUNTER — Telehealth: Payer: Self-pay

## 2020-12-08 ENCOUNTER — Encounter (HOSPITAL_COMMUNITY): Payer: Self-pay | Admitting: Physician Assistant

## 2020-12-08 VITALS — BP 102/62 | HR 84 | Ht 71.0 in | Wt 269.6 lb

## 2020-12-08 DIAGNOSIS — Z95810 Presence of automatic (implantable) cardiac defibrillator: Secondary | ICD-10-CM | POA: Diagnosis not present

## 2020-12-08 DIAGNOSIS — D6869 Other thrombophilia: Secondary | ICD-10-CM | POA: Diagnosis not present

## 2020-12-08 DIAGNOSIS — Z87891 Personal history of nicotine dependence: Secondary | ICD-10-CM | POA: Insufficient documentation

## 2020-12-08 DIAGNOSIS — I2510 Atherosclerotic heart disease of native coronary artery without angina pectoris: Secondary | ICD-10-CM | POA: Insufficient documentation

## 2020-12-08 DIAGNOSIS — E669 Obesity, unspecified: Secondary | ICD-10-CM | POA: Diagnosis not present

## 2020-12-08 DIAGNOSIS — I110 Hypertensive heart disease with heart failure: Secondary | ICD-10-CM | POA: Insufficient documentation

## 2020-12-08 DIAGNOSIS — Z6837 Body mass index (BMI) 37.0-37.9, adult: Secondary | ICD-10-CM | POA: Insufficient documentation

## 2020-12-08 DIAGNOSIS — I4819 Other persistent atrial fibrillation: Secondary | ICD-10-CM | POA: Diagnosis not present

## 2020-12-08 DIAGNOSIS — I5022 Chronic systolic (congestive) heart failure: Secondary | ICD-10-CM | POA: Insufficient documentation

## 2020-12-08 DIAGNOSIS — I493 Ventricular premature depolarization: Secondary | ICD-10-CM | POA: Insufficient documentation

## 2020-12-08 NOTE — Telephone Encounter (Signed)
Attempted ICM call to patient. Left message requesting to send remote transmission via home monitor.  Left phone number for return call.

## 2020-12-09 NOTE — Telephone Encounter (Signed)
Spoke with patient and assisted with remote transmission which was received.  See ICM note.

## 2020-12-09 NOTE — Progress Notes (Signed)
EPIC Encounter for ICM Monitoring  Patient Name: [name] is a 80 y.o. male Date: 12/09/2020 Primary Care Physican: Orpah Melter, MD Primary Cardiologist: Meda Coffee Electrophysiologist: Allred Bi-V Pacing: 79%  12/08/2020 Office Weight: 269.6 lbs   AT/AF Burden: 15% (taking Eliquis)        1st ICM remote transmission follow up.  Heart Failure questions reviewed.  Pt reports chronic leg swelling but no worse than usual.    He was seen in Afib clinic 2/15 for increased AF/AF burden and was in NSR at that time per Epic note.   Corvue thoracic impedance normal but was suggesting possible fluid accumulation from 12/05/20 - 12/07/20.   Prescribed:   Furosemide 40 mg take 1 tablet twice a day  Spironolactone 25 mg take 0.5 tablet once a day.  Recommendations:   Encouraged to call if experiencing fluid symptoms.  Follow-up plan: ICM clinic phone appointment on 01/11/2021.   91 day device clinic remote transmission 02/16/2021.    EP/Cardiology Office Visits: 03/04/2021 with Dr. Meda Coffee.    Copy of ICM check sent to Dr. Rayann Heman.   3 month ICM trend: 12/09/2020.      1 Year ICM trend:       [name], RN 12/09/2020 1:48 PM

## 2020-12-25 DIAGNOSIS — Z23 Encounter for immunization: Secondary | ICD-10-CM | POA: Diagnosis not present

## 2020-12-25 DIAGNOSIS — E7800 Pure hypercholesterolemia, unspecified: Secondary | ICD-10-CM | POA: Diagnosis not present

## 2020-12-25 DIAGNOSIS — E039 Hypothyroidism, unspecified: Secondary | ICD-10-CM | POA: Diagnosis not present

## 2020-12-25 DIAGNOSIS — I509 Heart failure, unspecified: Secondary | ICD-10-CM | POA: Diagnosis not present

## 2020-12-25 DIAGNOSIS — I4901 Ventricular fibrillation: Secondary | ICD-10-CM | POA: Diagnosis not present

## 2020-12-25 DIAGNOSIS — I10 Essential (primary) hypertension: Secondary | ICD-10-CM | POA: Diagnosis not present

## 2020-12-25 DIAGNOSIS — I2510 Atherosclerotic heart disease of native coronary artery without angina pectoris: Secondary | ICD-10-CM | POA: Diagnosis not present

## 2020-12-25 DIAGNOSIS — Z0000 Encounter for general adult medical examination without abnormal findings: Secondary | ICD-10-CM | POA: Diagnosis not present

## 2021-01-11 ENCOUNTER — Ambulatory Visit (INDEPENDENT_AMBULATORY_CARE_PROVIDER_SITE_OTHER): Payer: Medicare HMO

## 2021-01-11 DIAGNOSIS — I5042 Chronic combined systolic (congestive) and diastolic (congestive) heart failure: Secondary | ICD-10-CM

## 2021-01-11 DIAGNOSIS — Z95810 Presence of automatic (implantable) cardiac defibrillator: Secondary | ICD-10-CM | POA: Diagnosis not present

## 2021-01-12 NOTE — Progress Notes (Signed)
EPIC Encounter for ICM Monitoring  Patient Name: [name] is a 80 y.o. male Date: 01/12/2021 Primary Care Physican: Orpah Melter, MD Primary Cardiologist: Meda Coffee Electrophysiologist: Allred Bi-V Pacing: 79%            01/12/2021 Office Weight: 259 lbs   AT/AF Burden: 11% (taking Eliquis)                                                            Heart Failure questions reviewed.  Pt reports chronic leg swelling and has noticed a little extra swelling and also in stomach area.  Urine output has been less in the last week even with taking Furosemide.     Pt's spouse has surgery last week and food was being brought in by neighbors but were foods high in salt.  They have resumed a low salt diet since discovering the foods were salty.    He would like to continue monthly ICM monitoring at this time.   Corvue thoracic impedance suggesting possible fluid accumulation starting 01/07/2021.   Prescribed:   Furosemide 40 mg take 1 tablet twice a day  Spironolactone 25 mg take 0.5 tablet once a day.  Recommendations:  Confirmed he is taking Furosemide 40 mg twice a day.   Advised to continue on a low salt diet and will call back if any recommendations are given by Dr Meda Coffee.   Follow-up plan: ICM clinic phone appointment on 01/20/2021 to recheck fluid levels.   91 day device clinic remote transmission 02/16/2021.    EP/Cardiology Office Visits: 03/09/2021 with Dr. Johney Frame.    Copy of ICM check sent to Dr. Rayann Heman and Dr Meda Coffee for review and recommendations if needed.   3 month ICM trend: 01/11/2021.    1 Year ICM trend:       [name], RN 01/12/2021 5:16 PM

## 2021-01-22 NOTE — Progress Notes (Signed)
No ICM remote transmission received for 01/20/2021 and next ICM transmission scheduled for 02/01/2021.

## 2021-02-01 ENCOUNTER — Ambulatory Visit (INDEPENDENT_AMBULATORY_CARE_PROVIDER_SITE_OTHER): Payer: Medicare HMO

## 2021-02-01 DIAGNOSIS — I5042 Chronic combined systolic (congestive) and diastolic (congestive) heart failure: Secondary | ICD-10-CM

## 2021-02-01 DIAGNOSIS — Z95810 Presence of automatic (implantable) cardiac defibrillator: Secondary | ICD-10-CM

## 2021-02-03 NOTE — Progress Notes (Signed)
EPIC Encounter for ICM Monitoring  Patient Name: RIAD WAGLEY is a 80 y.o. male Date: 02/03/2021 Primary Care Physican: Orpah Melter, MD Primary Cardiologist:Pemberton 1st visit 5/17 (replacing Meda Coffee) Electrophysiologist:Allred Bi-V Pacing:82% 3/22/2022OfficeWeight:259lbs   AT/AF Burden: 8.8% (taking Eliquis)    Patient reports eating foods high in salt over the past few days and aware this may cause fluid accumulation.   Corvue thoracic impedance returned to normal on 01/11/2021 but suggesting possible fluid accumulation returned on 4/8 but trending close to baseline on 02/03/2021.   Prescribed:   Furosemide40 mg take 1 tablet twice a day  Spironolactone 25 mg take 0.5 tablet once a day.  Labs: 10/26/2020 Creatinine 1.15, BUN 25, Potassium 4.2, Sodium 140, GFR 60-70 10/01/2020 Creatinine 1.06, BUN 17, Potassium 4.1, Sodium 139, GFR 66-77  08/25/2020 Creatinine 1.14, BUN 17, Potassium 4.3, Sodium 139, GFR 61-70  A complete set of results can be found in Results Review.  Recommendations:Recommendation to limit salt intake.  Encouraged to call if experiencing any fluid symptoms.   Follow-up plan: ICM clinic phone appointment on4/27/2022. 91 day device clinic remote transmission 02/16/2021.   EP/Cardiology Office Visits:03/09/2021 with Dr.Pemberton.   Copy of ICM check sent to Dr.Allred.   3 month ICM trend: 02/03/2021.    1 Year ICM trend:       [name], RN 02/03/2021 7:39 AM

## 2021-02-19 ENCOUNTER — Telehealth: Payer: Self-pay

## 2021-02-19 ENCOUNTER — Ambulatory Visit (INDEPENDENT_AMBULATORY_CARE_PROVIDER_SITE_OTHER): Payer: Medicare HMO

## 2021-02-19 DIAGNOSIS — Z95810 Presence of automatic (implantable) cardiac defibrillator: Secondary | ICD-10-CM

## 2021-02-19 DIAGNOSIS — I5042 Chronic combined systolic (congestive) and diastolic (congestive) heart failure: Secondary | ICD-10-CM

## 2021-02-19 NOTE — Progress Notes (Signed)
EPIC Encounter for ICM Monitoring  Patient Name: [name] is a 80 y.o. male Date: 02/19/2021 Primary Care Physican: Orpah Melter, MD Electrophysiologist:Allred Bi-V Pacing:82% 3/22/2022OfficeWeight:259lbs   AT/AF Burden: 8.1% (taking Eliquis)  Attempted call to patient and unable to reach.  Left detailed message per DPR regarding transmission. Transmission reviewed.   Corvue thoracic impedance suggesting normal fluid levels since 02/07/2021.   Prescribed:   Furosemide40 mg take 1 tablet twice a day  Spironolactone 25 mg take 0.5 tablet once a day.  Labs: 10/26/2020 Creatinine 1.15, BUN 25, Potassium 4.2, Sodium 140, GFR 60-70 10/01/2020 Creatinine 1.06, BUN 17, Potassium 4.1, Sodium 139, GFR 66-77  08/25/2020 Creatinine 1.14, BUN 17, Potassium 4.3, Sodium 139, GFR 61-70  A complete set of results can be found in Results Review.  Recommendations:Left voice mail with ICM number and encouraged to call if experiencing any fluid symptoms.  Follow-up plan: ICM clinic phone appointment on6/03/2021. 91 day device clinic remote transmission 05/18/2021.   EP/Cardiology Office Visits:03/09/2021 with Dr.Pemberton.  Copy of ICM check sent to Dr.Allred.  3 month ICM trend: 02/18/2021.    1 Year ICM trend:       [name], RN 02/19/2021 11:25 AM

## 2021-02-19 NOTE — Telephone Encounter (Signed)
Remote ICM transmission received.  Attempted call to patient regarding ICM remote transmission and left detailed message per DPR.  Advised to return call for any fluid symptoms or questions. Next ICM remote transmission scheduled 03/29/2021.     

## 2021-03-04 ENCOUNTER — Ambulatory Visit: Payer: Medicare HMO | Admitting: Cardiology

## 2021-03-05 NOTE — Progress Notes (Signed)
Cardiology Office Note:    Date:  03/09/2021   ID:  [name], DOB 1941/02/05, MRN [id number]  PCP:  Orpah Melter, MD   [facility] Providers Cardiologist:  Ena Dawley, MD (Inactive) Electrophysiologist:  [name], MD {   Referring MD: Orpah Melter, MD    History of Present Illness:    [name] is a 80 y.o. male with a hx of mixed cardiomyopathy/chronic systolic CHF,St. JudeBiV ICD implantation11/2016, CAD s/pDES to LCx 2016, left bundle branch block, PVCs, VF (ICD therapy 2018),PVCs, PAF,obesity, hypertension, hyperlipidemia, bilateral lymphedema s/p treatment, Merkel cell cancer status post chemo/radiation, acid reflux, mild chronic appearing thrombocytopenia, mild dilation of ascending aortawho presents for follow-up.   The patient initially established care with Dr. Meda Coffee in 2016 for shortness of breath.Stress test showed no ischemia but TTE had shown LV dysfunction EF 30 to 35%. He underwent cardiac catheterization with staged PCI to the circumflex 03/2015. Dr. Randa Ngo that his CAD could be contributing to his cardiomyopathy thoughwasnot the sole cause.cMRI showed diffuse HK, mild MR, "and mid inferolateral wall and partially of the basal and mid inferior wall associated with 75-100% late gadolinium enhancement consistent with prior myocardial infarction and no change of recovery if revascularized."Despite PCI and medication optimization, his LV dysfunction persisted so he has had a BiV ICD implanted.He also has a history of VF/PVCs and afib followed by Dr. Rayann Heman.He was subsequently started on Eliquis.   Last seen in Afib clinic with Clint Fenton on 12/08/20 where he had been in persistent Afib 11/15/20. Fortunately, he was relatively asymptomatic and had returned to NSR during his evaluation.  Today, he is feeling okay other than still feeling short of breath. This has been occurring for about a year, and is mainly caused by activity  including gardening or walking around. At rest he does not feel short of breath. He is not sure if the shortness of breath correlates with Afib, since he does not know when he is in or out of Afib. He denies any chest pain or palpitations. No headaches, lightheadedness, or syncope to report. Also has no orthopnea or PND. He is not having any bleeding issues on his medication.  He has some right LE lymphedema that he attributes to his past cancer treatments. Lately he is somewhat successful with losing weight. He used to go to the [facility] once a week prior to Covid, and wishes to return to this baseline.  Of note his insurance does not carry Entresto and he will have difficulty affording his medication. When he is out of rhythm he will receive a call notifying him of this.  Past Medical History:  Diagnosis Date  . Acid reflux   . Biventricular ICD (implantable cardioverter-defibrillator) in place   . CAD (coronary artery disease)    a. PCI to Circ with DES on 04/06/15.  . Cardiomyopathy ([place])    a. mixed picture - suspected NICM but cannot exclude contribution from CAD.  Marland Kitchen Chronic systolic CHF (congestive heart failure) ([place])   . Degenerative disc disease, thoracic   . Essential hypertension   . Hyperlipidemia   . Hyperplastic colon polyp   . LBBB (left bundle branch block)   . Merkel cell cancer ([place]) 05/2005; 07-2005   s/p gluteal resection, lymph node dissection, chemo/ radiation  . Mild dilation of ascending aorta (HCC)   . Nonischemic cardiomyopathy ([place])    a. s/p STJ CRTD 07/2015  . Obesity   . PAF (paroxysmal atrial fibrillation) ([place])   .  Paroxysmal atrial fibrillation (HCC)    a. identified on device interrogation. All episodes <3 hours  . PVC's (premature ventricular contractions)   . Ventricular fibrillation ([place])    a. appropriate ICD therapy 02/2017    Past Surgical History:  Procedure Laterality Date  . CARDIAC CATHETERIZATION N/A 04/06/2015   Procedure: Right/Left Heart  Cath and Coronary Angiography;  Surgeon: Larey Dresser, MD;  Location: [place] [facility];  Service: Cardiovascular;  Laterality: N/A;  . CARDIAC CATHETERIZATION N/A 04/06/2015   Procedure: Coronary Stent Intervention;  Surgeon: Peter M Martinique, MD;  Location: [place] [facility];  Service: Cardiovascular;  Laterality: N/A;  . CATARACT EXTRACTION W/ INTRAOCULAR LENS  IMPLANT, BILATERAL Bilateral 11/2014  . COLONOSCOPY  2009  . EP IMPLANTABLE DEVICE N/A 08/04/2015   STJ CRTD implanted by Dr Rayann Heman for primary prevention/CHF  . KNEE CARTILAGE SURGERY Left 1960  . SKIN CANCER EXCISION  05/2005; 07/2005   excision of merkel cell  . TONSILLECTOMY      Current Medications: Current Meds  Medication Sig  . acetaminophen (TYLENOL) 325 MG tablet Take 650 mg by mouth every 6 (six) hours as needed for mild pain or moderate pain.  Marland Kitchen atorvastatin (LIPITOR) 80 MG tablet Take 1 tablet (80 mg total) by mouth daily. Please keep upcoming appt in October with Dr. Meda Coffee for future refills. Thank you  . carvedilol (COREG) 6.25 MG tablet Take 1 tablet (6.25 mg total) by mouth 2 (two) times daily.  . dapagliflozin propanediol (FARXIGA) 10 MG TABS tablet Take 1 tablet (10 mg total) by mouth daily before breakfast.  . ELIQUIS 5 MG TABS tablet TAKE 1 TABLET BY MOUTH TWICE A DAY  . fluorouracil (EFUDEX) 5 % cream Apply 5 application topically daily as needed (Patches on face).   . furosemide (LASIX) 20 MG tablet Take 2 tablets (40 mg total) by mouth in the AM, then take 1 tablet (20 mg total) by mouth in the PM, everyday.  Marland Kitchen levothyroxine (SYNTHROID, LEVOTHROID) 50 MCG tablet Take 50 mcg by mouth daily.  Marland Kitchen POTASSIUM PO Take 550 mg by mouth daily.  . sacubitril-valsartan (ENTRESTO) 24-26 MG Take 1 tablet by mouth 2 (two) times daily.  . SODIUM FLUORIDE 5000 PPM 1.1 % PSTE Take by mouth.  . spironolactone (ALDACTONE) 25 MG tablet Take 0.5 tablets (12.5 mg total) by mouth daily.     Allergies:   Wound dressing adhesive  and Tape   Social History   Socioeconomic History  . Marital status: Married    Spouse name: bridgette  . Number of children: 2  . Years of education: college  . Highest education level: Not on file  Occupational History  . 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 Smoking status: Former Smoker    Packs/day: 0.25    Years: 15.00    Pack years: 3.75    Types: Cigarettes    Quit date: 10/25/1983    Years since quitting: 37.3  . Smokeless tobacco: Never Used  Vaping Use  . Vaping Use: Never used  Substance and Sexual Activity  . Alcohol use: Yes    Alcohol/week: 1.0 standard drink    Types: 1 Cans of beer per week    Comment: 08/04/2015 "I doubt if I average a glass of wine and 1 beer q week"  . Drug use: No  . Sexual activity: Not Currently  Other Topics Concern  . Not on file  Social History Narrative   Pt lives in [place],  married x 45 years.  2 grown sons have HTN.   Works as a Higher education careers adviser business)   Social Determinants of Radio broadcast assistant Strain: Not on [employer] Insecurity: Not on file  Transportation Needs: Not on file  Physical Activity: Not on file  Stress: Not on file  Social Connections: Not on file     Family History: The patient's family history includes Cancer in his father and mother; Lupus in his mother; Stroke in his sister; Thyroid disease in his sister. There is no history of Heart attack or Hypertension.  ROS:   Please see the history of present illness.    Review of Systems  Constitutional: Negative for chills, fever and malaise/fatigue.  HENT: Negative for ear pain, nosebleeds and sore throat.   Eyes: Negative for blurred vision, discharge and redness.  Respiratory: Positive for shortness of breath. Negative for cough and hemoptysis.   Cardiovascular: Positive for leg swelling. Negative for chest pain, palpitations, orthopnea, claudication and PND.  Gastrointestinal: Negative for blood in stool,  diarrhea, nausea and vomiting.  Genitourinary: Negative for flank pain and hematuria.  Musculoskeletal: Negative for falls, joint pain and myalgias.  Neurological: Negative for dizziness, loss of consciousness and headaches.  Endo/Heme/Allergies: Does not bruise/bleed easily.  Psychiatric/Behavioral: Negative for depression. The patient is not nervous/anxious and does not have insomnia.      EKGs/Labs/Other Studies Reviewed:    The following studies were reviewed today:  Lexiscan Myoview 09/15/2020:  Nuclear stress EF: 29%.  There was no ST segment deviation noted during stress.  The left ventricular ejection fraction is severely decreased (<30%).  Defect 1: There is a large defect of severe severity present in the basal inferoseptal, basal inferior, basal inferolateral, mid inferolateral and apical inferior location.  Findings consistent with prior myocardial infarction with peri-infarct ischemia.  This is a high risk study.   Abnormal study, high risk due to EF and area size of abnormality. Large, severe defect seen at basal inferior/inferolateral/inferoseptal segments as well as mid inferolateral and apical inferior. This is largely fixed with a small amount of reversibility. There is also wall motion abnormality in this area. This is concerning for infarct with peri-infarct ischemia.   Echo 09/15/2020: 1. Left ventricular ejection fraction, by estimation, is 40 to 45%, very  difficult to assess due to image quality, abnormal septal motion and  frequent ectopy. The left ventricle has mildly decreased function. Left  ventricular endocardial border not  optimally defined to evaluate regional wall motion. There is mild left  ventricular hypertrophy. Left ventricular diastolic parameters are  indeterminate.  2. Right ventricular systolic function is normal. The right ventricular  size is normal. Tricuspid regurgitation signal is inadequate for assessing  PA pressure.  3.  Left atrial size was moderately dilated.  4. The mitral valve is grossly normal. Trivial mitral valve  regurgitation. No evidence of mitral stenosis.  5. The aortic valve was not well visualized. Aortic valve regurgitation  is not visualized. No aortic stenosis is present.  6. Aortic dilatation noted. There is mild dilatation of the ascending  aorta, measuring 40 mm.  7. The inferior vena cava is normal in size with greater than 50%  respiratory variability, suggesting right atrial pressure of 3 mmHg.  EKG:   03/09/2021: EKG is not ordered today.  Recent Labs: 06/16/2020: TSH 2.327 08/25/2020: ALT 19; Hemoglobin 13.7; Platelets 133 10/26/2020: BUN 25; Creatinine, Ser 1.15; NT-Pro BNP 658; Potassium 4.2; Sodium 140  Recent Lipid Panel  Component Value Date/Time   CHOL 107 08/25/2020 1303   TRIG 59 08/25/2020 1303   HDL 48 08/25/2020 1303   CHOLHDL 2.2 08/25/2020 1303   CHOLHDL 1.9 05/04/2016 1001   VLDL 10 05/04/2016 1001   LDLCALC 46 08/25/2020 1303     Risk Assessment/Calculations:    CHA2DS2-VASc Score = 5  This indicates a 7.2% annual risk of stroke. The patient's score is based upon: CHF History: Yes HTN History: Yes Diabetes History: No Stroke History: No Vascular Disease History: Yes     Physical Exam:    VS:  BP 116/60   Pulse 65   Ht 5\' 11"  (1.803 m)   Wt 264 lb 6.4 oz (119.9 kg)   SpO2 97%   BMI 36.88 kg/m     Wt Readings from Last 3 Encounters:  03/09/21 264 lb 6.4 oz (119.9 kg)  12/08/20 269 lb 9.6 oz (122.3 kg)  10/29/20 268 lb (121.6 kg)     GEN: Elderly male, NAD HEENT: Normal NECK: No JVD; No carotid bruits CARDIAC: RRR, no murmurs, rubs, gallops RESPIRATORY:  Clear to auscultation without rales, wheezing or rhonchi  ABDOMEN: Soft, non-tender, non-distended MUSCULOSKELETAL:  1+ right LE lymphedema (chronic). No LLE edema. Extremities warm SKIN: Warm and dry NEUROLOGIC:  Alert and oriented x 3 PSYCHIATRIC:  Normal affect    ASSESSMENT:    1. Chronic combined systolic and diastolic CHF (congestive heart failure) ([place])   2. Biventricular ICD (implantable cardioverter-defibrillator) in place   3. CAD in native artery   4. Chronic systolic CHF (congestive heart failure) (HCC)   5. Other hyperlipidemia   6. Medication management   7. Hyperlipidemia LDL goal <70    PLAN:    In order of problems listed above:  #Chronic Combined Systolic and Diastolic Heart Failure: TTE with LVEF 40-45% on TTE 09/15/21. ICD in place. Currently compensated with NYHA class II symptoms. Has chronic dyspnea on exertion that has been ongoing for 1 year. Not progressing. Weight stable. Myoview with large inferior/inferoseptal/inferolateral with minimal peri-infarct ischemia.  -Will check BNP and BMET today given SOB -Continue coreg 6.25mg  BID -Continue entresto 24-26mg  BID -Continue spiro 12.5mg  daily -Change lasix to 40mg  qAM; 20mg  qPM given addition of farxiga -Start farxiga 10mg  daily -Low Na diet -Monitor daily weights  #Chronic DOE: Has been ongoing for 1 year. Myoview with large inferior/inferoseptal/inferolateral infarct without significant peri-infarct ischemia. TTE with LVEF 40-45%. Suspect symptoms may be related to deconditioning as appears overall euvolemic on exam. Will check BNP today. -Check BNP -Continue management of HFrEF as above -Encouarge graduated exercise and weight loss -If fails to improve with above, may need to consider repeat LHC  #Paroxysmal Afib: CHADs-vasc 5. Currently regular rhythm on exam. Tolerating AC without issues. -Continue coreg 6.25mg  BID -Continue apixaban 5mg  BID -Follow-up with Afib clinic as scheduled  #History of VT and PVCs: Follows with Dr. Rayann Heman. -Continue coreg 6.25mg  BID  #Coronary Artery Disease: S/p DES to LCx in 2016. Myoview in 08/2020 withlarge inferior/inferoseptal/inferolateral without significant peri-infarct ischemia.  -Continue atorvastatin 80mg   daily -Myoview with infarct without significant ischemia; if SOB persists, may need to consider repeat LHC  #HLD: LDL 46 on 08/2020. -Continue lipitor 80mg  daily  #HTN: Controlled. -Continue coreg 6.25mg  BID -Continue entresto 24-26mg  BID  #Ascending aorta dilation: Measures 16mm on TTE 08/2020. -Will need yearly CT/MR or TTEs for monitoring -Blood pressure control as above   Follow-up in 3 months.  Medication Adjustments/Labs and Tests Ordered: Current medicines are reviewed at length  with the patient today.  Concerns regarding medicines are outlined above.  Orders Placed This Encounter  Procedures  . Basic metabolic panel  . Pro b natriuretic peptide  . Magnesium  . Lipid Profile   Meds ordered this encounter  Medications  . dapagliflozin propanediol (FARXIGA) 10 MG TABS tablet    Sig: Take 1 tablet (10 mg total) by mouth daily before breakfast.    Dispense:  30 tablet    Refill:  6  . furosemide (LASIX) 20 MG tablet    Sig: Take 2 tablets (40 mg total) by mouth in the AM, then take 1 tablet (20 mg total) by mouth in the PM, everyday.    Dispense:  270 tablet    Refill:  1    Dose decrease    Patient Instructions  Medication Instructions:   START TAKING FARXIGA 10 MG BY MOUTH DAILY BEFORE BREAKFAST  DECREASE YOUR LASIX TO TAKING 40 MG BY MOUTH IN THE AM, THEN TAKE 20 MG BY MOUTH IN THE PM EVERYDAY  *If you need a refill on your cardiac medications before your next appointment, please call your pharmacy*   Lab Work:  1) TODAY--BMET, PRO-BNP, AND MAGNESIUM LEVEL  2) IN 3 MONTHS--SAME DAY AS YOU SEE THE PROVIDER IN CLINIC--CHECK LIPIDS--PLEASE COME FASTING TO THIS LAB APPOINTMENT  If you have labs (blood work) drawn today and your tests are completely normal, you will receive your results only by: Marland Kitchen MyChart Message (if you have MyChart) OR . A paper copy in the mail If you have any lab test that is abnormal or we need to change your treatment, we will call  you to review the results.   Follow-Up:  3 MONTHS WITH AN EXTENDER OR DR. Johney Frame IN THE OFFICE--YOU WILL NEED YOUR LAB DONE TO CHECK LIPIDS SAME DAY AS THIS APPOINTMENT       I,Mathew Stumpf,acting as a scribe for Freada Bergeron, MD.,have documented all relevant documentation on the behalf of Freada Bergeron, MD,as directed by  Freada Bergeron, MD while in the presence of Freada Bergeron, MD.  I, Freada Bergeron, MD, have reviewed all documentation for this visit. The documentation on 03/09/21 for the exam, diagnosis, procedures, and orders are all accurate and complete.  Signed, Freada Bergeron, MD  03/09/2021 10:16 AM    [place]

## 2021-03-09 ENCOUNTER — Encounter: Payer: Self-pay | Admitting: Cardiology

## 2021-03-09 ENCOUNTER — Telehealth: Payer: Self-pay

## 2021-03-09 ENCOUNTER — Ambulatory Visit: Payer: Medicare HMO | Admitting: Cardiology

## 2021-03-09 ENCOUNTER — Other Ambulatory Visit: Payer: Self-pay

## 2021-03-09 VITALS — BP 116/60 | HR 65 | Ht 71.0 in | Wt 264.4 lb

## 2021-03-09 DIAGNOSIS — E7849 Other hyperlipidemia: Secondary | ICD-10-CM | POA: Diagnosis not present

## 2021-03-09 DIAGNOSIS — I5022 Chronic systolic (congestive) heart failure: Secondary | ICD-10-CM | POA: Diagnosis not present

## 2021-03-09 DIAGNOSIS — Z95810 Presence of automatic (implantable) cardiac defibrillator: Secondary | ICD-10-CM

## 2021-03-09 DIAGNOSIS — Z8679 Personal history of other diseases of the circulatory system: Secondary | ICD-10-CM

## 2021-03-09 DIAGNOSIS — I5042 Chronic combined systolic (congestive) and diastolic (congestive) heart failure: Secondary | ICD-10-CM | POA: Diagnosis not present

## 2021-03-09 DIAGNOSIS — Z79899 Other long term (current) drug therapy: Secondary | ICD-10-CM | POA: Diagnosis not present

## 2021-03-09 DIAGNOSIS — I480 Paroxysmal atrial fibrillation: Secondary | ICD-10-CM | POA: Diagnosis not present

## 2021-03-09 DIAGNOSIS — I2510 Atherosclerotic heart disease of native coronary artery without angina pectoris: Secondary | ICD-10-CM | POA: Diagnosis not present

## 2021-03-09 DIAGNOSIS — E785 Hyperlipidemia, unspecified: Secondary | ICD-10-CM | POA: Diagnosis not present

## 2021-03-09 MED ORDER — DAPAGLIFLOZIN PROPANEDIOL 10 MG PO TABS: 10.0000 mg | ORAL_TABLET | Freq: Every day | ORAL | 6 refills | Status: DC

## 2021-03-09 MED ORDER — FUROSEMIDE 20 MG PO TABS: ORAL_TABLET | ORAL | 1 refills | Status: DC

## 2021-03-09 NOTE — Telephone Encounter (Signed)
**Note De-Identified Nour Scalise Obfuscation** I started a [place] PA through covermymeds and received the following message: GRIFFEN FRAYNE Key: [id number] Outcome: The patient currently has access to the requested medication and a Prior Authorization is not needed for the patient/medication. Drug: Wilder Glade 10MG  tablets Form [employer] Electronic PA Form [phone] NCPDP)

## 2021-03-09 NOTE — Telephone Encounter (Addendum)
**Note De-identified Toussaint Golson Obfuscation** -----  **Note De-Identified Jakhiya Brower Obfuscation** Message from Nuala Alpha, LPN sent at 8/41/3244  9:07 AM EDT ----- Regarding: Marcus Glade PA PER [place] We just saw this pt in clinic and Dr. Johney Frame started him on Farxiga 10 mg and he will need a PA done on this med.

## 2021-03-09 NOTE — Patient Instructions (Signed)
Medication Instructions:   START TAKING FARXIGA 10 MG BY MOUTH DAILY BEFORE BREAKFAST  DECREASE YOUR LASIX TO TAKING 40 MG BY MOUTH IN THE AM, THEN TAKE 20 MG BY MOUTH IN THE PM EVERYDAY  *If you need a refill on your cardiac medications before your next appointment, please call your pharmacy*   Lab Work:  1) TODAY--BMET, PRO-BNP, AND MAGNESIUM LEVEL  2) IN 3 MONTHS--SAME DAY AS YOU SEE THE PROVIDER IN CLINIC--CHECK LIPIDS--PLEASE COME FASTING TO THIS LAB APPOINTMENT  If you have labs (blood work) drawn today and your tests are completely normal, you will receive your results only by: Marland Kitchen MyChart Message (if you have MyChart) OR . A paper copy in the mail If you have any lab test that is abnormal or we need to change your treatment, we will call you to review the results.   Follow-Up:  3 MONTHS WITH AN EXTENDER OR DR. Johney Frame IN THE OFFICE--YOU WILL NEED YOUR LAB DONE TO CHECK LIPIDS SAME DAY AS THIS APPOINTMENT

## 2021-03-10 ENCOUNTER — Telehealth: Payer: Self-pay | Admitting: *Deleted

## 2021-03-10 DIAGNOSIS — Z79899 Other long term (current) drug therapy: Secondary | ICD-10-CM

## 2021-03-10 DIAGNOSIS — R7989 Other specified abnormal findings of blood chemistry: Secondary | ICD-10-CM

## 2021-03-10 DIAGNOSIS — I10 Essential (primary) hypertension: Secondary | ICD-10-CM

## 2021-03-10 LAB — BASIC METABOLIC PANEL
BUN/Creatinine Ratio: 26 — ABNORMAL HIGH (ref 10–24)
BUN: 35 mg/dL — ABNORMAL HIGH (ref 8–27)
CO2: 23 mmol/L (ref 20–29)
Calcium: 9.4 mg/dL (ref 8.6–10.2)
Chloride: 101 mmol/L (ref 96–106)
Creatinine, Ser: 1.35 mg/dL — ABNORMAL HIGH (ref 0.76–1.27)
Glucose: 103 mg/dL — ABNORMAL HIGH (ref 65–99)
Potassium: 4.3 mmol/L (ref 3.5–5.2)
Sodium: 140 mmol/L (ref 134–144)
eGFR: 53 mL/min/{1.73_m2} — ABNORMAL LOW (ref >59)

## 2021-03-10 LAB — PRO B NATRIURETIC PEPTIDE: NT-Pro BNP: 689 pg/mL — ABNORMAL HIGH (ref 0–486)

## 2021-03-10 LAB — MAGNESIUM: Magnesium: 2.4 mg/dL — ABNORMAL HIGH (ref 1.6–2.3)

## 2021-03-10 NOTE — Telephone Encounter (Signed)
Pt aware of lab results and recommendations per Dr. Johney Frame.  Scheduled the pt to come in for repeat BMET in 2 weeks on 03/24/21.  Pt verbalized understanding and agrees with this plan.

## 2021-03-10 NOTE — Telephone Encounter (Signed)
-----   Message from Freada Bergeron, MD sent at 03/10/2021  9:13 AM EDT ----- Labs show that his fluid levels are stable from prior checks. His kidney function is a little elevated but we just decreased his lasix to 40mg  in the AM and 20mg  in the PM which may help. He should continue the current medications including the new medicine farxiga. Can we re-check his BMET in 2 weeks to ensure unchanged/stable?

## 2021-03-24 ENCOUNTER — Other Ambulatory Visit: Payer: Medicare HMO

## 2021-03-25 ENCOUNTER — Other Ambulatory Visit: Payer: Self-pay

## 2021-03-25 ENCOUNTER — Other Ambulatory Visit: Payer: Medicare HMO | Admitting: *Deleted

## 2021-03-25 DIAGNOSIS — R7989 Other specified abnormal findings of blood chemistry: Secondary | ICD-10-CM

## 2021-03-25 DIAGNOSIS — Z79899 Other long term (current) drug therapy: Secondary | ICD-10-CM | POA: Diagnosis not present

## 2021-03-25 DIAGNOSIS — I10 Essential (primary) hypertension: Secondary | ICD-10-CM

## 2021-03-25 LAB — BASIC METABOLIC PANEL
BUN/Creatinine Ratio: 17 (ref 10–24)
BUN: 24 mg/dL (ref 8–27)
CO2: 25 mmol/L (ref 20–29)
Calcium: 9.4 mg/dL (ref 8.6–10.2)
Chloride: 102 mmol/L (ref 96–106)
Creatinine, Ser: 1.39 mg/dL — ABNORMAL HIGH (ref 0.76–1.27)
Glucose: 101 mg/dL — ABNORMAL HIGH (ref 65–99)
Potassium: 4.4 mmol/L (ref 3.5–5.2)
Sodium: 141 mmol/L (ref 134–144)
eGFR: 52 mL/min/{1.73_m2} — ABNORMAL LOW (ref >59)

## 2021-03-26 ENCOUNTER — Telehealth: Payer: Self-pay | Admitting: Cardiology

## 2021-03-26 MED ORDER — FUROSEMIDE 20 MG PO TABS: ORAL_TABLET | ORAL | 3 refills | Status: DC

## 2021-03-26 NOTE — Telephone Encounter (Signed)
Called pt notified him of pharmacist reply to dizziness associated with starting [place].  I informed him to call back if dizziness persist.  He thanked me for following up.

## 2021-03-26 NOTE — Telephone Encounter (Signed)
Patient returning call for lab results. 

## 2021-03-26 NOTE — Telephone Encounter (Signed)
The dizziness could be from the mild dehydration (since [place] does cause some diuresis) which will improve/ go away with the lower dose of furosemide.

## 2021-03-26 NOTE — Telephone Encounter (Signed)
-----   Message from Nuala Alpha, LPN sent at 03/01/7275  7:25 AM EDT -----  ----- Message ----- From: Freada Bergeron, MD Sent: 03/25/2021   9:03 PM EDT To: Nuala Alpha, LPN  His labs look stable from last check. Given the slight rise in his Cr compared to 5 months ago, let's decrease his lasix to 40mg  daily and only take an additional 20mg  as needed for swelling. His farxiga is also helping keep the fluid off so he may just not need as much diuretic now.

## 2021-03-26 NOTE — Telephone Encounter (Signed)
Agree with patient's plan. Please follow MD instructions to decrease furosemide, resume farxiga tomorrow morning, and stay hydrated.

## 2021-03-26 NOTE — Telephone Encounter (Signed)
Reviewed results and MD recommendations.  Pt verbalizes understanding.  He reports that he has not taken [place] since Sat or Sun May 28 or 29.  He reports that about 3 days after starting medication he began to feel really dizzy.  Reports BP run 110-120/ 60's.  He reports he will restart Wilder Glade to see if dizziness returns.  Will send to pharmacist to review.

## 2021-03-27 ENCOUNTER — Other Ambulatory Visit (HOSPITAL_COMMUNITY): Payer: Self-pay | Admitting: Physician Assistant

## 2021-03-29 ENCOUNTER — Ambulatory Visit (INDEPENDENT_AMBULATORY_CARE_PROVIDER_SITE_OTHER): Payer: Medicare HMO

## 2021-03-29 DIAGNOSIS — Z95810 Presence of automatic (implantable) cardiac defibrillator: Secondary | ICD-10-CM

## 2021-03-29 DIAGNOSIS — I5042 Chronic combined systolic (congestive) and diastolic (congestive) heart failure: Secondary | ICD-10-CM

## 2021-03-29 NOTE — Telephone Encounter (Signed)
Prescription refill request for Eliquis received. Indication: AFIB  Last office visit: [name] 03/09/2021 Scr: 1.39, 03/25/2021 Age: 80 yo  Weight: 119.9 kg   Pt is on the correct dose of Eliquis per dosing criteria, prescription refill sent for Eliquis 5mg  BID.

## 2021-03-30 NOTE — Progress Notes (Signed)
Received: Today Freada Bergeron, MD  Maniya Donovan Panda, RN; [name], MD This is great! Thank you!!

## 2021-03-30 NOTE — Progress Notes (Signed)
EPIC Encounter for ICM Monitoring  Patient Name: [name] is a 80 y.o. male Date: 03/30/2021 Primary Care Physican: Orpah Melter, MD Primary Cardiologist:Pemberton Electrophysiologist:Allred Bi-V Pacing:83% 03/30/2021 Weight: 269 lbs  AT/AF Burden:6.8% (taking Eliquis)  Spoke with patient and reports swelling in fingers because ring is tight and weight gain of 2-3 lbs. Heart failure questions reviewed. Lowest weight in last week was 266 lbs.  He has been drinking >64 oz fluid daily and not monitoring salt intake.  Corvue thoracic impedancesuggesting possible fluid accumulation starting 03/19/2021.   Prescribed:   Furosemide20 mg Take 2 tablets (40 mg total) by mouth daily in the AM. Take 1 tablet (20 mg) as needed in the PM for swelling. (dosage decreased on 03/10/2021)  Spironolactone 25 mg take 0.5 tablet once a day.  Farxiga 10 mg take 1 tablet daily before breakfast  Labs: 03/25/2021 Creatinine 1.39, BUN 24, Potassium 4.4, Sodium 141, GFR 52 03/09/2021 Creatinine 1.35, BUN 35, Potassium 4.3, Sodium 140, GFR 53  Acomplete set of results can be found in Results Review.  Recommendations:Advised to take 1 extra 20 mg Furosemide tablet x 2 evenings in addition to the 40 mg every morning.  Advised to limit salt and fluid intake.  Follow-up plan: ICM clinic phone appointment on6/07/2021 (manual) to recheck fluid levels. 91 day device clinic remote transmission 05/18/2021.   EP/Cardiology Office Visits:06/02/2021 with Dr.Pemberton.  Copy of ICM check sent to Dr.Allred and Dr Johney Frame as Juluis Rainier.  3 month ICM trend: 03/30/2021.    1 Year ICM trend:       [name], RN 03/30/2021 10:28 AM

## 2021-03-31 DIAGNOSIS — L578 Other skin changes due to chronic exposure to nonionizing radiation: Secondary | ICD-10-CM | POA: Diagnosis not present

## 2021-03-31 DIAGNOSIS — D1801 Hemangioma of skin and subcutaneous tissue: Secondary | ICD-10-CM | POA: Diagnosis not present

## 2021-03-31 DIAGNOSIS — Z85828 Personal history of other malignant neoplasm of skin: Secondary | ICD-10-CM | POA: Diagnosis not present

## 2021-03-31 DIAGNOSIS — L570 Actinic keratosis: Secondary | ICD-10-CM | POA: Diagnosis not present

## 2021-03-31 DIAGNOSIS — W908XXS Exposure to other nonionizing radiation, sequela: Secondary | ICD-10-CM | POA: Diagnosis not present

## 2021-04-02 NOTE — Progress Notes (Signed)
No ICM remote transmission received for 04/02/2021 and next ICM transmission scheduled for 04/12/2021.

## 2021-04-12 ENCOUNTER — Ambulatory Visit (INDEPENDENT_AMBULATORY_CARE_PROVIDER_SITE_OTHER): Payer: Medicare HMO

## 2021-04-12 DIAGNOSIS — Z95810 Presence of automatic (implantable) cardiac defibrillator: Secondary | ICD-10-CM

## 2021-04-12 DIAGNOSIS — I5042 Chronic combined systolic (congestive) and diastolic (congestive) heart failure: Secondary | ICD-10-CM

## 2021-04-15 ENCOUNTER — Telehealth: Payer: Self-pay

## 2021-04-15 NOTE — Telephone Encounter (Signed)
LMOVM for patient to send missed ICM transmission. 

## 2021-04-16 NOTE — Progress Notes (Signed)
EPIC Encounter for ICM Monitoring  Patient Name: [name] is a 80 y.o. male Date: 04/16/2021 Primary Care Physican: Orpah Melter, MD Primary Cardiologist: Johney Frame Electrophysiologist: Allred Bi-V Pacing: 82%            04/16/2021 Weight: 255 lbs   AT/AF Burden: 6.3% (taking Eliquis)          Spoke with patient and reports feeling well at this time. Fluid symptoms resolved.    Corvue thoracic impedance suggesting fluid levels returned to normal after taking extra Furosemide.   Prescribed: Furosemide 20 mg Take 2 tablets (40 mg total) by mouth daily in the AM.  Take 1 tablet (20 mg) as needed in the PM for swelling. (dosage decreased on 03/10/2021) Spironolactone 25 mg take 0.5 tablet once a day. Farxiga 10 mg take 1 tablet daily before breakfast   Labs: 03/25/2021 Creatinine 1.39, BUN 24, Potassium 4.4, Sodium 141, GFR 52 03/09/2021 Creatinine 1.35, BUN 35, Potassium 4.3, Sodium 140, GFR 53  A complete set of results can be found in Results Review.   Recommendations: No changes and encouraged to call if experiencing any fluid symptoms.   Follow-up plan: ICM clinic phone appointment on 05/04/2021.   91 day device clinic remote transmission 05/18/2021.     EP/Cardiology Office Visits: 06/02/2021 with Dr. Johney Frame.     Copy of ICM check sent to Dr. Rayann Heman   3 month ICM trend: 04/15/2021.    1 Year ICM trend:       [name], RN 04/16/2021 8:44 AM

## 2021-05-02 IMAGING — DX DG CHEST 2V
2 series · 2 of 2 positions shown · non-contrast
Comparison: 08/05/2015

CLINICAL DATA: Short of breath, coronary artery disease

EXAM:
CHEST - 2 VIEW

[dg chest 2 view (1 of 2)]
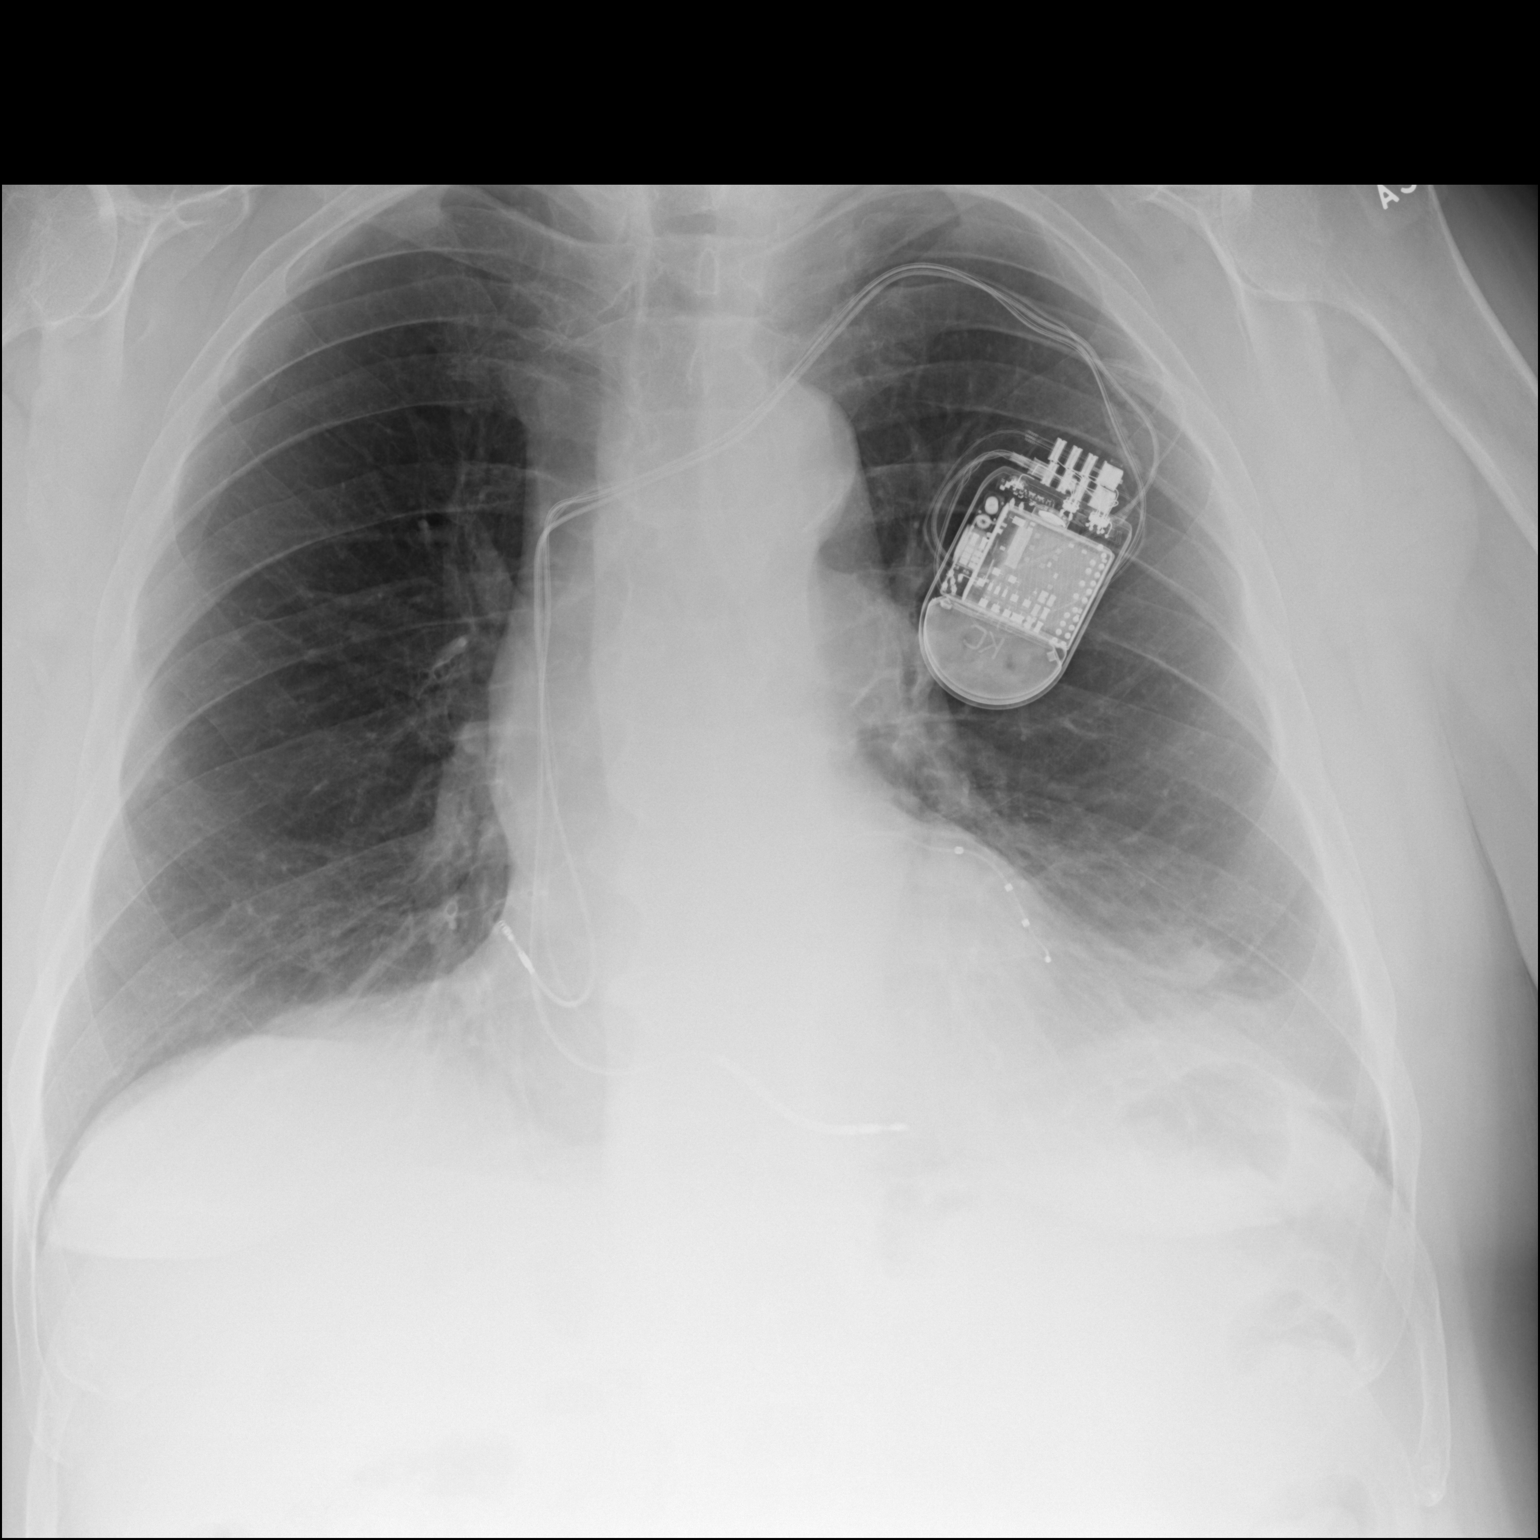

[dg chest 2 view (2 of 2)]
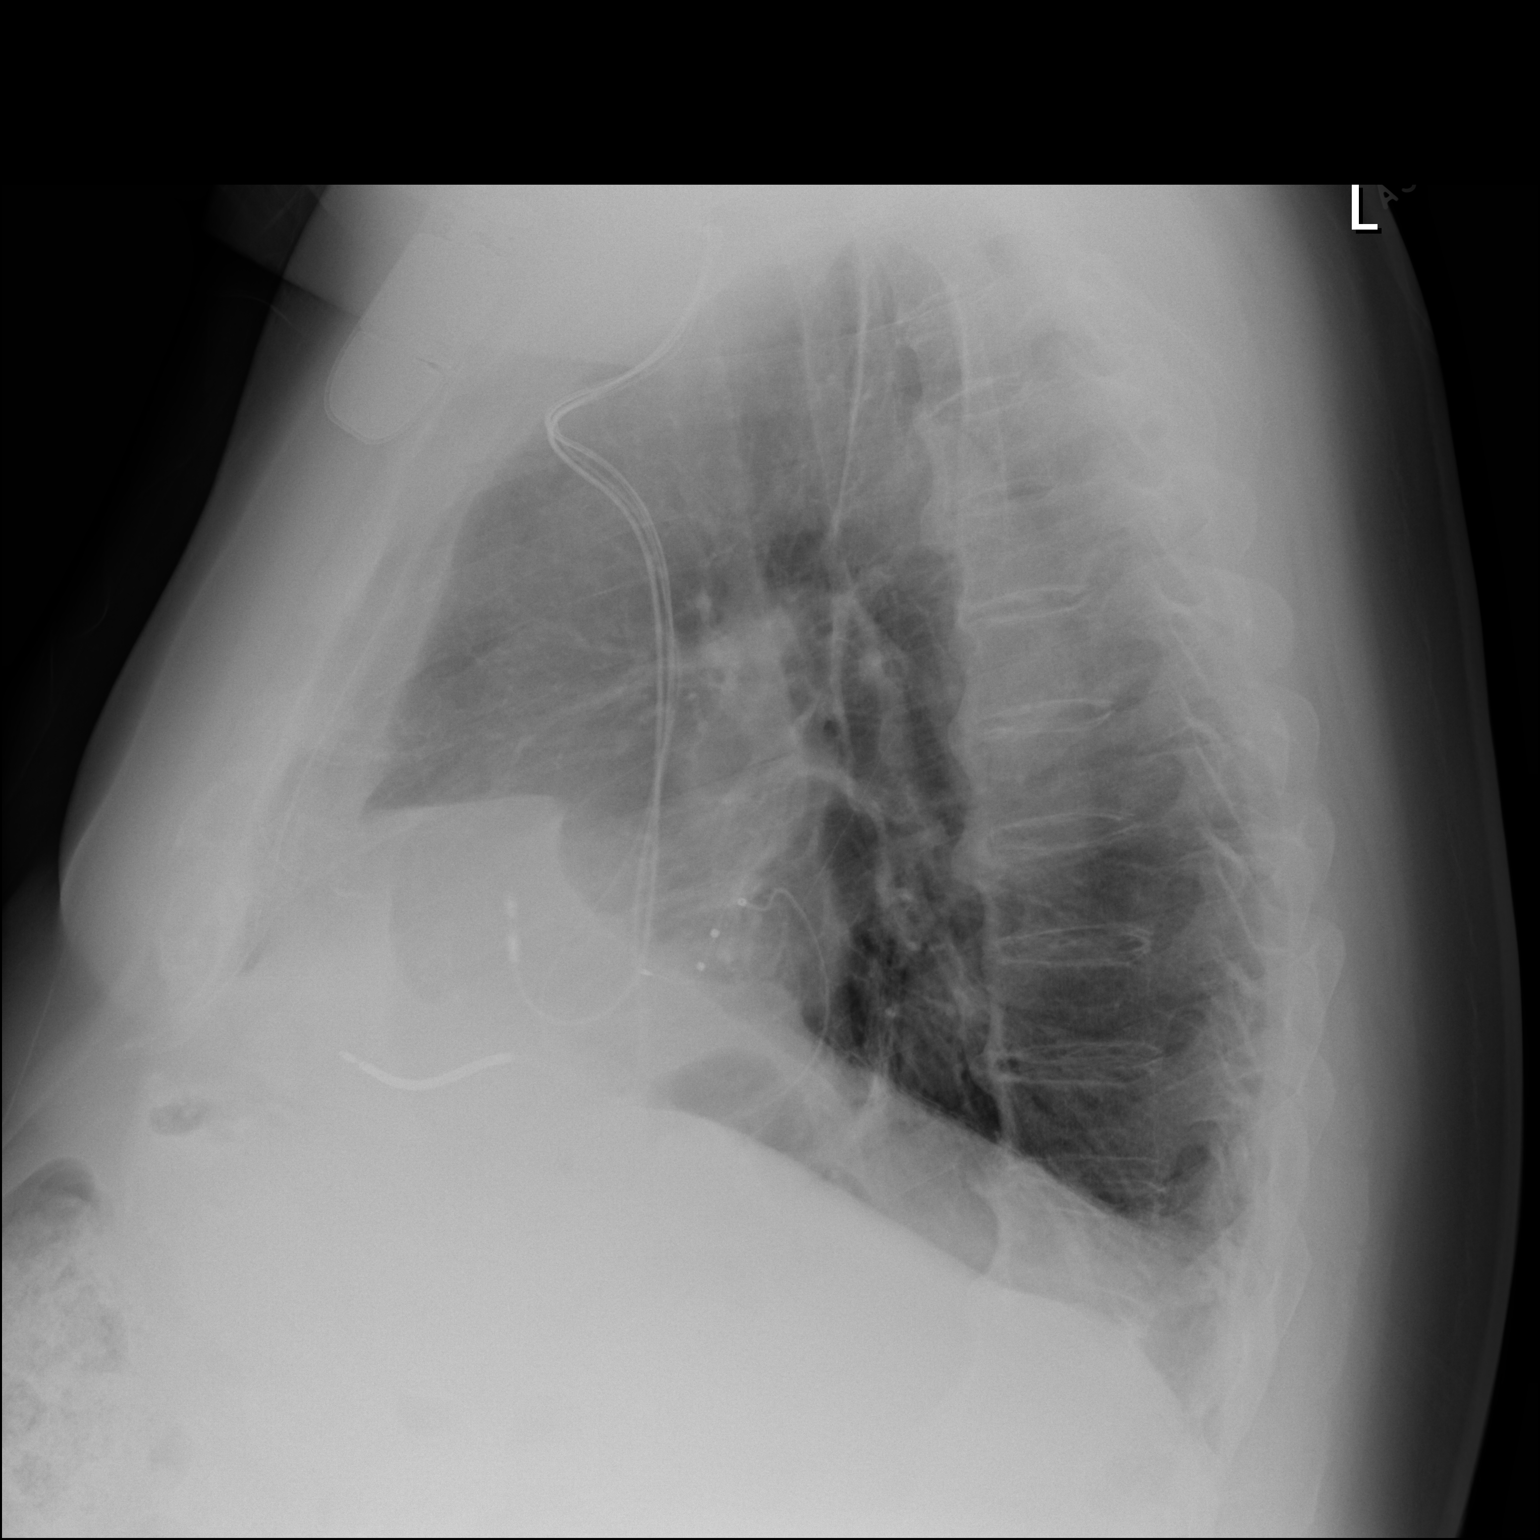

[2 of 2 positions shown; findings below may reference images not displayed]

FINDINGS: Frontal and lateral views of the chest demonstrates stable multi
lead pacemaker. Cardiac silhouette is stable. No airspace disease,
effusion, or pneumothorax. Stable scarring at the left lung base. No
acute bony abnormalities.
IMPRESSION: 1. Stable chest, no acute process.

## 2021-05-04 ENCOUNTER — Ambulatory Visit (INDEPENDENT_AMBULATORY_CARE_PROVIDER_SITE_OTHER): Payer: Medicare HMO

## 2021-05-04 DIAGNOSIS — Z95810 Presence of automatic (implantable) cardiac defibrillator: Secondary | ICD-10-CM | POA: Diagnosis not present

## 2021-05-04 DIAGNOSIS — I5042 Chronic combined systolic (congestive) and diastolic (congestive) heart failure: Secondary | ICD-10-CM

## 2021-05-06 ENCOUNTER — Telehealth: Payer: Self-pay

## 2021-05-06 NOTE — Telephone Encounter (Signed)
I spoke with the patient and he agreed to send missed transmission later tonight or first thing in the morning.

## 2021-05-07 ENCOUNTER — Telehealth: Payer: Self-pay

## 2021-05-07 NOTE — Telephone Encounter (Signed)
Remote ICM transmission received.  Attempted call to patient regarding ICM remote transmission and left detailed message per DPR.  Advised to return call for any fluid symptoms or questions. Next ICM remote transmission scheduled 06/07/2021.

## 2021-05-07 NOTE — Progress Notes (Signed)
EPIC Encounter for ICM Monitoring  Patient Name: [name] is a 80 y.o. male Date: 05/07/2021 Primary Care Physican: Orpah Melter, MD Primary Cardiologist: Johney Frame Electrophysiologist: Allred Bi-V Pacing: 82%            04/16/2021 Weight: 255 lbs   AT/AF Burden: 5.8% (taking Eliquis)          Attempted call to patient and unable to reach.  Left detailed message per DPR regarding transmission. Transmission reviewed.    Corvue thoracic impedance normal but was suggesting possible fluid accumulation from 7/2-7/8.   Prescribed: Furosemide 20 mg Take 2 tablets (40 mg total) by mouth daily in the AM.  Take 1 tablet (20 mg) as needed in the PM for swelling. (dosage decreased on 03/10/2021) Spironolactone 25 mg take 0.5 tablet once a day. Farxiga 10 mg take 1 tablet daily before breakfast   Labs: 03/25/2021 Creatinine 1.39, BUN 24, Potassium 4.4, Sodium 141, GFR 52 03/09/2021 Creatinine 1.35, BUN 35, Potassium 4.3, Sodium 140, GFR 53  A complete set of results can be found in Results Review.   Recommendations: Left voice mail with ICM number and encouraged to call if experiencing any fluid symptoms.   Follow-up plan: ICM clinic phone appointment on 06/07/2021.   91 day device clinic remote transmission 05/18/2021.     EP/Cardiology Office Visits: 06/02/2021 with Dr. Johney Frame.     Copy of ICM check sent to Dr. Rayann Heman.   3 month ICM trend: 05/07/2021.    1 Year ICM trend:       [name], RN 05/07/2021 8:07 AM

## 2021-05-11 DIAGNOSIS — E039 Hypothyroidism, unspecified: Secondary | ICD-10-CM | POA: Diagnosis not present

## 2021-05-11 DIAGNOSIS — E261 Secondary hyperaldosteronism: Secondary | ICD-10-CM | POA: Diagnosis not present

## 2021-05-11 DIAGNOSIS — E1159 Type 2 diabetes mellitus with other circulatory complications: Secondary | ICD-10-CM | POA: Diagnosis not present

## 2021-05-11 DIAGNOSIS — E785 Hyperlipidemia, unspecified: Secondary | ICD-10-CM | POA: Diagnosis not present

## 2021-05-11 DIAGNOSIS — I4891 Unspecified atrial fibrillation: Secondary | ICD-10-CM | POA: Diagnosis not present

## 2021-05-11 DIAGNOSIS — D6869 Other thrombophilia: Secondary | ICD-10-CM | POA: Diagnosis not present

## 2021-05-11 DIAGNOSIS — E11620 Type 2 diabetes mellitus with diabetic dermatitis: Secondary | ICD-10-CM | POA: Diagnosis not present

## 2021-05-11 DIAGNOSIS — I110 Hypertensive heart disease with heart failure: Secondary | ICD-10-CM | POA: Diagnosis not present

## 2021-05-11 DIAGNOSIS — I509 Heart failure, unspecified: Secondary | ICD-10-CM | POA: Diagnosis not present

## 2021-05-17 ENCOUNTER — Encounter: Payer: Self-pay | Admitting: Cardiology

## 2021-05-18 ENCOUNTER — Ambulatory Visit (INDEPENDENT_AMBULATORY_CARE_PROVIDER_SITE_OTHER): Payer: Medicare HMO

## 2021-05-18 ENCOUNTER — Ambulatory Visit: Payer: Medicare HMO | Attending: Internal Medicine

## 2021-05-18 DIAGNOSIS — Z23 Encounter for immunization: Secondary | ICD-10-CM

## 2021-05-18 DIAGNOSIS — I255 Ischemic cardiomyopathy: Secondary | ICD-10-CM

## 2021-05-18 LAB — CUP PACEART REMOTE DEVICE CHECK
Battery Remaining Longevity: 23 mo
Battery Remaining Percentage: 27 %
Battery Voltage: 2.89 V
Brady Statistic AP VP Percent: 8.9 %
Brady Statistic AP VS Percent: 1 %
Brady Statistic AS VP Percent: 75 %
Brady Statistic AS VS Percent: 7.7 %
Brady Statistic RA Percent Paced: 2.9 %
Date Time Interrogation Session: 20220726072848
HighPow Impedance: 69 Ohm
HighPow Impedance: 69 Ohm
Implantable Lead Implant Date: 20161011
Implantable Lead Implant Date: 20161011
Implantable Lead Implant Date: 20161011
Implantable Lead Location: 753858
Implantable Lead Location: 753859
Implantable Lead Location: 753860
Implantable Pulse Generator Implant Date: 20161011
Lead Channel Impedance Value: 380 Ohm
Lead Channel Impedance Value: 410 Ohm
Lead Channel Impedance Value: 630 Ohm
Lead Channel Pacing Threshold Amplitude: 0.5 V
Lead Channel Pacing Threshold Amplitude: 0.75 V
Lead Channel Pacing Threshold Amplitude: 0.875 V
Lead Channel Pacing Threshold Pulse Width: 0.5 ms
Lead Channel Pacing Threshold Pulse Width: 0.5 ms
Lead Channel Pacing Threshold Pulse Width: 0.8 ms
Lead Channel Sensing Intrinsic Amplitude: 3.1 mV
Lead Channel Sensing Intrinsic Amplitude: 8.7 mV
Lead Channel Setting Pacing Amplitude: 2 V
Lead Channel Setting Pacing Amplitude: 2 V
Lead Channel Setting Pacing Amplitude: 2 V
Lead Channel Setting Pacing Pulse Width: 0.5 ms
Lead Channel Setting Pacing Pulse Width: 0.8 ms
Lead Channel Setting Sensing Sensitivity: 0.5 mV
Pulse Gen Serial Number: 7276664

## 2021-05-18 NOTE — Progress Notes (Signed)
   Covid-19 Vaccination Clinic  Name:  CAMIREN DORWARD    MRN: [id number] DOB: [birth year]  05/18/2021  Mr. Lashomb was observed post Covid-19 immunization for 15 minutes without incident. He was provided with Vaccine Information Sheet and instruction to access the V-Safe system.   Mr. Gurry was instructed to call 911 with any severe reactions post vaccine: Difficulty breathing  Swelling of face and throat  A fast heartbeat  A bad rash all over body  Dizziness and weakness   Immunizations Administered     Name Date Dose VIS Date Route   PFIZER Comrnaty(Gray TOP) Covid-19 Vaccine 05/18/2021  1:42 PM 0.3 mL 10/01/2020 Intramuscular   Manufacturer: [place]   Lot: [id number]   [place]: [phone]

## 2021-05-21 ENCOUNTER — Other Ambulatory Visit (HOSPITAL_BASED_OUTPATIENT_CLINIC_OR_DEPARTMENT_OTHER): Payer: Self-pay

## 2021-05-21 MED ORDER — COVID-19 MRNA VAC-TRIS(PFIZER) 30 MCG/0.3ML IM SUSP
INTRAMUSCULAR | 0 refills | Status: DC
  Filled 2021-05-21: qty 0.3, 1d supply, fill #0

## 2021-06-02 ENCOUNTER — Ambulatory Visit: Payer: Medicare HMO | Admitting: Cardiology

## 2021-06-02 ENCOUNTER — Other Ambulatory Visit: Payer: Medicare HMO

## 2021-06-03 ENCOUNTER — Other Ambulatory Visit: Payer: Self-pay

## 2021-06-03 ENCOUNTER — Other Ambulatory Visit: Payer: Medicare HMO | Admitting: *Deleted

## 2021-06-03 DIAGNOSIS — I2510 Atherosclerotic heart disease of native coronary artery without angina pectoris: Secondary | ICD-10-CM

## 2021-06-03 DIAGNOSIS — I5042 Chronic combined systolic (congestive) and diastolic (congestive) heart failure: Secondary | ICD-10-CM | POA: Diagnosis not present

## 2021-06-03 DIAGNOSIS — E785 Hyperlipidemia, unspecified: Secondary | ICD-10-CM

## 2021-06-03 DIAGNOSIS — I5022 Chronic systolic (congestive) heart failure: Secondary | ICD-10-CM | POA: Diagnosis not present

## 2021-06-03 DIAGNOSIS — E7849 Other hyperlipidemia: Secondary | ICD-10-CM

## 2021-06-03 DIAGNOSIS — Z95810 Presence of automatic (implantable) cardiac defibrillator: Secondary | ICD-10-CM

## 2021-06-03 DIAGNOSIS — Z79899 Other long term (current) drug therapy: Secondary | ICD-10-CM

## 2021-06-03 LAB — LIPID PANEL
Chol/HDL Ratio: 2.5 ratio (ref 0.0–5.0)
Cholesterol, Total: 97 mg/dL — ABNORMAL LOW (ref 100–199)
HDL: 39 mg/dL — ABNORMAL LOW (ref >39)
LDL Chol Calc (NIH): 46 mg/dL (ref 0–99)
Triglycerides: 49 mg/dL (ref 0–149)
VLDL Cholesterol Cal: 12 mg/dL (ref 5–40)

## 2021-06-07 ENCOUNTER — Ambulatory Visit (INDEPENDENT_AMBULATORY_CARE_PROVIDER_SITE_OTHER): Payer: Medicare HMO

## 2021-06-07 DIAGNOSIS — Z95810 Presence of automatic (implantable) cardiac defibrillator: Secondary | ICD-10-CM | POA: Diagnosis not present

## 2021-06-07 DIAGNOSIS — I5042 Chronic combined systolic (congestive) and diastolic (congestive) heart failure: Secondary | ICD-10-CM | POA: Diagnosis not present

## 2021-06-10 ENCOUNTER — Telehealth: Payer: Self-pay

## 2021-06-10 NOTE — Telephone Encounter (Signed)
I spoke with the patient about missed HF transmission. Transmission received.

## 2021-06-11 NOTE — Progress Notes (Signed)
EPIC Encounter for ICM Monitoring  Patient Name: [name] is a 80 y.o. male Date: 06/11/2021 Primary Care Physican: Orpah Melter, MD Primary Cardiologist: Johney Frame Electrophysiologist: Allred Bi-V Pacing: 81%            04/16/2021 Weight: 255 lbs   AT/AF Burden: 5.6% (taking Eliquis)          Spoke with patient and heart failure questions reviewed.  Pt asymptomatic for fluid accumulation and feeling well.  He did not take Furosemide for a couple of days and ate foods high in salt which may contribute to decreased impedance.   Pt reporting difficulty with paying for Eliquis, Entresto and Farxiga.  Message sent to [facility] pharmacist for assistance if possible.   Corvue thoracic impedance suggesting possible fluid accumulation starting 06/06/2021.   Prescribed: Furosemide 20 mg Take 2 tablets (40 mg total) by mouth daily in the AM.  Take 1 tablet (20 mg) as needed in the PM for swelling. (dosage decreased on 03/10/2021) Spironolactone 25 mg take 0.5 tablet once a day. Farxiga 10 mg take 1 tablet daily before breakfast   Labs: 03/25/2021 Creatinine 1.39, BUN 24, Potassium 4.4, Sodium 141, GFR 52 03/09/2021 Creatinine 1.35, BUN 35, Potassium 4.3, Sodium 140, GFR 53  A complete set of results can be found in Results Review.   Recommendations: Advised to take 1 tablet 20 mg Furosemide in the evening x 2 days and then return back to prescribed dosage.    Follow-up plan: ICM clinic phone appointment on 06/14/2021 (manual) to recheck fluid levels.   91 day device clinic remote transmission 08/17/2021.     EP/Cardiology Office Visits: 06/02/2021 with Dr. Johney Frame was canceled by provider, needs rescheduling.     Copy of ICM check sent to Dr. Rayann Heman and Dr Johney Frame as Juluis Rainier.    3 month ICM trend: 06/10/2021.    1 Year ICM trend:       Marcus Billings, RN 06/11/2021 8:02 AM

## 2021-06-14 ENCOUNTER — Ambulatory Visit (INDEPENDENT_AMBULATORY_CARE_PROVIDER_SITE_OTHER): Payer: Medicare HMO

## 2021-06-14 ENCOUNTER — Telehealth: Payer: Self-pay

## 2021-06-14 DIAGNOSIS — I5042 Chronic combined systolic (congestive) and diastolic (congestive) heart failure: Secondary | ICD-10-CM

## 2021-06-14 DIAGNOSIS — Z95810 Presence of automatic (implantable) cardiac defibrillator: Secondary | ICD-10-CM

## 2021-06-14 NOTE — Telephone Encounter (Signed)
**Note De-Identified Laquandra Carrillo Obfuscation** The pt states that Nemiah Commander is making him dizzy and wants to know if he can take something else in its place. He is aware that someone will be calling him back to discuss.  Also, the pt and I discussed hm applying for pt asst through [employer] for his Eliquis and [employer] Pt Asst [employer] for his Delene Loll and he is interested in applying as he states he cannot afford the monthly cost of each and is aware that if approved he will receive his Eliquis free of charge from [employer] and his Delene Loll free of charge from [employer] pt [employer] for the remainder of this year.  He is aware to ask each Pt Asst Foundation questions about their programs and his eligibility to be approved for their program and that if it appears that he is eligible to ask each foundation to  mail him a pt asst application to his home address. I advised him that once he receives his applications to complete the pt part of each applications, obtain required documents per each foundation, and to bring all to Dr [employer] office at [employer] at [employer]	[place] to drop off in the front office and that we will take care of the provide page of each applications and will fax all to the appropriate foundation.

## 2021-06-14 NOTE — Progress Notes (Signed)
Remote ICD transmission.   

## 2021-06-14 NOTE — Telephone Encounter (Signed)
**Note De-identified Marc Sivertsen Obfuscation** -----  **Note De-Identified Hillman Attig Obfuscation** Message from Leeroy Bock, [place] sent at 06/14/2021  7:38 AM EDT ----- Regarding: RE: Assistance with meds I'm adding Jeani Hawking to this message to see if she can help with pt assistance options.  Thanks, Jinny Blossom ----- Message ----- From: [name], RN Sent: 06/11/2021   1:21 PM EDT To: Leeroy Bock, RPH-CPP Subject: Assistance with meds                           Hello,  I am the device nurse working with this patient.  Pt reports difficulty paying for Eliquis, Entresto and Farxiga.  Is this something you can asisst with or should I contact Dr Jacolyn Reedy nurse for that?  Thanks in advance, Sharman Cheek RN Device clinic

## 2021-06-15 NOTE — Telephone Encounter (Signed)
I spoke with pt and he did not have any recent BP readings.  Last checked about 2 weeks ago and doesn't recall the reading.  Currently taking Furosemide '40mg'$  QAM, '20mg'$  QPM.  Pt will come for lab work tomorrow.  Advised pt to check BP today and tomorrow and when we call with lab results, give [place] those readings.  Advised we need these to help make proper adjustments in his medications.  Pt agreeable to plan.

## 2021-06-15 NOTE — Telephone Encounter (Signed)
[name]- can you please see how much furosemide patient is taking? Also see if he is checking his blood pressure at home and what it is. I would like him to come in for BMP to make sure he is not dehydrated. He may need a further reduction in furosemide.

## 2021-06-16 ENCOUNTER — Other Ambulatory Visit: Payer: Medicare HMO | Admitting: *Deleted

## 2021-06-16 ENCOUNTER — Other Ambulatory Visit: Payer: Self-pay

## 2021-06-16 DIAGNOSIS — I5042 Chronic combined systolic (congestive) and diastolic (congestive) heart failure: Secondary | ICD-10-CM

## 2021-06-16 LAB — BASIC METABOLIC PANEL
BUN/Creatinine Ratio: 19 (ref 10–24)
BUN: 24 mg/dL (ref 8–27)
CO2: 22 mmol/L (ref 20–29)
Calcium: 9.1 mg/dL (ref 8.6–10.2)
Chloride: 102 mmol/L (ref 96–106)
Creatinine, Ser: 1.26 mg/dL (ref 0.76–1.27)
Glucose: 72 mg/dL (ref 65–99)
Potassium: 3.9 mmol/L (ref 3.5–5.2)
Sodium: 140 mmol/L (ref 134–144)
eGFR: 58 mL/min/{1.73_m2} — ABNORMAL LOW (ref >59)

## 2021-06-16 NOTE — Telephone Encounter (Signed)
Pt c/o BP issue: STAT if pt c/o blurred vision, one-sided weakness or slurred speech  1. What are your last 5 BP readings?  06/15/21 108/64 states it varied never got the same reading twice. Only gave one reading  2. Are you having any other symptoms (ex. Dizziness, headache, blurred vision, passed out)? No   3. What is your BP issue?  Was advised to call and report readings.

## 2021-06-16 NOTE — Telephone Encounter (Signed)
Spoke with pt who states he only recorded one BP of 108/64 but has taken it 4 or 5 times with variations in BP. Provided education on fluctuations in BP and taking BP daily about 2 hours after morning medications.  Pt denies current dizziness, CP or SOB.  He states he came in today for labs as requested.   Pt advised will forward BP reading will await lab report.  Continue medications as prescribed for now.  Will contact once lab has been resulted.  Pt verbalizes understanding and agrees with current plan.

## 2021-06-17 ENCOUNTER — Telehealth: Payer: Self-pay

## 2021-06-17 NOTE — Telephone Encounter (Signed)
Called pt to discuss labs and his symptoms. LVM for pt to return call. His labs looks good- does not look dehydrated. Renal function is improved. Will need to get a better idea of his symptoms and home blood pressures.

## 2021-06-17 NOTE — Telephone Encounter (Signed)
LMOVM for patient to send missed HF transmission.

## 2021-06-18 ENCOUNTER — Telehealth: Payer: Self-pay

## 2021-06-18 NOTE — Telephone Encounter (Signed)
Remote ICM transmission received.  Attempted call to patient regarding ICM remote transmission and left detailed message per DPR.  Advised to return call for any fluid symptoms or questions. Next ICM remote transmission scheduled 07/26/2021.

## 2021-06-18 NOTE — Progress Notes (Signed)
EPIC Encounter for ICM Monitoring  Patient Name: [name] is a 80 y.o. male Date: 06/18/2021 Primary Care Physican: Orpah Melter, MD Primary Cardiologist: Johney Frame Electrophysiologist: Allred Bi-V Pacing: 81%            04/16/2021 Weight: 255 lbs   AT/AF Burden: 5.7% (taking Eliquis)          Spoke with patient and heart failure questions reviewed.  Pt asymptomatic for fluid accumulation and feeling well.   Corvue thoracic impedance suggesting fluid levels returned to normal after recommendation to take 20 mg PRN Furosemide x 2 evenings.   Prescribed: Furosemide 20 mg Take 2 tablets (40 mg total) by mouth daily in the AM.  Take 1 tablet (20 mg) as needed in the PM for swelling. (dosage decreased on 03/10/2021) Spironolactone 25 mg take 0.5 tablet once a day. Farxiga 10 mg take 1 tablet daily before breakfast   Labs: 06/16/2021 Creatinine 1.26, BUN 24, Potassium 3.9, Sodium 140, GFR 58 03/25/2021 Creatinine 1.39, BUN 24, Potassium 4.4, Sodium 141, GFR 52 03/09/2021 Creatinine 1.35, BUN 35, Potassium 4.3, Sodium 140, GFR 53  A complete set of results can be found in Results Review.   Recommendations:  No changes and encouraged to call if experiencing any fluid symptoms.   Follow-up plan: ICM clinic phone appointment on 07/26/2021.   91 day device clinic remote transmission 08/17/2021.     EP/Cardiology Office Visits: 06/02/2021 with Dr. Johney Frame was canceled by provider, needs rescheduling.     Copy of ICM check sent to Dr. Rayann Heman.    3 month ICM trend: 06/17/2021.    1 Year ICM trend:       [name], RN 06/18/2021 12:09 PM

## 2021-06-18 NOTE — Telephone Encounter (Signed)
I spoke with patient. She states that he was feeling dizzy in the AM after taking [place]. He stopped for a few days and felt better. He then resumed and the same thing happened so he stopped again. He resumed taking and increased his water in take a litte. He also stopped taking evening dose of furosemide since he read that Wilder Glade can be like a diuretic. Currently he is having no issues. Advised to continue Farxgia and continue furosemide '40mg'$  in AM. Can exclude the '20mg'$  in the evening. Lab work stable.

## 2021-06-22 DIAGNOSIS — G4733 Obstructive sleep apnea (adult) (pediatric): Secondary | ICD-10-CM | POA: Diagnosis not present

## 2021-07-05 ENCOUNTER — Other Ambulatory Visit (HOSPITAL_BASED_OUTPATIENT_CLINIC_OR_DEPARTMENT_OTHER): Payer: Self-pay

## 2021-07-21 DIAGNOSIS — Z23 Encounter for immunization: Secondary | ICD-10-CM | POA: Diagnosis not present

## 2021-07-30 NOTE — Progress Notes (Signed)
No ICM remote transmission received for 07/26/2021 and next ICM transmission scheduled for 08/23/2021.

## 2021-08-17 ENCOUNTER — Ambulatory Visit (INDEPENDENT_AMBULATORY_CARE_PROVIDER_SITE_OTHER): Payer: Medicare HMO

## 2021-08-17 DIAGNOSIS — I255 Ischemic cardiomyopathy: Secondary | ICD-10-CM

## 2021-08-18 LAB — CUP PACEART REMOTE DEVICE CHECK
Battery Remaining Longevity: 23 mo
Battery Remaining Percentage: 28 %
Battery Voltage: 2.86 V
Brady Statistic AP VP Percent: 9.4 %
Brady Statistic AP VS Percent: 1 %
Brady Statistic AS VP Percent: 74 %
Brady Statistic AS VS Percent: 8.9 %
Brady Statistic RA Percent Paced: 3.1 %
Date Time Interrogation Session: 20221026105316
HighPow Impedance: 71 Ohm
HighPow Impedance: 71 Ohm
Implantable Lead Implant Date: 20161011
Implantable Lead Implant Date: 20161011
Implantable Lead Implant Date: 20161011
Implantable Lead Location: 753858
Implantable Lead Location: 753859
Implantable Lead Location: 753860
Implantable Pulse Generator Implant Date: 20161011
Lead Channel Impedance Value: 350 Ohm
Lead Channel Impedance Value: 390 Ohm
Lead Channel Impedance Value: 640 Ohm
Lead Channel Pacing Threshold Amplitude: 0.5 V
Lead Channel Pacing Threshold Amplitude: 0.75 V
Lead Channel Pacing Threshold Amplitude: 1 V
Lead Channel Pacing Threshold Pulse Width: 0.5 ms
Lead Channel Pacing Threshold Pulse Width: 0.5 ms
Lead Channel Pacing Threshold Pulse Width: 0.8 ms
Lead Channel Sensing Intrinsic Amplitude: 2.6 mV
Lead Channel Sensing Intrinsic Amplitude: 7.3 mV
Lead Channel Setting Pacing Amplitude: 2 V
Lead Channel Setting Pacing Amplitude: 2 V
Lead Channel Setting Pacing Amplitude: 2 V
Lead Channel Setting Pacing Pulse Width: 0.5 ms
Lead Channel Setting Pacing Pulse Width: 0.8 ms
Lead Channel Setting Sensing Sensitivity: 0.5 mV
Pulse Gen Serial Number: 7276664

## 2021-08-23 ENCOUNTER — Ambulatory Visit (INDEPENDENT_AMBULATORY_CARE_PROVIDER_SITE_OTHER): Payer: Medicare HMO

## 2021-08-23 DIAGNOSIS — Z95810 Presence of automatic (implantable) cardiac defibrillator: Secondary | ICD-10-CM

## 2021-08-23 DIAGNOSIS — I5042 Chronic combined systolic (congestive) and diastolic (congestive) heart failure: Secondary | ICD-10-CM | POA: Diagnosis not present

## 2021-08-24 ENCOUNTER — Telehealth: Payer: Self-pay

## 2021-08-24 NOTE — Progress Notes (Signed)
EPIC Encounter for ICM Monitoring  Patient Name: [name] is a 80 y.o. male Date: 08/24/2021 Primary Care Physican: Orpah Melter, MD Primary Cardiologist: Johney Frame Electrophysiologist: Allred Bi-V Pacing: 80%            04/16/2021 Weight: 255 lbs   AT/AF Burden: 5.3% (taking Eliquis)          Attempted call to patient and unable to reach.  Left detailed message per DPR regarding transmission. Transmission reviewed.    Corvue thoracic impedance suggesting normal fluid levels.   Prescribed: Furosemide 20 mg Take 2 tablets (40 mg total) by mouth daily in the AM.  Take 1 tablet (20 mg) as needed in the PM for swelling. (dosage decreased on 03/10/2021) Spironolactone 25 mg take 0.5 tablet once a day. Farxiga 10 mg take 1 tablet daily before breakfast   Labs: 06/16/2021 Creatinine 1.26, BUN 24, Potassium 3.9, Sodium 140, GFR 58 03/25/2021 Creatinine 1.39, BUN 24, Potassium 4.4, Sodium 141, GFR 52 03/09/2021 Creatinine 1.35, BUN 35, Potassium 4.3, Sodium 140, GFR 53  A complete set of results can be found in Results Review.   Recommendations:  Left voice mail with ICM number and encouraged to call if experiencing any fluid symptoms.   Follow-up plan: ICM clinic phone appointment on 09/27/2021.   91 day device clinic remote transmission 11/16/2021.     EP/Cardiology Office Visits: Recall 10/10/2021 with [name], PA   Copy of ICM check sent to Dr. Rayann Heman.     3 month ICM trend: 08/21/2021.    [name], RN 08/24/2021 11:43 AM

## 2021-08-24 NOTE — Telephone Encounter (Signed)
Remote ICM transmission received.  Attempted call to patient regarding ICM remote transmission and left detailed message per DPR.  Advised to return call for any fluid symptoms or questions. Next ICM remote transmission scheduled 09/27/2021.

## 2021-08-26 ENCOUNTER — Other Ambulatory Visit: Payer: Self-pay | Admitting: Physician Assistant

## 2021-08-26 DIAGNOSIS — I5022 Chronic systolic (congestive) heart failure: Secondary | ICD-10-CM

## 2021-08-26 NOTE — Progress Notes (Signed)
Remote ICD transmission.   

## 2021-09-07 ENCOUNTER — Telehealth: Payer: Self-pay

## 2021-09-07 NOTE — Telephone Encounter (Signed)
**Note De-Identified Tinisha Etzkorn Obfuscation** Providers page of both a [employer] pt asst Delene Loll) application and a [place] and [place] Wilder Glade) was faxed to [place] from Lelon Huh, CPhT with [facility] with a request to print a [employer], complete both providers pages, to have Dr Johney Frame sign both and the [employer], and to fax back to her.  I have completed both providers pages of the pts applications and have emailed all to Dr Jacolyn Reedy nurse so she can print the pts [employer], have Dr Holland Commons sign all when she returns to the office on Monday 11/21, and to fax all to [place] at the fax number written on the cover page included or to place in the to be faxed basket in Medical Records to be faxed.

## 2021-09-07 NOTE — Telephone Encounter (Signed)
Paperwork printed off and will have Dr. Johney Frame sign forms when she returns to the office next week on 09/13/21. Will fax completed paperwork accordingly thereafter, once forms signed off by Dr. Johney Frame.

## 2021-09-13 MED ORDER — ENTRESTO 24-26 MG PO TABS: 1.0000 | ORAL_TABLET | Freq: Two times a day (BID) | ORAL | 3 refills | Status: DC

## 2021-09-13 NOTE — Telephone Encounter (Signed)
Paperwork completed and signed off by Dr. Johney Frame.  RX for [employer] and also signed by Dr. Johney Frame. Placed in nurse fax box in medical records, to be faxed to contact information provided on cover sheet.  Will make Prior Auth Nurse aware of completion.

## 2021-09-28 ENCOUNTER — Other Ambulatory Visit: Payer: Self-pay | Admitting: Cardiology

## 2021-09-30 ENCOUNTER — Telehealth: Payer: Self-pay

## 2021-09-30 NOTE — Telephone Encounter (Signed)
LMOVM for patient to send missed ICM transmission. 

## 2021-10-06 NOTE — Progress Notes (Signed)
No ICM remote transmission received for 09/27/2021 and next ICM transmission scheduled for 10/14/2022.

## 2021-10-14 ENCOUNTER — Telehealth: Payer: Self-pay

## 2021-10-14 NOTE — Telephone Encounter (Signed)
No remote transmission received. ICM remote transmission rescheduled for 11/08/2021

## 2021-10-14 NOTE — Telephone Encounter (Signed)
Spoke with patient.  Advised to send remote transmission for fluid level review.  Discussed monitor connection and patient reports poor signal in his home which may be the reason monitor does not stay connected.  He will send remote transmission today and call [name] support to discuss monitor problem.

## 2021-10-16 ENCOUNTER — Other Ambulatory Visit: Payer: Self-pay | Admitting: Cardiology

## 2021-10-27 DIAGNOSIS — E039 Hypothyroidism, unspecified: Secondary | ICD-10-CM | POA: Diagnosis not present

## 2021-10-27 DIAGNOSIS — I4891 Unspecified atrial fibrillation: Secondary | ICD-10-CM | POA: Diagnosis not present

## 2021-10-27 DIAGNOSIS — G4733 Obstructive sleep apnea (adult) (pediatric): Secondary | ICD-10-CM | POA: Diagnosis not present

## 2021-10-27 DIAGNOSIS — G8929 Other chronic pain: Secondary | ICD-10-CM | POA: Diagnosis not present

## 2021-10-27 DIAGNOSIS — E785 Hyperlipidemia, unspecified: Secondary | ICD-10-CM | POA: Diagnosis not present

## 2021-10-27 DIAGNOSIS — I2510 Atherosclerotic heart disease of native coronary artery without angina pectoris: Secondary | ICD-10-CM | POA: Diagnosis not present

## 2021-10-27 DIAGNOSIS — I255 Ischemic cardiomyopathy: Secondary | ICD-10-CM | POA: Diagnosis not present

## 2021-10-27 DIAGNOSIS — I509 Heart failure, unspecified: Secondary | ICD-10-CM | POA: Diagnosis not present

## 2021-10-27 DIAGNOSIS — I110 Hypertensive heart disease with heart failure: Secondary | ICD-10-CM | POA: Diagnosis not present

## 2021-10-27 DIAGNOSIS — K219 Gastro-esophageal reflux disease without esophagitis: Secondary | ICD-10-CM | POA: Diagnosis not present

## 2021-10-27 DIAGNOSIS — E1159 Type 2 diabetes mellitus with other circulatory complications: Secondary | ICD-10-CM | POA: Diagnosis not present

## 2021-10-27 DIAGNOSIS — E669 Obesity, unspecified: Secondary | ICD-10-CM | POA: Diagnosis not present

## 2021-11-08 ENCOUNTER — Ambulatory Visit (INDEPENDENT_AMBULATORY_CARE_PROVIDER_SITE_OTHER): Payer: Medicare HMO

## 2021-11-08 DIAGNOSIS — Z95810 Presence of automatic (implantable) cardiac defibrillator: Secondary | ICD-10-CM | POA: Diagnosis not present

## 2021-11-08 DIAGNOSIS — H40013 Open angle with borderline findings, low risk, bilateral: Secondary | ICD-10-CM | POA: Diagnosis not present

## 2021-11-08 DIAGNOSIS — H26492 Other secondary cataract, left eye: Secondary | ICD-10-CM | POA: Diagnosis not present

## 2021-11-08 DIAGNOSIS — I5042 Chronic combined systolic (congestive) and diastolic (congestive) heart failure: Secondary | ICD-10-CM

## 2021-11-08 DIAGNOSIS — H26493 Other secondary cataract, bilateral: Secondary | ICD-10-CM | POA: Diagnosis not present

## 2021-11-08 DIAGNOSIS — H4321 Crystalline deposits in vitreous body, right eye: Secondary | ICD-10-CM | POA: Diagnosis not present

## 2021-11-08 DIAGNOSIS — H35363 Drusen (degenerative) of macula, bilateral: Secondary | ICD-10-CM | POA: Diagnosis not present

## 2021-11-08 LAB — CUP PACEART REMOTE DEVICE CHECK
Battery Remaining Longevity: 23 mo
Battery Remaining Percentage: 28 %
Battery Voltage: 2.86 V
Brady Statistic AP VP Percent: 9.3 %
Brady Statistic AP VS Percent: 1 %
Brady Statistic AS VP Percent: 73 %
Brady Statistic AS VS Percent: 9.3 %
Brady Statistic RA Percent Paced: 3 %
Date Time Interrogation Session: 20230115071253
HighPow Impedance: 72 Ohm
HighPow Impedance: 72 Ohm
Implantable Lead Implant Date: 20161011
Implantable Lead Implant Date: 20161011
Implantable Lead Implant Date: 20161011
Implantable Lead Location: 753858
Implantable Lead Location: 753859
Implantable Lead Location: 753860
Implantable Pulse Generator Implant Date: 20161011
Lead Channel Impedance Value: 390 Ohm
Lead Channel Impedance Value: 390 Ohm
Lead Channel Impedance Value: 650 Ohm
Lead Channel Pacing Threshold Amplitude: 0.5 V
Lead Channel Pacing Threshold Amplitude: 0.75 V
Lead Channel Pacing Threshold Amplitude: 0.875 V
Lead Channel Pacing Threshold Pulse Width: 0.5 ms
Lead Channel Pacing Threshold Pulse Width: 0.5 ms
Lead Channel Pacing Threshold Pulse Width: 0.8 ms
Lead Channel Sensing Intrinsic Amplitude: 1.9 mV
Lead Channel Sensing Intrinsic Amplitude: 7.4 mV
Lead Channel Setting Pacing Amplitude: 2 V
Lead Channel Setting Pacing Amplitude: 2 V
Lead Channel Setting Pacing Amplitude: 2 V
Lead Channel Setting Pacing Pulse Width: 0.5 ms
Lead Channel Setting Pacing Pulse Width: 0.8 ms
Lead Channel Setting Sensing Sensitivity: 0.5 mV
Pulse Gen Serial Number: 7276664

## 2021-11-10 NOTE — Progress Notes (Addendum)
EPIC Encounter for ICM Monitoring  Patient Name: [name] is a 81 y.o. male Date: 11/10/2021 Primary Care Physican: Orpah Melter, MD Electrophysiologist: Allred Bi-V Pacing: 79%            Last Weight: 255 lbs   AT/AF Burden: 1.5% (taking Eliquis)          Spoke with patient and heart failure questions reviewed.  Pt asymptomatic for fluid accumulation.  Pt reports he is recovering from the flu.     Corvue thoracic impedance suggesting normal fluid levels.   Prescribed: Furosemide 20 mg Take 2 tablets (40 mg total) by mouth daily in the AM.  Take 1 tablet (20 mg) as needed in the PM for swelling. (dosage decreased on 03/10/2021) Spironolactone 25 mg take 0.5 tablet once a day. Farxiga 10 mg take 1 tablet daily before breakfast   Labs: 06/16/2021 Creatinine 1.26, BUN 24, Potassium 3.9, Sodium 140, GFR 58 03/25/2021 Creatinine 1.39, BUN 24, Potassium 4.4, Sodium 141, GFR 52 03/09/2021 Creatinine 1.35, BUN 35, Potassium 4.3, Sodium 140, GFR 53  A complete set of results can be found in Results Review.   Recommendations:  No changes and encouraged to call if experiencing any fluid symptoms.   Follow-up plan: ICM clinic phone appointment on 12/13/2021.   91 day device clinic remote transmission 11/16/2021.     EP/Cardiology Office Visits: Recall 10/10/2021 with [name], PA.  Advised to call office to schedule appointment with [name], PA and Dr Johney Frame (last visit 02/2021)   Copy of ICM check sent to Dr. Rayann Heman.     3 month ICM trend: 11/07/2021.    12-14 Month ICM trend:     [name], RN 11/10/2021 10:58 AM

## 2021-11-17 ENCOUNTER — Telehealth: Payer: Self-pay

## 2021-11-17 NOTE — Telephone Encounter (Signed)
I let the patient know we did not receive his transmission. I told him the last transmission we have on file was 11-07-2021. The patient agreed to send the transmission today.

## 2021-11-22 ENCOUNTER — Other Ambulatory Visit: Payer: Self-pay | Admitting: Cardiology

## 2021-11-22 DIAGNOSIS — L249 Irritant contact dermatitis, unspecified cause: Secondary | ICD-10-CM | POA: Diagnosis not present

## 2021-11-22 NOTE — Progress Notes (Signed)
Electrophysiology Office Note Date: 11/23/2021  ID:  [name], [name] Jun 14, 1941, MRN [id number]  PCP: Orpah Melter, MD Primary Cardiologist: Ena Dawley, MD Electrophysiologist: [name], MD   CC: Routine ICD follow-up  [name] is a 81 y.o. male seen today for [name], MD for routine electrophysiology followup.  Since last being seen in our clinic the patient reports doing well.  he denies chest pain, palpitations, dyspnea, PND, orthopnea, nausea, vomiting, dizziness, syncope, edema, weight gain, or early satiety. He has not had ICD shocks.   Device History: St. Jude BiV ICD implanted 07/2015 for NICM  Past Medical History:  Diagnosis Date   Acid reflux    Biventricular ICD (implantable cardioverter-defibrillator) in place    CAD (coronary artery disease)    a. PCI to Circ with DES on 04/06/15.   Cardiomyopathy ([place])    a. mixed picture - suspected NICM but cannot exclude contribution from CAD.   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 s/p STJ CRTD 07/2015   Obesity    PAF (paroxysmal atrial fibrillation) (HCC)    Paroxysmal atrial fibrillation ([place])    a. identified on device interrogation. All episodes <3 hours   PVC's (premature ventricular contractions)    Ventricular fibrillation ([place])    a. appropriate ICD therapy 02/2017   Past Surgical History:  Procedure Laterality Date   CARDIAC CATHETERIZATION N/A 04/06/2015   Procedure: Right/Left Heart Cath and Coronary Angiography;  Surgeon: Larey Dresser, MD;  Location: [facility];  Service: Cardiovascular;  Laterality: N/A;   CARDIAC  CATHETERIZATION N/A 04/06/2015   Procedure: Coronary Stent Intervention;  Surgeon: Peter M Martinique, MD;  Location: [facility];  Service: Cardiovascular;  Laterality: N/A;   CATARACT EXTRACTION W/ INTRAOCULAR LENS  IMPLANT, BILATERAL Bilateral 11/2014   COLONOSCOPY  2009   EP IMPLANTABLE DEVICE N/A 08/04/2015   STJ CRTD implanted by Dr Rayann Heman for primary prevention/CHF   KNEE CARTILAGE SURGERY Left 1960   SKIN CANCER EXCISION  05/2005; 07/2005   excision of merkel cell   TONSILLECTOMY      Current Outpatient Medications  Medication Sig Dispense Refill   acetaminophen (TYLENOL) 325 MG tablet Take 650 mg by mouth every 6 (six) hours as needed for mild pain or moderate pain.     atorvastatin (LIPITOR) 80 MG tablet Take 1 tablet (80 mg total) by mouth daily. Please keep upcoming appt in October with Dr. Meda Coffee for future refills. Thank you 90 tablet 0   carvedilol (COREG) 6.25 MG tablet Take 1 tablet (6.25 mg total) by mouth 2 (two) times daily. 180 tablet 3   COVID-19 mRNA Vac-TriS, Pfizer, SUSP injection Inject into the muscle. 0.3 mL 0   dapagliflozin propanediol (FARXIGA) 10 MG TABS tablet Take 1 tablet (10 mg total) by mouth daily before breakfast. 30 tablet 6   ELIQUIS 5 MG TABS tablet TAKE 1 TABLET BY MOUTH TWICE A DAY 60 tablet 5   fluorouracil (EFUDEX) 5 % cream Apply 5 application topically daily as needed (Patches on face).   0   furosemide (LASIX) 20  MG tablet Take 2 tablets (40 mg total) by mouth daily in the AM.  Take 1 tablet (20 mg) as needed in the PM for swelling. 180 tablet 3   levothyroxine (SYNTHROID, LEVOTHROID) 50 MCG tablet Take 50 mcg by mouth daily.  5   POTASSIUM PO Take 550 mg by mouth daily.     sacubitril-valsartan (ENTRESTO) 24-26 MG Take 1 tablet by mouth 2 (two) times daily. 180 tablet 3   spironolactone (ALDACTONE) 25 MG tablet TAKE 1/2 TABLET BY MOUTH EVERY DAY 45 tablet 3   No current facility-administered medications for this visit.    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  2 grown sons have HTN.   Works as a Higher education careers adviser business)   Social Determinants of Radio broadcast assistant Strain: Not on Art therapist Insecurity: Not on file  Transportation Needs: Not on file  Physical Activity: Not on file  Stress: Not on file  Social Connections: Not on file  Intimate Partner Violence: Not on file    Family History: Family History  Problem Relation Age of Onset   Cancer Mother        breast   Lupus Mother    Cancer Father        prostate   Stroke Sister    Thyroid disease Sister    Heart attack Neg Hx    Hypertension Neg Hx     Review of Systems: All other systems reviewed and are otherwise negative except as noted above.   Physical Exam: Vitals:   11/23/21 0808  BP: 104/62  Pulse: 83  SpO2:  93%  Weight: 249 lb (112.9 kg)  Height: 5\' 11"  (1.803 m)     GEN- The patient is well appearing, alert and oriented x 3 today.   HEENT: normocephalic, atraumatic; sclera clear, conjunctiva pink; hearing intact; oropharynx clear; neck supple, no JVP Lymph- no cervical lymphadenopathy Lungs- Clear to ausculation bilaterally, normal work of breathing.  No wheezes, rales, rhonchi Heart- Regular rate and rhythm, no murmurs, rubs or gallops, PMI not laterally displaced GI- soft, non-tender, non-distended, bowel sounds present, no hepatosplenomegaly Extremities- no clubbing or cyanosis. No edema;  DP/PT/radial pulses 2+ bilaterally MS- no significant deformity or atrophy Skin- warm and dry, no rash or lesion; ICD pocket well healed Psych- euthymic mood, full affect Neuro- strength and sensation are intact  ICD interrogation- reviewed in detail today,  See PACEART report  EKG:  EKG is ordered today. Personal review of EKG ordered today shows BiV pacing 83 bpm, QRS 104 ms  Recent Labs: 03/09/2021: Magnesium 2.4; NT-Pro BNP 689 06/16/2021: BUN 24; Creatinine, Ser 1.26; Potassium 3.9; Sodium 140   Wt Readings from Last 3 Encounters:  11/23/21 249 lb (112.9 kg)  03/09/21 264 lb 6.4 oz (119.9 kg)  12/08/20 269 lb 9.6 oz (122.3 kg)     Other studies Reviewed: Additional studies/ records that were reviewed today include: Previous EP office notes.   Assessment and Plan:  1.  Chronic systolic dysfunction s/p St. Jude CRT-D  euvolemic today Stable on an appropriate medical regimen Normal ICD function See Pace Art report No changes today BiV pacing 79%. Change coreg to Toprol 50 mg daily.   2. Prior VF arrest No ICD shock  3. Paroxysmal atrial fibrillation <1% Continue Eliquis  4. Obesity Body mass index is 34.73 kg/m.   Current medicines are reviewed at length with the patient today.    Disposition:   Follow up with EP APP in 6 months   Signed, Shirley Friar, PA-C   11/23/2021 8:09 AM  [facility] [address] [place] [address] [phone] (office) [phone] (fax)

## 2021-11-23 ENCOUNTER — Other Ambulatory Visit: Payer: Self-pay

## 2021-11-23 ENCOUNTER — Encounter: Payer: Self-pay | Admitting: Student

## 2021-11-23 ENCOUNTER — Ambulatory Visit (INDEPENDENT_AMBULATORY_CARE_PROVIDER_SITE_OTHER): Payer: Medicare HMO | Admitting: Student

## 2021-11-23 VITALS — BP 104/62 | HR 83 | Ht 71.0 in | Wt 249.0 lb

## 2021-11-23 DIAGNOSIS — I493 Ventricular premature depolarization: Secondary | ICD-10-CM | POA: Diagnosis not present

## 2021-11-23 DIAGNOSIS — I5042 Chronic combined systolic (congestive) and diastolic (congestive) heart failure: Secondary | ICD-10-CM | POA: Diagnosis not present

## 2021-11-23 DIAGNOSIS — I480 Paroxysmal atrial fibrillation: Secondary | ICD-10-CM

## 2021-11-23 LAB — CUP PACEART INCLINIC DEVICE CHECK
Battery Remaining Longevity: 24 mo
Brady Statistic RA Percent Paced: 2.9 %
Brady Statistic RV Percent Paced: 79 %
Date Time Interrogation Session: 20230131084319
HighPow Impedance: 67.5 Ohm
Implantable Lead Implant Date: 20161011
Implantable Lead Implant Date: 20161011
Implantable Lead Implant Date: 20161011
Implantable Lead Location: 753858
Implantable Lead Location: 753859
Implantable Lead Location: 753860
Implantable Pulse Generator Implant Date: 20161011
Lead Channel Impedance Value: 325 Ohm
Lead Channel Impedance Value: 375 Ohm
Lead Channel Impedance Value: 675 Ohm
Lead Channel Pacing Threshold Amplitude: 0.75 V
Lead Channel Pacing Threshold Amplitude: 0.75 V
Lead Channel Pacing Threshold Amplitude: 0.75 V
Lead Channel Pacing Threshold Amplitude: 0.75 V
Lead Channel Pacing Threshold Amplitude: 0.875 V
Lead Channel Pacing Threshold Pulse Width: 0.5 ms
Lead Channel Pacing Threshold Pulse Width: 0.5 ms
Lead Channel Pacing Threshold Pulse Width: 0.5 ms
Lead Channel Pacing Threshold Pulse Width: 0.8 ms
Lead Channel Pacing Threshold Pulse Width: 0.8 ms
Lead Channel Sensing Intrinsic Amplitude: 3.9 mV
Lead Channel Sensing Intrinsic Amplitude: 6 mV
Lead Channel Setting Pacing Amplitude: 2 V
Lead Channel Setting Pacing Amplitude: 2 V
Lead Channel Setting Pacing Amplitude: 2 V
Lead Channel Setting Pacing Pulse Width: 0.5 ms
Lead Channel Setting Pacing Pulse Width: 0.8 ms
Lead Channel Setting Sensing Sensitivity: 0.5 mV
Pulse Gen Serial Number: 7276664

## 2021-11-23 LAB — CBC

## 2021-11-23 MED ORDER — METOPROLOL SUCCINATE ER 50 MG PO TB24: 50.0000 mg | ORAL_TABLET | Freq: Every day | ORAL | 3 refills | Status: DC

## 2021-11-23 NOTE — Patient Instructions (Signed)
Medication Instructions:  Your physician has recommended you make the following change in your medication:   DISCONTINUE: Carvedilol START: Metoprolol Succinate 50mg  daily at bedtime  *If you need a refill on your cardiac medications before your next appointment, please call your pharmacy*   Lab Work: TODAY: BMET, CBC  If you have labs (blood work) drawn today and your tests are completely normal, you will receive your results only by: [place] (if you have MyChart) OR A paper copy in the mail If you have any lab test that is abnormal or we need to change your treatment, we will call you to review the results.   Follow-Up: At [facility], you and your health needs are our priority.  As part of our continuing mission to provide you with exceptional heart care, we have created designated Provider Care Teams.  These Care Teams include your primary Cardiologist (physician) and Advanced Practice Providers (APPs -  Physician Assistants and Nurse Practitioners) who all work together to provide you with the care you need, when you need it.   Your next appointment:   6 month(s)  The format for your next appointment:   In Person  Provider:   Legrand Como "[name], PA-C

## 2021-11-24 LAB — CBC
Hematocrit: 40.4 % (ref 37.5–51.0)
Hemoglobin: 14 g/dL (ref 13.0–17.7)
MCH: 34.1 pg — ABNORMAL HIGH (ref 26.6–33.0)
MCHC: 34.7 g/dL (ref 31.5–35.7)
MCV: 99 fL — ABNORMAL HIGH (ref 79–97)
Platelets: 102 10*3/uL — ABNORMAL LOW (ref 150–450)
RBC: 4.1 x10E6/uL — ABNORMAL LOW (ref 4.14–5.80)
RDW: 13 % (ref 11.6–15.4)
WBC: 4.2 10*3/uL (ref 3.4–10.8)

## 2021-11-24 LAB — BASIC METABOLIC PANEL
BUN/Creatinine Ratio: 16 (ref 10–24)
BUN: 23 mg/dL (ref 8–27)
CO2: 25 mmol/L (ref 20–29)
Calcium: 9.2 mg/dL (ref 8.6–10.2)
Chloride: 103 mmol/L (ref 96–106)
Creatinine, Ser: 1.42 mg/dL — ABNORMAL HIGH (ref 0.76–1.27)
Glucose: 103 mg/dL — ABNORMAL HIGH (ref 70–99)
Potassium: 4.1 mmol/L (ref 3.5–5.2)
Sodium: 140 mmol/L (ref 134–144)
eGFR: 50 mL/min/{1.73_m2} — ABNORMAL LOW (ref >59)

## 2021-11-25 DIAGNOSIS — H26491 Other secondary cataract, right eye: Secondary | ICD-10-CM | POA: Diagnosis not present

## 2021-12-01 ENCOUNTER — Telehealth: Payer: Self-pay

## 2021-12-01 NOTE — Telephone Encounter (Signed)
**Note De-Identified Zain Bingman Obfuscation** Per request from Lelon Huh who is a Industrial/product designer with [employer] I called the pt to discuss the [employer] pt asst application for [place] and the [place] pt asst application for [place] that she mailed to the pt in 08/2021.  He stated that he was working on the applications as we spoke. I offered assistance but he stated he didn't feel that he needed any.   I did advise him that he can bring his completed applications with the required documents per each foundation (advised to see instruction page of both applications) to Dr Allred/Nelson's office at [employer] at [address] in [place] to drop off and that we will take care of the providers page for each and will fax all to the appropriate foundations.  He states that he will bring his application to drop off in the front office once he completes them.  This was a long phone call and I answered all of his questions that I could and advised him to contact the foundations for the answers to his questions that I could not answer.   He is aware that since this is a new year that he is applying for 2023 as the deadline for 2022 has past.   He thanked me for calling him to discuss the above.

## 2021-12-22 NOTE — Telephone Encounter (Signed)
**Note De-Identified Azalee Weimer Obfuscation** Letter received from [facility] and [facility] stating that the pt has been approved for Farxiga asst until 10/23/2022. ?Pt ID: [id number] ? ?The letter states that they have notified the pt of this approval as well. ?

## 2021-12-24 NOTE — Progress Notes (Signed)
No ICM remote transmission received for 12/21/2021 and next ICM transmission scheduled for 01/17/2022.   ?

## 2021-12-27 DIAGNOSIS — I2510 Atherosclerotic heart disease of native coronary artery without angina pectoris: Secondary | ICD-10-CM | POA: Diagnosis not present

## 2021-12-27 DIAGNOSIS — D6869 Other thrombophilia: Secondary | ICD-10-CM | POA: Diagnosis not present

## 2021-12-27 DIAGNOSIS — Z0000 Encounter for general adult medical examination without abnormal findings: Secondary | ICD-10-CM | POA: Diagnosis not present

## 2021-12-27 DIAGNOSIS — I480 Paroxysmal atrial fibrillation: Secondary | ICD-10-CM | POA: Diagnosis not present

## 2021-12-27 DIAGNOSIS — I509 Heart failure, unspecified: Secondary | ICD-10-CM | POA: Diagnosis not present

## 2021-12-27 DIAGNOSIS — E7800 Pure hypercholesterolemia, unspecified: Secondary | ICD-10-CM | POA: Diagnosis not present

## 2021-12-27 DIAGNOSIS — E039 Hypothyroidism, unspecified: Secondary | ICD-10-CM | POA: Diagnosis not present

## 2021-12-27 DIAGNOSIS — I10 Essential (primary) hypertension: Secondary | ICD-10-CM | POA: Diagnosis not present

## 2021-12-27 DIAGNOSIS — N183 Chronic kidney disease, stage 3 unspecified: Secondary | ICD-10-CM | POA: Diagnosis not present

## 2022-01-14 ENCOUNTER — Other Ambulatory Visit: Payer: Self-pay | Admitting: *Deleted

## 2022-01-14 DIAGNOSIS — I2510 Atherosclerotic heart disease of native coronary artery without angina pectoris: Secondary | ICD-10-CM

## 2022-01-14 DIAGNOSIS — E7849 Other hyperlipidemia: Secondary | ICD-10-CM

## 2022-01-14 DIAGNOSIS — I5042 Chronic combined systolic (congestive) and diastolic (congestive) heart failure: Secondary | ICD-10-CM

## 2022-01-14 DIAGNOSIS — I5022 Chronic systolic (congestive) heart failure: Secondary | ICD-10-CM

## 2022-01-14 DIAGNOSIS — E785 Hyperlipidemia, unspecified: Secondary | ICD-10-CM

## 2022-01-14 DIAGNOSIS — Z79899 Other long term (current) drug therapy: Secondary | ICD-10-CM

## 2022-01-14 DIAGNOSIS — Z95810 Presence of automatic (implantable) cardiac defibrillator: Secondary | ICD-10-CM

## 2022-01-14 MED ORDER — DAPAGLIFLOZIN PROPANEDIOL 10 MG PO TABS: 10.0000 mg | ORAL_TABLET | Freq: Every day | ORAL | 0 refills | Status: DC

## 2022-01-17 ENCOUNTER — Ambulatory Visit (INDEPENDENT_AMBULATORY_CARE_PROVIDER_SITE_OTHER): Payer: Medicare HMO

## 2022-01-17 DIAGNOSIS — I5042 Chronic combined systolic (congestive) and diastolic (congestive) heart failure: Secondary | ICD-10-CM | POA: Diagnosis not present

## 2022-01-17 DIAGNOSIS — Z95810 Presence of automatic (implantable) cardiac defibrillator: Secondary | ICD-10-CM | POA: Diagnosis not present

## 2022-01-20 ENCOUNTER — Telehealth: Payer: Self-pay

## 2022-01-20 NOTE — Telephone Encounter (Signed)
LMOVM for patient to send missed ICM transmission. 

## 2022-01-21 ENCOUNTER — Telehealth: Payer: Self-pay

## 2022-01-21 NOTE — Telephone Encounter (Signed)
Presenting rhythm AF with controlled ventricular rates ongoing from 3/30 '@21'$ :36 ?Burden 29%, Eliquis, Metoprolol ?BiV pacing 72%, trend 70-80% ?Route to triage ? ?Per review of last office note, Pt was changed from carvedilol to metoprolol.  BIV pacing percentage at that office visit was noted to be 79%. ? ?Discussed with AT.  Advised to schedule office visit to determine options to increase BIV pacing. ? ?Will send to scheduling. ?

## 2022-01-21 NOTE — Progress Notes (Signed)
EPIC Encounter for ICM Monitoring ? ?Patient Name: [name] is a 81 y.o. male ?Date: 01/21/2022 ?Primary Care Physican: Orpah Melter, MD ?Electrophysiologist: Allred ?Bi-V Pacing: 72%            ?Last Weight: 255 lbs ?  ?AT/AF Burden: 29% (taking Eliquis) ?  ?       Attempted call to patient and unable to reach.  Left detailed message per DPR regarding transmission. Transmission reviewed.  ?  ?Corvue thoracic impedance suggesting normal fluid levels.  Pt scheduled 02/02/2022 to check BiV pacing. ?  ?Prescribed: ?Furosemide 20 mg Take 2 tablets (40 mg total) by mouth daily in the AM.  Take 1 tablet (20 mg) as needed in the PM for swelling. (dosage decreased on 03/10/2021) ?Spironolactone 25 mg take 0.5 tablet once a day. ?Farxiga 10 mg take 1 tablet daily before breakfast ?  ?Labs: ?06/16/2021 Creatinine 1.26, BUN 24, Potassium 3.9, Sodium 140, GFR 58 ?03/25/2021 Creatinine 1.39, BUN 24, Potassium 4.4, Sodium 141, GFR 52 ?03/09/2021 Creatinine 1.35, BUN 35, Potassium 4.3, Sodium 140, GFR 53  ?A complete set of results can be found in Results Review. ?  ?Recommendations:  Left voice mail with ICM number and encouraged to call if experiencing any fluid symptoms. ?  ?Follow-up plan: ICM clinic phone appointment on 02/21/2022.   91 day device clinic remote transmission 02/15/2022.   ?  ?EP/Cardiology Office Visits:  02/03/2022 with [name], [place].     03/15/2022 with Dr Johney Frame. ?  ?Copy of ICM check sent to Dr. Rayann Heman.    ? ?3 month ICM trend: 01/20/2022. ? ? ? ?12-14 Month ICM trend:  ? ? ? ?[name], RN ?01/21/2022 ?4:51 PM ? ?

## 2022-01-21 NOTE — Telephone Encounter (Signed)
Remote ICM transmission received.  Attempted call to patient regarding ICM remote transmission and left detailed message per DPR.  Advised to return call for any fluid symptoms or questions. Next ICM remote transmission scheduled 02/21/2022.   ? ?

## 2022-01-28 NOTE — Progress Notes (Signed)
? ? ?Electrophysiology Office Note ?Date: 02/02/2022 ? ?ID:  [name], DOB [birth year], MRN [id number] ? ?PCP: Orpah Melter, MD ?Primary Cardiologist: Ena Dawley, MD ?Electrophysiologist: [name], MD  ? ?CC: Routine ICD follow-up ? ?[name] is a 81 y.o. male seen today for [name], MD for routine electrophysiology followup.  Since last being seen in our clinic the patient reports doing well overall. BP mildly low today but asymptomatic.  he denies chest pain, palpitations, dyspnea, PND, orthopnea, nausea, vomiting, dizziness, syncope, edema, weight gain, or early satiety. He has not had ICD shocks.  ? ?Device History: ?St. Jude BiV ICD implanted 07/2015 for NICM ? ?Past Medical History:  ?Diagnosis Date  ? Acid reflux   ? Biventricular ICD (implantable cardioverter-defibrillator) in place   ? CAD (coronary artery disease)   ? a. PCI to Circ with DES on 04/06/15.  ? Cardiomyopathy ([place])   ? a. mixed picture - suspected NICM but cannot exclude contribution from CAD.  ? Chronic systolic CHF (congestive heart failure) ([place])   ? Degenerative disc disease, thoracic   ? Essential hypertension   ? Hyperlipidemia   ? Hyperplastic colon polyp   ? LBBB (left bundle branch block)   ? 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 s/p gluteal resection, lymph node dissection, chemo/ radiation  ? Mild dilation of ascending aorta (HCC)   ? Nonischemic cardiomyopathy ([place])   ? a. s/p STJ CRTD 07/2015  ? Obesity   ? PAF (paroxysmal atrial fibrillation) ([place])   ? Paroxysmal atrial fibrillation (HCC)   ? a. identified on device interrogation. All episodes <3 hours  ? PVC's (premature ventricular contractions)   ? Ventricular fibrillation ([place])   ? a. appropriate ICD therapy 02/2017  ? ?Past Surgical History:  ?Procedure Laterality Date  ? CARDIAC CATHETERIZATION N/A 04/06/2015  ? Procedure: Right/Left Heart Cath and Coronary Angiography;  Surgeon: Larey Dresser, MD;  Location: [facility];  Service:  Cardiovascular;  Laterality: N/A;  ? CARDIAC CATHETERIZATION N/A 04/06/2015  ? Procedure: Coronary Stent Intervention;  Surgeon: Peter M Martinique, MD;  Location: [facility];  Service: Cardiovascular;  Laterality: N/A;  ? CATARACT EXTRACTION W/ INTRAOCULAR LENS  IMPLANT, BILATERAL Bilateral 11/2014  ? COLONOSCOPY  2009  ? EP IMPLANTABLE DEVICE N/A 08/04/2015  ? STJ CRTD implanted by Dr Rayann Heman for primary prevention/CHF  ? KNEE CARTILAGE SURGERY Left 1960  ? SKIN CANCER EXCISION  05/2005; 07/2005  ? excision of merkel cell  ? TONSILLECTOMY    ? ? ?Current Outpatient Medications  ?Medication Sig Dispense Refill  ? atorvastatin (LIPITOR) 80 MG tablet Take 1 tablet (80 mg total) by mouth daily. Please keep upcoming appt in October with Dr. Meda Coffee for future refills. Thank you 90 tablet 0  ? COVID-19 mRNA Vac-TriS, Pfizer, SUSP injection Inject into the muscle. 0.3 mL 0  ? dapagliflozin propanediol (FARXIGA) 10 MG TABS tablet Take 1 tablet (10 mg total) by mouth daily before breakfast. 90 tablet 0  ? ELIQUIS 5 MG TABS tablet TAKE 1 TABLET BY MOUTH TWICE A DAY 60 tablet 5  ? fluorouracil (EFUDEX) 5 % cream Apply 5 application topically daily as needed (Patches on face).   0  ? furosemide (LASIX) 20 MG tablet Take 2 tablets (40 mg total) by mouth daily in the AM.  Take 1 tablet (20 mg) as needed in the PM for swelling. 180 tablet 3  ? levothyroxine (SYNTHROID, LEVOTHROID) 50 MCG tablet Take 50 mcg  by mouth daily.  5  ? metoprolol succinate (TOPROL-XL) 50 MG 24 hr tablet Take 1 tablet (50 mg total) by mouth at bedtime. Take with or immediately following a meal. 90 tablet 3  ? POTASSIUM PO Take 550 mg by mouth daily.    ? sacubitril-valsartan (ENTRESTO) 24-26 MG Take 1 tablet by mouth 2 (two) times daily. 180 tablet 3  ? spironolactone (ALDACTONE) 25 MG tablet TAKE 1/2 TABLET BY MOUTH EVERY DAY 45 tablet 3  ? acetaminophen (TYLENOL) 325 MG tablet Take 650 mg by mouth every 6 (six) hours as needed for mild pain or moderate  pain. (Patient not taking: Reported on 02/02/2022)    ? ?No current facility-administered medications for this visit.  ? ?Facility-Administered Medications Ordered in Other Visits  ?Medication Dose Route Frequency Provider Last Rate Last Admin  ? adenosine (diagnostic) (ADENOSCAN) infusion 68.4 mg  0.56 mg/kg Intravenous Once Lelon Perla, MD      ? ? ?Allergies:   Wound dressing adhesive and Tape  ? ?Social History: ?Social History  ? ?Socioeconomic History  ? Marital status: Married  ?  Spouse name: [name]  ? Number of children: 2  ? Years of education: college  ? Highest education level: Not on file  ?Occupational History  ? 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 Smoking status: Former  ?  Packs/day: 0.25  ?  Years: 15.00  ?  Pack years: 3.75  ?  Types: Cigarettes  ?  Quit date: 10/25/1983  ?  Years since quitting: 38.3  ? Smokeless tobacco: Never  ?Vaping Use  ? Vaping Use: Never used  ?Substance and Sexual Activity  ? Alcohol use: Yes  ?  Alcohol/week: 1.0 standard drink  ?  Types: 1 Cans of beer per week  ?  Comment: 08/04/2015 "I doubt if I average a glass of wine and 1 beer q week"  ? Drug use: No  ? Sexual activity: Not Currently  ?Other Topics Concern  ? Not on file  ?Social History Narrative  ? Pt lives in [place], married x 45 years.  2 grown sons have HTN.  ? 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 ?Social Determinants of Health  ? ?Financial Resource Strain: Not on file  ?Food Insecurity: Not on file  ?Transportation Needs: Not on file  ?Physical Activity: Not on file  ?Stress: Not on file  ?Social Connections: Not on file  ?Intimate Partner Violence: Not on file  ? ? ?Family History: ?Family History  ?Problem Relation Age of Onset  ? Cancer Mother   ?     breast  ? Lupus Mother   ? Cancer Father   ?     prostate  ? Stroke Sister   ? Thyroid disease Sister   ? Heart attack Neg Hx   ? Hypertension Neg Hx   ? ? ?Review of Systems: ?All other systems reviewed and are  otherwise negative except as noted above. ? ? ?Physical Exam: ?Vitals:  ? 02/02/22 0758  ?BP: 90/62  ?Pulse: 71  ?SpO2: 96%  ?Weight: 242 lb (109.8 kg)  ?Height: '5\' 11"'$  (1.803 m)  ?  ? ?GEN- The patient is well appearing, alert and oriented x 3 today.   ?HEENT: normocephalic, atraumatic; sclera clear, conjunctiva pink; hearing intact; oropharynx clear; neck supple, no JVP ?Lymph- no cervical lymphadenopathy ?Lungs- Clear to ausculation bilaterally, normal work of breathing.  No wheezes, rales, rhonchi ?Heart- Regular rate and rhythm, no murmurs, rubs or  gallops, PMI not laterally displaced ?GI- soft, non-tender, non-distended, bowel sounds present, no hepatosplenomegaly ?Extremities- no clubbing or cyanosis. No edema; DP/PT/radial pulses 2+ bilaterally ?MS- no significant deformity or atrophy ?Skin- warm and dry, no rash or lesion; ICD pocket well healed ?Psych- euthymic mood, full affect ?Neuro- strength and sensation are intact ? ?ICD interrogation- reviewed in detail today,  See PACEART report ? ?EKG:  EKG is not ordered today. ? ?Recent Labs: ?03/09/2021: Magnesium 2.4; NT-Pro BNP 689 ?11/23/2021: BUN 23; Creatinine, Ser 1.42; Hemoglobin 14.0; Platelets 102; Potassium 4.1; Sodium 140  ? ?Wt Readings from Last 3 Encounters:  ?02/02/22 242 lb (109.8 kg)  ?11/23/21 249 lb (112.9 kg)  ?03/09/21 264 lb 6.4 oz (119.9 kg)  ?  ? ?Other studies Reviewed: ?Additional studies/ records that were reviewed today include: Previous EP office notes.  ? ?Assessment and Plan: ? ?1.  Chronic systolic dysfunction s/p St. Jude CRT-D  ?euvolemic today ?Stable on an appropriate medical regimen ?Normal ICD function ?See Claudia Desanctis Art report ?PVAB and atrial sensitivity adjusted.  ?If cannot improve BiV pacing with programming, Will update echo to see if further adjustments necessary or if EF maintaining.  ? ?2. Prior VF arrest ?No ICD shock ?  ?3. Paroxysmal atrial fibrillation  ?Burden 24%, possibly more due to undersensing.  ?Programming  changes above made in attempt to better sense and hopefully maintain better BiV pacing.  ?Continue Eliquis ?  ?4. Obesity ?Body mass index is 33.75 kg/m?.  ? ?Current medicines are reviewed at length with th

## 2022-02-02 ENCOUNTER — Encounter: Payer: Self-pay | Admitting: Student

## 2022-02-02 ENCOUNTER — Ambulatory Visit: Payer: Medicare HMO | Admitting: Student

## 2022-02-02 VITALS — BP 90/62 | HR 71 | Ht 71.0 in | Wt 242.0 lb

## 2022-02-02 DIAGNOSIS — I480 Paroxysmal atrial fibrillation: Secondary | ICD-10-CM

## 2022-02-02 DIAGNOSIS — Z95810 Presence of automatic (implantable) cardiac defibrillator: Secondary | ICD-10-CM | POA: Diagnosis not present

## 2022-02-02 DIAGNOSIS — I5042 Chronic combined systolic (congestive) and diastolic (congestive) heart failure: Secondary | ICD-10-CM | POA: Diagnosis not present

## 2022-02-02 LAB — CUP PACEART INCLINIC DEVICE CHECK
Battery Remaining Longevity: 24 mo
Brady Statistic RA Percent Paced: 5.2 %
Brady Statistic RV Percent Paced: 74 %
Date Time Interrogation Session: 20230412082432
HighPow Impedance: 74.25 Ohm
Implantable Lead Implant Date: 20161011
Implantable Lead Implant Date: 20161011
Implantable Lead Implant Date: 20161011
Implantable Lead Location: 753858
Implantable Lead Location: 753859
Implantable Lead Location: 753860
Implantable Pulse Generator Implant Date: 20161011
Lead Channel Impedance Value: 350 Ohm
Lead Channel Impedance Value: 387.5 Ohm
Lead Channel Impedance Value: 737.5 Ohm
Lead Channel Pacing Threshold Amplitude: 0.75 V
Lead Channel Pacing Threshold Amplitude: 0.75 V
Lead Channel Pacing Threshold Amplitude: 0.875 V
Lead Channel Pacing Threshold Amplitude: 1 V
Lead Channel Pacing Threshold Amplitude: 1 V
Lead Channel Pacing Threshold Pulse Width: 0.5 ms
Lead Channel Pacing Threshold Pulse Width: 0.5 ms
Lead Channel Pacing Threshold Pulse Width: 0.5 ms
Lead Channel Pacing Threshold Pulse Width: 0.8 ms
Lead Channel Pacing Threshold Pulse Width: 0.8 ms
Lead Channel Sensing Intrinsic Amplitude: 10.2 mV
Lead Channel Sensing Intrinsic Amplitude: 2.7 mV
Lead Channel Setting Pacing Amplitude: 2 V
Lead Channel Setting Pacing Amplitude: 2 V
Lead Channel Setting Pacing Amplitude: 2 V
Lead Channel Setting Pacing Pulse Width: 0.5 ms
Lead Channel Setting Pacing Pulse Width: 0.8 ms
Lead Channel Setting Sensing Sensitivity: 0.5 mV
Pulse Gen Serial Number: 7276664

## 2022-02-02 NOTE — Patient Instructions (Signed)
Medication Instructions:  ?Your physician recommends that you continue on your current medications as directed. Please refer to the Current Medication list given to you today. ? ?*If you need a refill on your cardiac medications before your next appointment, please call your pharmacy* ? ? ?Lab Work: ?None ?If you have labs (blood work) drawn today and your tests are completely normal, you will receive your results only by: ?MyChart Message (if you have MyChart) OR ?A paper copy in the mail ?If you have any lab test that is abnormal or we need to change your treatment, we will call you to review the results. ? ? ?Follow-Up: ?At [facility], you and your health needs are our priority.  As part of our continuing mission to provide you with exceptional heart care, we have created designated Provider Care Teams.  These Care Teams include your primary Cardiologist (physician) and Advanced Practice Providers (APPs -  Physician Assistants and Nurse Practitioners) who all work together to provide you with the care you need, when you need it. ? ?We recommend signing up for the patient portal called "MyChart".  Sign up information is provided on this After Visit Summary.  MyChart is used to connect with patients for Virtual Visits (Telemedicine).  Patients are able to view lab/test results, encounter notes, upcoming appointments, etc.  Non-urgent messages can be sent to your provider as well.   ?To learn more about what you can do with MyChart, go to [email or web address].   ? ?Your next appointment:   ?03/02/2022 ? ?Important Information About Sugar ? ? ? ? ?  ?

## 2022-02-15 ENCOUNTER — Ambulatory Visit (INDEPENDENT_AMBULATORY_CARE_PROVIDER_SITE_OTHER): Payer: Medicare HMO

## 2022-02-15 DIAGNOSIS — I255 Ischemic cardiomyopathy: Secondary | ICD-10-CM

## 2022-02-15 DIAGNOSIS — I5042 Chronic combined systolic (congestive) and diastolic (congestive) heart failure: Secondary | ICD-10-CM | POA: Diagnosis not present

## 2022-02-17 LAB — CUP PACEART REMOTE DEVICE CHECK
Battery Remaining Longevity: 22 mo
Battery Remaining Percentage: 27 %
Battery Voltage: 2.83 V
Brady Statistic AP VP Percent: 13 %
Brady Statistic AP VS Percent: 1 %
Brady Statistic AS VP Percent: 73 %
Brady Statistic AS VS Percent: 5.9 %
Brady Statistic RA Percent Paced: 5.7 %
Date Time Interrogation Session: 20230426210138
HighPow Impedance: 70 Ohm
HighPow Impedance: 70 Ohm
Implantable Lead Implant Date: 20161011
Implantable Lead Implant Date: 20161011
Implantable Lead Implant Date: 20161011
Implantable Lead Location: 753858
Implantable Lead Location: 753859
Implantable Lead Location: 753860
Implantable Pulse Generator Implant Date: 20161011
Lead Channel Impedance Value: 330 Ohm
Lead Channel Impedance Value: 380 Ohm
Lead Channel Impedance Value: 650 Ohm
Lead Channel Pacing Threshold Amplitude: 0.75 V
Lead Channel Pacing Threshold Amplitude: 1 V
Lead Channel Pacing Threshold Amplitude: 1 V
Lead Channel Pacing Threshold Pulse Width: 0.5 ms
Lead Channel Pacing Threshold Pulse Width: 0.5 ms
Lead Channel Pacing Threshold Pulse Width: 0.8 ms
Lead Channel Sensing Intrinsic Amplitude: 2.9 mV
Lead Channel Sensing Intrinsic Amplitude: 9.7 mV
Lead Channel Setting Pacing Amplitude: 2 V
Lead Channel Setting Pacing Amplitude: 2 V
Lead Channel Setting Pacing Amplitude: 2 V
Lead Channel Setting Pacing Pulse Width: 0.5 ms
Lead Channel Setting Pacing Pulse Width: 0.8 ms
Lead Channel Setting Sensing Sensitivity: 0.5 mV
Pulse Gen Serial Number: 7276664

## 2022-02-18 ENCOUNTER — Other Ambulatory Visit: Payer: Self-pay | Admitting: *Deleted

## 2022-02-18 MED ORDER — ENTRESTO 24-26 MG PO TABS: 1.0000 | ORAL_TABLET | Freq: Two times a day (BID) | ORAL | 0 refills | Status: DC

## 2022-02-21 ENCOUNTER — Ambulatory Visit (INDEPENDENT_AMBULATORY_CARE_PROVIDER_SITE_OTHER): Payer: Medicare HMO

## 2022-02-21 DIAGNOSIS — I5042 Chronic combined systolic (congestive) and diastolic (congestive) heart failure: Secondary | ICD-10-CM | POA: Diagnosis not present

## 2022-02-21 DIAGNOSIS — Z95810 Presence of automatic (implantable) cardiac defibrillator: Secondary | ICD-10-CM

## 2022-02-22 NOTE — Progress Notes (Signed)
? ? ?Electrophysiology Office Note ?Date: 03/02/2022 ? ?ID:  TINO RONAN, DOB 12/18/1940, MRN [id number] ? ?PCP: Orpah Melter, MD ?Primary Cardiologist: Ena Dawley, MD ?Electrophysiologist: [name], MD  ? ?CC: Routine ICD follow-up ? ?[name] is a 81 y.o. male seen today for [name], MD for routine electrophysiology followup.   ? ?Last visit PVAB was shortened and atrial lead made more sensitive in attempt to promote BiV pacing. BiV % 74% -> 83%.  ? ?Since that visit, he has been doing well. Doing his ADLs without difficulty. Remains SOB with more than moderate exertion. Otherwise, he denies symptoms of palpitations, chest pain, orthopnea, PND, lower extremity edema, claudication, dizziness, presyncope, syncope, bleeding, or neurologic sequela. The patient is tolerating medications without difficulties.   ? ?Device History: ?St. Jude BiV ICD implanted 07/2015 for NICM ? ?Past Medical History:  ?Diagnosis Date  ? Acid reflux   ? Biventricular ICD (implantable cardioverter-defibrillator) in place   ? CAD (coronary artery disease)   ? a. PCI to Circ with DES on 04/06/15.  ? Cardiomyopathy ([place])   ? a. mixed picture - suspected NICM but cannot exclude contribution from CAD.  ? Chronic systolic CHF (congestive heart failure) ([place])   ? Degenerative disc disease, thoracic   ? Essential hypertension   ? Hyperlipidemia   ? Hyperplastic colon polyp   ? LBBB (left bundle branch block)   ? 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 s/p gluteal resection, lymph node dissection, chemo/ radiation  ? Mild dilation of ascending aorta (H[facility])   ? Nonischemic cardiomyopathy ([place])   ? a. s/p STJ CRTD 07/2015  ? Obesity   ? PAF (paroxysmal atrial fibrillation) ([place])   ? Paroxysmal atrial fibrillation (H[facility])   ? a. identified on device interrogation. All episodes <3 hours  ? PVC's (premature ventricular contractions)   ? Ventricular fibrillation ([place])   ? a. appropriate ICD therapy 02/2017  ? ?Past Surgical  History:  ?Procedure Laterality Date  ? CARDIAC CATHETERIZATION N/A 04/06/2015  ? Procedure: Right/Left Heart Cath and Coronary Angiography;  Surgeon: Larey Dresser, MD;  Location: [facility];  Service: Cardiovascular;  Laterality: N/A;  ? CARDIAC CATHETERIZATION N/A 04/06/2015  ? Procedure: Coronary Stent Intervention;  Surgeon: Peter M Martinique, MD;  Location: [facility];  Service: Cardiovascular;  Laterality: N/A;  ? CATARACT EXTRACTION W/ INTRAOCULAR LENS  IMPLANT, BILATERAL Bilateral 11/2014  ? COLONOSCOPY  2009  ? EP IMPLANTABLE DEVICE N/A 08/04/2015  ? STJ CRTD implanted by Dr Rayann Heman for primary prevention/CHF  ? KNEE CARTILAGE SURGERY Left 1960  ? SKIN CANCER EXCISION  05/2005; 07/2005  ? excision of merkel cell  ? TONSILLECTOMY    ? ? ?Current Outpatient Medications  ?Medication Sig Dispense Refill  ? acetaminophen (TYLENOL) 325 MG tablet Take 650 mg by mouth every 6 (six) hours as needed for mild pain or moderate pain.    ? atorvastatin (LIPITOR) 80 MG tablet Take 1 tablet (80 mg total) by mouth daily. Please keep upcoming appt in October with Dr. Meda Coffee for future refills. Thank you 90 tablet 0  ? COVID-19 mRNA Vac-TriS, Pfizer, SUSP injection Inject into the muscle. 0.3 mL 0  ? dapagliflozin propanediol (FARXIGA) 10 MG TABS tablet Take 1 tablet (10 mg total) by mouth daily before breakfast. 90 tablet 0  ? ELIQUIS 5 MG TABS tablet TAKE 1 TABLET BY MOUTH TWICE A DAY 60 tablet 5  ? fluorouracil (EFUDEX) 5 % cream Apply 5  application topically daily as needed (Patches on face).   0  ? furosemide (LASIX) 20 MG tablet Take 2 tablets (40 mg total) by mouth daily in the AM.  Take 1 tablet (20 mg) as needed in the PM for swelling. 180 tablet 3  ? levothyroxine (SYNTHROID, LEVOTHROID) 50 MCG tablet Take 50 mcg by mouth daily.  5  ? POTASSIUM PO Take 550 mg by mouth daily.    ? sacubitril-valsartan (ENTRESTO) 24-26 MG Take 1 tablet by mouth 2 (two) times daily. 180 tablet 0  ? spironolactone (ALDACTONE)  25 MG tablet TAKE 1/2 TABLET BY MOUTH EVERY DAY 45 tablet 3  ? metoprolol succinate (TOPROL-XL) 50 MG 24 hr tablet Take 1 tablet (50 mg total) by mouth at bedtime. Take with or immediately following a meal. 90 tablet 3  ? ?No current facility-administered medications for this visit.  ? ?Facility-Administered Medications Ordered in Other Visits  ?Medication Dose Route Frequency Provider Last Rate Last Admin  ? adenosine (diagnostic) (ADENOSCAN) infusion 68.4 mg  0.56 mg/kg Intravenous Once Lelon Perla, MD      ? ? ?Allergies:   Wound dressing adhesive and Tape  ? ?Social History: ?Social History  ? ?Socioeconomic History  ? Marital status: Married  ?  Spouse name: bridgette  ? Number of children: 2  ? Years of education: college  ? Highest education level: Not on file  ?Occupational History  ? 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 Smoking status: Former  ?  Packs/day: 0.25  ?  Years: 15.00  ?  Pack years: 3.75  ?  Types: Cigarettes  ?  Quit date: 10/25/1983  ?  Years since quitting: 38.3  ? Smokeless tobacco: Never  ?Vaping Use  ? Vaping Use: Never used  ?Substance and Sexual Activity  ? Alcohol use: Yes  ?  Alcohol/week: 1.0 standard drink  ?  Types: 1 Cans of beer per week  ?  Comment: 08/04/2015 "I doubt if I average a glass of wine and 1 beer q week"  ? Drug use: No  ? Sexual activity: Not Currently  ?Other Topics Concern  ? Not on file  ?Social History Narrative  ? Pt lives in [place], married x 45 years.  2 grown sons have HTN.  ? 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 ?Social Determinants of Health  ? ?Financial Resource Strain: Not on file  ?Food Insecurity: Not on file  ?Transportation Needs: Not on file  ?Physical Activity: Not on file  ?Stress: Not on file  ?Social Connections: Not on file  ?Intimate Partner Violence: Not on file  ? ? ?Family History: ?Family History  ?Problem Relation Age of Onset  ? Cancer Mother   ?     breast  ? Lupus Mother   ? Cancer Father   ?      prostate  ? Stroke Sister   ? Thyroid disease Sister   ? Heart attack Neg Hx   ? Hypertension Neg Hx   ? ? ?Review of systems complete and found to be negative unless listed in HPI.   ? ? ?Physical Exam: ?Vitals:  ? 03/02/22 0745  ?BP: 110/64  ?Pulse: 75  ?SpO2: 93%  ?Weight: 244 lb (110.7 kg)  ?Height: '5\' 11"'$  (1.803 m)  ? ?  ?General: Pleasant, NAD. No resp difficulty ?Psych: Normal affect. ?HEENT:  Normal, without mass or lesion.         ?Neck: Supple, no bruits or JVD. Carotids  2+. No lymphadenopathy/thyromegaly appreciated. ?Heart: PMI nondisplaced. RRR no s3, s4, or murmurs. ?Lungs:  Resp regular and unlabored, CTA. ?Abdomen: Soft, non-tender, non-distended, No HSM, BS + x 4.   ?Extremities: No clubbing, cyanosis or edema. DP/PT/Radials 2+ and equal bilaterally. ?Neuro: Alert and oriented X 3. Moves all extremities spontaneously.  ? ?ICD interrogation- Remote from 4/27 reviewed personally. Not checked today other than to briefly check pacing % ? ?EKG:  EKG is not ordered today. ? ?Recent Labs: ?03/09/2021: Magnesium 2.4; NT-Pro BNP 689 ?11/23/2021: BUN 23; Creatinine, Ser 1.42; Hemoglobin 14.0; Platelets 102; Potassium 4.1; Sodium 140  ? ?Wt Readings from Last 3 Encounters:  ?03/02/22 244 lb (110.7 kg)  ?02/02/22 242 lb (109.8 kg)  ?11/23/21 249 lb (112.9 kg)  ?  ? ?Other studies Reviewed: ?Additional studies/ records that were reviewed today include: Previous EP office notes.  ? ?Assessment and Plan: ? ?1.  Chronic systolic dysfunction s/p St. Jude CRT-D ?Volume status stable on exam. ?Stable on an appropriate medical regimen ?Normal ICD function ?See [name] report ?PVAB and atrial sensitivity adjusted.  ?BiV% increased 74% -> 83%. Hovering around 80% ?Will update echo to see if further adjustments necessary or if EF maintaining.  ? ?2. Prior VF arrest ?No ICD shocks ?  ?3. Paroxysmal atrial fibrillation  ?Burden 4.1% on remote 4/27.  ?Programming changes have been made in attempt to better sense and hopefully  maintain better BiV pacing.  ?Continue Eliquis ?  ?4. Obesity ?Body mass index is 34.03 kg/m?.  ? ?Current medicines are reviewed at length with the patient today.   ? ?Disposition:   Follow up with Dr.

## 2022-02-25 NOTE — Progress Notes (Signed)
EPIC Encounter for ICM Monitoring ? ?Patient Name: [name] is a 81 y.o. male ?Date: 02/25/2022 ?Primary Care Physican: Orpah Melter, MD ?Electrophysiologist: [place] ?Bi-V Pacing: 82%            ?02/02/2022 Office Weight: 242 lbs ?  ?AT/AF Burden: 5.9% (taking Eliquis) ?  ?       Transmission reviewed.  ?  ?[place] thoracic impedance suggesting normal fluid levels.  ?  ?Prescribed: ?Furosemide 20 mg Take 2 tablets (40 mg total) by mouth daily in the AM.  Take 1 tablet (20 mg) as needed in the PM for swelling. (dosage decreased on 03/10/2021) ?Spironolactone 25 mg take 0.5 tablet once a day. ?Farxiga 10 mg take 1 tablet daily before breakfast ?  ?Labs: ?11/23/2021 Creatinine 1.42, BUN 23, potassium 4.1, Sodium 140. GFR 50 ?06/16/2021 Creatinine 1.26, BUN 24, Potassium 3.9, Sodium 140, GFR 58 ?A complete set of results can be found in Results Review. ?  ?Recommendations:  No changes. ?  ?Follow-up plan: ICM clinic phone appointment on 03/28/2022.   91 day device clinic remote transmission 05/17/2022.   ?  ?EP/Cardiology Office Visits:  03/02/2022 with [name], [place].     03/15/2022 with Dr Johney Frame. ?  ?Copy of ICM check sent to Dr. Rayann Heman.    ? ?3 month ICM trend: 02/21/2022. ? ? ? ?12-14 Month ICM trend:  ? ? ? ?[name], RN ?02/25/2022 ?1:18 PM ? ?

## 2022-03-02 ENCOUNTER — Encounter: Payer: Self-pay | Admitting: Student

## 2022-03-02 ENCOUNTER — Ambulatory Visit (INDEPENDENT_AMBULATORY_CARE_PROVIDER_SITE_OTHER): Payer: Medicare HMO | Admitting: Student

## 2022-03-02 VITALS — BP 110/64 | HR 75 | Ht 71.0 in | Wt 244.0 lb

## 2022-03-02 DIAGNOSIS — Z95810 Presence of automatic (implantable) cardiac defibrillator: Secondary | ICD-10-CM

## 2022-03-02 DIAGNOSIS — I5042 Chronic combined systolic (congestive) and diastolic (congestive) heart failure: Secondary | ICD-10-CM

## 2022-03-02 DIAGNOSIS — I493 Ventricular premature depolarization: Secondary | ICD-10-CM | POA: Diagnosis not present

## 2022-03-02 DIAGNOSIS — I480 Paroxysmal atrial fibrillation: Secondary | ICD-10-CM | POA: Diagnosis not present

## 2022-03-02 MED ORDER — METOPROLOL SUCCINATE ER 50 MG PO TB24: 75.0000 mg | ORAL_TABLET | Freq: Every day | ORAL | 3 refills | Status: AC

## 2022-03-02 NOTE — Patient Instructions (Signed)
Medication Instructions:  ?Your physician has recommended you make the following change in your medication:  ? ?INCREASE: Metoprolol to '75mg'$  daily at bedtime ? ?*If you need a refill on your cardiac medications before your next appointment, please call your pharmacy* ? ? ?Lab Work: ?None  ?If you have labs (blood work) drawn today and your tests are completely normal, you will receive your results only by: ?MyChart Message (if you have MyChart) OR ?A paper copy in the mail ?If you have any lab test that is abnormal or we need to change your treatment, we will call you to review the results. ? ? ?Testing/Procedures: ?Your physician has requested that you have an echocardiogram. Echocardiography is a painless test that uses sound waves to create images of your heart. It provides your doctor with information about the size and shape of your heart and how well your heart?s chambers and valves are working. This procedure takes approximately one hour. There are no restrictions for this procedure. ? ? ?Follow-Up: ?At [facility], you and your health needs are our priority.  As part of our continuing mission to provide you with exceptional heart care, we have created designated Provider Care Teams.  These Care Teams include your primary Cardiologist (physician) and Advanced Practice Providers (APPs -  Physician Assistants and Nurse Practitioners) who all work together to provide you with the care you need, when you need it. ? ? ?Your next appointment:   ?3 month(s) ? ?The format for your next appointment:   ?In Person ? ?Provider:   ?Lars Mage, MD{ ?

## 2022-03-03 NOTE — Progress Notes (Signed)
Remote ICD transmission.   

## 2022-03-05 ENCOUNTER — Other Ambulatory Visit (HOSPITAL_COMMUNITY): Payer: Self-pay | Admitting: Internal Medicine

## 2022-03-05 DIAGNOSIS — I480 Paroxysmal atrial fibrillation: Secondary | ICD-10-CM

## 2022-03-07 MED ORDER — APIXABAN 5 MG PO TABS: 5.0000 mg | ORAL_TABLET | Freq: Two times a day (BID) | ORAL | 5 refills | Status: DC

## 2022-03-07 MED ORDER — APIXABAN 5 MG PO TABS: 5.0000 mg | ORAL_TABLET | Freq: Two times a day (BID) | ORAL | 5 refills | Status: AC

## 2022-03-07 NOTE — Addendum Note (Signed)
Addended by: Leonidas Romberg on: 03/07/2022 08:33 AM ? ? Modules accepted: Orders ? ?

## 2022-03-07 NOTE — Progress Notes (Signed)
Cardiology Office Note:    Date:  03/15/2022   ID:  [name], DOB 10-Dec-1940, MRN [id number]  PCP:  Marcus Melter, MD   [facility] Providers Cardiologist:  Marcus Dawley, MD Electrophysiologist:  Marcus Grayer, MD  {   Referring MD: Marcus Melter, MD    History of Present Illness:    [name] is a 81 y.o. male with a hx of mixed cardiomyopathy/chronic systolic CHF, [employer] BiV ICD implantation 08/2015, CAD s/p DES to LCx 2016, left bundle branch block, PVCs, VF (ICD therapy 2018), PVCs, PAF, obesity, hypertension, hyperlipidemia, bilateral lymphedema s/p treatment, Merkel cell cancer status post chemo/radiation, acid reflux, mild chronic appearing thrombocytopenia, mild dilation of ascending aorta who presents for follow-up.    The patient initially established care with [name] in 2016 for shortness of breath. Stress test showed no ischemia but TTE had shown LV dysfunction EF 30 to 35%. He underwent cardiac catheterization with staged PCI to the circumflex 03/2015. [name] felt that his CAD could be contributing to his cardiomyopathy though was not the sole cause. cMRI showed diffuse HK, mild MR, "and mid inferolateral wall and partially of the basal and mid inferior wall associated with 75-100% late gadolinium enhancement consistent with prior myocardial infarction and no change of recovery if revascularized." Despite PCI and medication optimization, his LV dysfunction persisted so he has had a BiV ICD implanted. He also has a history of VF/PVCs and afib followed by [name]. He was subsequently started on Eliquis.   Was last seen in 02/2021 where he was having SOB. BNP was stable from prior at 689. He was started on farxiga at that time.  Today, the patient overall feels good. Continues to have shortness of breath with moderate or more exertion. Has chronic RLE lymphedema but this is unchanged. No chest pain, lightheadedness, dizziness or syncope. He  exercises on recumbent bike for 61mn 2x/week but is trying to increase his exercise. Tolerating medications without issues. No bleeding on apixaban.   Past Medical History:  Diagnosis Date   Acid reflux    Biventricular ICD (implantable cardioverter-defibrillator) in place    CAD (coronary artery disease)    a. PCI to Circ with DES on 04/06/15.   Cardiomyopathy ([place]    a. mixed picture - suspected NICM but cannot exclude contribution from CAD.   Chronic systolic CHF (congestive heart failure) (HCC)    Degenerative disc disease, thoracic    Essential hypertension    Hyperlipidemia    Hyperplastic colon polyp    LBBB (left bundle branch block)    Merkel cell cancer ([facility] 05/2005; 07-2005   s/p gluteal resection, lymph node dissection, chemo/ radiation   Mild dilation of ascending aorta (HCC)    Nonischemic cardiomyopathy ([facility]    a. s/p STJ CRTD 07/2015   Obesity    PAF (paroxysmal atrial fibrillation) (HCC)    Paroxysmal atrial fibrillation ([facility] [place]    a. identified on device interrogation. All episodes <3 hours   PVC's (premature ventricular contractions)    Ventricular fibrillation ([place]    a. appropriate ICD therapy 02/2017    Past Surgical History:  Procedure Laterality Date   CARDIAC CATHETERIZATION N/A 04/06/2015   Procedure: Right/Left Heart Cath and Coronary Angiography;  Surgeon: DLarey Dresser MD;  [facility];  Service: Cardiovascular;  Laterality: N/A;   CARDIAC CATHETERIZATION N/A 04/06/2015   Procedure: Coronary Stent Intervention;  Surgeon: Marcus M JMartinique MD;  [facility];  Service: Cardiovascular;  Laterality: N/A;   CATARACT EXTRACTION W/ INTRAOCULAR LENS  IMPLANT, BILATERAL Bilateral 11/2014   COLONOSCOPY  2009   EP IMPLANTABLE DEVICE N/A 08/04/2015   STJ CRTD implanted by Dr [name] for primary prevention/CHF   KNEE CARTILAGE SURGERY Left 1960   SKIN CANCER EXCISION  05/2005; 07/2005   excision of merkel cell   TONSILLECTOMY       Current Medications: Current Meds  Medication Sig   acetaminophen (TYLENOL) 325 MG tablet Take 650 mg by mouth every 6 (six) hours as needed for mild pain or moderate pain.   apixaban (ELIQUIS) 5 MG TABS tablet Take 1 tablet (5 mg total) by mouth 2 (two) times daily.   atorvastatin (LIPITOR) 80 MG tablet Take 1 tablet (80 mg total) by mouth daily. Please keep upcoming appt in October with [name] for future refills. Thank you   COVID-19 mRNA Vac-TriS, Pfizer, SUSP injection Inject into the muscle.   dapagliflozin propanediol (FARXIGA) 10 MG TABS tablet Take 1 tablet (10 mg total) by mouth daily before breakfast.   fluorouracil (EFUDEX) 5 % cream Apply 5 application topically daily as needed (Patches on face).    furosemide (LASIX) 20 MG tablet Take 2 tablets (40 mg total) by mouth daily in the AM.  Take 1 tablet (20 mg) as needed in the PM for swelling.   levothyroxine (SYNTHROID, LEVOTHROID) 50 MCG tablet Take 50 mcg by mouth daily.   metoprolol succinate (TOPROL-XL) 50 MG 24 hr tablet Take 1.5 tablets (75 mg total) by mouth at bedtime. Take with or immediately following a meal.   POTASSIUM PO Take 550 mg by mouth daily.   sacubitril-valsartan (ENTRESTO) 24-26 MG Take 1 tablet by mouth 2 (two) times daily.   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  2 grown sons have HTN.   Works as a Higher education careers adviser business)   Social Determinants of Radio broadcast assistant Strain: Not on [employer] Insecurity: Not on file  Transportation Needs: Not on file  Physical Activity: Not on file  Stress: Not on file  Social Connections: Not on file     Family History: The patient's family history includes Cancer in his father and mother; Lupus in his mother; Stroke in his sister; Thyroid disease in his sister. There is  no history of Heart attack or Hypertension.  ROS:   Please see the history of present illness.    Review of Systems  Constitutional:  Negative for chills, fever and malaise/fatigue.  HENT:  Negative for ear pain, nosebleeds and sore throat.   Eyes:  Negative for blurred vision, discharge and redness.  Respiratory:  Positive for shortness of breath. Negative for cough and hemoptysis.   Cardiovascular:  Positive for leg swelling. Negative for chest pain, palpitations, orthopnea, claudication and PND.  Gastrointestinal:  Negative for blood in stool, diarrhea, nausea and vomiting.  Genitourinary:  Negative for flank pain and hematuria.  Musculoskeletal:  Negative for falls, joint pain and myalgias.  Neurological:  Negative for dizziness, loss of consciousness and headaches.  Endo/Heme/Allergies:  Does not bruise/bleed easily.  Psychiatric/Behavioral:  Negative for depression. The patient is not nervous/anxious and does not have insomnia.     EKGs/Labs/Other Studies Reviewed:    The following studies were reviewed today:  Lexiscan Myoview 09/15/2020: Nuclear stress EF: 29%. There was no ST segment deviation noted  during stress. The left ventricular ejection fraction is severely decreased (<30%). Defect 1: There is a large defect of severe severity present in the basal inferoseptal, basal inferior, basal inferolateral, mid inferolateral and apical inferior location. Findings consistent with prior myocardial infarction with peri-infarct ischemia. This is a high risk study.   Abnormal study, high risk due to EF and area size of abnormality. Large, severe defect seen at basal inferior/inferolateral/inferoseptal segments as well as mid inferolateral and apical inferior. This is largely fixed with a small amount of reversibility. There is also wall motion abnormality in this area. This is concerning for infarct with peri-infarct ischemia.   Echo 09/15/2020: 1. Left ventricular ejection fraction, by estimation, is 40 to 45%, very  difficult to assess due to image quality, abnormal septal motion and  frequent ectopy. The left ventricle has mildly decreased function. Left  ventricular endocardial border not  optimally defined to evaluate regional wall motion. There is mild left  ventricular hypertrophy. Left ventricular diastolic parameters are  indeterminate.   2. Right ventricular systolic function is normal. The right ventricular  size is normal. Tricuspid regurgitation signal is inadequate for assessing  PA pressure.   3. Left atrial size was moderately dilated.   4. The mitral valve is grossly normal. Trivial mitral valve  regurgitation. No evidence of mitral stenosis.   5. The aortic valve was not well visualized. Aortic valve regurgitation  is not visualized. No aortic stenosis is present.   6. Aortic dilatation noted. There is mild dilatation of the ascending  aorta, measuring 40 mm.   7. The inferior vena cava is normal in size with greater than 50%  respiratory variability, suggesting right atrial pressure of 3 mmHg.  EKG:   No new ECG  Recent Labs: 11/23/2021: BUN 23; Creatinine, Ser 1.42;  Hemoglobin 14.0; Platelets 102; Potassium 4.1; Sodium 140  Recent Lipid Panel    Component Value Date/Time   CHOL 97 (L) 06/03/2021 0923   TRIG 49 06/03/2021 0923   HDL 39 (L) 06/03/2021 0923   CHOLHDL 2.5 06/03/2021 0923   CHOLHDL 1.9 05/04/2016 1001   VLDL 10 05/04/2016 1001   LDLCALC 46 06/03/2021 0923     Risk Assessment/Calculations:    CHA2DS2-VASc Score = 5  This indicates a 7.2% annual risk of stroke. The patient's score is based upon: CHF History: 1 HTN History: 1 Diabetes History: 0 Stroke History: 0 Vascular Disease History: 1 Age Score:  2 Gender Score: 0      Physical Exam:    VS:  BP 106/68   Pulse 92   Ht '5\' 11"'$  (1.803 m)   Wt 241 lb (109.3 kg)   SpO2 97%   BMI 33.61 kg/m     Wt Readings from Last 3 Encounters:  03/15/22 241 lb (109.3 kg)  03/02/22 244 lb (110.7 kg)  02/02/22 242 lb (109.8 kg)     GEN: Elderly male, NAD HEENT: Normal NECK: No JVD; No carotid bruits CARDIAC: RRR, no murmurs, rubs, gallops RESPIRATORY:  Clear to auscultation without rales, wheezing or rhonchi  ABDOMEN: Soft, non-tender, non-distended MUSCULOSKELETAL:  1+ right LE lymphedema (chronic). No LLE edema. Warm. SKIN: Warm and dry NEUROLOGIC:  Nonfocal PSYCHIATRIC:  Normal affect   ASSESSMENT:    1. Chronic combined systolic and diastolic CHF (congestive heart failure) ([place])   2. Biventricular ICD (implantable cardioverter-defibrillator) in place   3. PAF (paroxysmal atrial fibrillation) ([place])   4. PVC's (premature ventricular contractions)   5. Essential hypertension   6. CAD in native artery   7. Ischemic cardiomyopathy   8. Pure hypercholesterolemia   9. Primary hypertension   10. Secondary hypercoagulable state ([place])     PLAN:    In order of problems listed above:  #Chronic Combined Systolic and Diastolic Heart Failure: TTE with LVEF 40-45% on TTE 09/15/21. ICD in place. Currently compensated with NYHA class II symptoms. Has chronic dyspnea on  exertion that has been ongoing and is unchanged. Not progressing. Myoview with large inferior/inferoseptal/inferolateral with minimal peri-infarct ischemia.  -Planned for repeat TTE for monitoring -Continue metoprolol '75mg'$  XL daily -Continue entresto 24-'26mg'$  BID -Continue spiro 12.'5mg'$  daily -Change lasix to '40mg'$  qAM; '20mg'$  qPM  -Continue farxiga '10mg'$  daily -Low Na diet -Monitor daily weights  #Chronic DOE: Has been ongoing. Myoview with large inferior/inferoseptal/inferolateral infarct without significant peri-infarct ischemia. TTE with LVEF 40-45%. Suspect symptoms may be related to deconditioning as appears overall euvolemic on exam.  -Encouarge graduated exercise and weight loss -Follow-up repeat TTE  #Paroxysmal Afib: CHADs-vasc 5. Currently regular rhythm on exam. Tolerating AC without issues. -Continue metoprolol '75mg'$  XL daily -Continue apixaban '5mg'$  BID -Follow-up with EP as scheduled  #History of VT and PVCs: Previously followed by [name]. Now transitioning to Dr. Quentin Ore. -Continue metoprolol '75mg'$  XL daily  #Coronary Artery Disease: S/p DES to LCx in 2016. Myoview in 08/2020 withlarge inferior/inferoseptal/inferolateral without significant peri-infarct ischemia.  -Continue atorvastatin '80mg'$  daily -Myoview with infarct without significant ischemia  #HLD: Monitored by PCP -Continue lipitor '80mg'$  daily  #HTN: Controlled. -Continue metoprolol '75mg'$  XL daily -Continue entresto 24-'26mg'$  BID  #Ascending aorta dilation: Measures 89m on TTE 08/2020. -Follow-up TTE scheduled this week -Blood pressure control as above     Medication Adjustments/Labs and Tests Ordered: Current medicines are reviewed at length with the patient today.  Concerns regarding medicines are outlined above.  No orders of the defined types were placed in this encounter.  No orders of the defined types were placed in this encounter.   Patient Instructions  Medication Instructions:   Your  physician recommends that you continue on your current medications as directed. Please refer to the Current Medication list given to you today.  *If you need a refill on your cardiac medications before your next appointment, please call your pharmacy*   Follow-Up: [facility] you and your health needs are our priority.  As part of our continuing mission to provide you with exceptional heart care, we have created  designated Provider Care Teams.  These Care Teams include your primary Cardiologist (physician) and Advanced Practice Providers (APPs -  Physician Assistants and Nurse Practitioners) who all work together to provide you with the care you need, when you need it.  We recommend signing up for the patient portal called "MyChart".  Sign up information is provided on this After Visit Summary.  MyChart is used to connect with patients for Virtual Visits (Telemedicine).  Patients are able to view lab/test results, encounter notes, upcoming appointments, etc.  Non-urgent messages can be sent to your provider as well.   To learn more about what you can do with MyChart, go to [email or web address].    Your next appointment:   6 month(s)  The format for your next appointment:   In Person  Provider:   Dr. Johney Frame   Important Information About Sugar          Signed, Freada Bergeron, MD  03/15/2022 9:26 AM    [place]

## 2022-03-07 NOTE — Addendum Note (Signed)
Addended by: Leonidas Romberg on: 03/07/2022 08:28 AM ? ? Modules accepted: Orders ? ?

## 2022-03-07 NOTE — Telephone Encounter (Signed)
Prescription refill request for Eliquis received. ?Indication:  ?Last office visit: 03/02/22 Chalmers Cater)  ?Scr: 1.42 (11/23/21) ?Age: 81 ?Weight: 110.7kg ? ?Appropriate dose and refill sent to requested pharmacy.  ?

## 2022-03-15 ENCOUNTER — Encounter: Payer: Self-pay | Admitting: Cardiology

## 2022-03-15 ENCOUNTER — Ambulatory Visit: Payer: Medicare HMO | Admitting: Cardiology

## 2022-03-15 VITALS — BP 106/68 | HR 92 | Ht 71.0 in | Wt 241.0 lb

## 2022-03-15 DIAGNOSIS — E7800 Pure hypercholesterolemia, unspecified: Secondary | ICD-10-CM

## 2022-03-15 DIAGNOSIS — I5042 Chronic combined systolic (congestive) and diastolic (congestive) heart failure: Secondary | ICD-10-CM | POA: Diagnosis not present

## 2022-03-15 DIAGNOSIS — I480 Paroxysmal atrial fibrillation: Secondary | ICD-10-CM

## 2022-03-15 DIAGNOSIS — I493 Ventricular premature depolarization: Secondary | ICD-10-CM | POA: Diagnosis not present

## 2022-03-15 DIAGNOSIS — I255 Ischemic cardiomyopathy: Secondary | ICD-10-CM

## 2022-03-15 DIAGNOSIS — I2510 Atherosclerotic heart disease of native coronary artery without angina pectoris: Secondary | ICD-10-CM

## 2022-03-15 DIAGNOSIS — D6869 Other thrombophilia: Secondary | ICD-10-CM

## 2022-03-15 DIAGNOSIS — Z95810 Presence of automatic (implantable) cardiac defibrillator: Secondary | ICD-10-CM | POA: Diagnosis not present

## 2022-03-15 DIAGNOSIS — I10 Essential (primary) hypertension: Secondary | ICD-10-CM | POA: Diagnosis not present

## 2022-03-15 NOTE — Patient Instructions (Signed)

## 2022-03-17 ENCOUNTER — Ambulatory Visit (HOSPITAL_COMMUNITY): Payer: Medicare HMO | Attending: Cardiovascular Disease

## 2022-03-17 DIAGNOSIS — I5042 Chronic combined systolic (congestive) and diastolic (congestive) heart failure: Secondary | ICD-10-CM

## 2022-03-17 LAB — ECHOCARDIOGRAM COMPLETE
Area-P 1/2: 2.59 cm2
Calc EF: 40.5 %
MV M vel: 4.26 m/s
MV Peak grad: 72.6 mmHg
S' Lateral: 4.6 cm
Single Plane A2C EF: 42.2 %
Single Plane A4C EF: 38.4 %

## 2022-03-28 ENCOUNTER — Ambulatory Visit (INDEPENDENT_AMBULATORY_CARE_PROVIDER_SITE_OTHER): Payer: Medicare HMO

## 2022-03-28 ENCOUNTER — Telehealth: Payer: Self-pay

## 2022-03-28 DIAGNOSIS — Z95810 Presence of automatic (implantable) cardiac defibrillator: Secondary | ICD-10-CM | POA: Diagnosis not present

## 2022-03-28 DIAGNOSIS — I5042 Chronic combined systolic (congestive) and diastolic (congestive) heart failure: Secondary | ICD-10-CM

## 2022-03-28 NOTE — Telephone Encounter (Signed)
Remote ICM transmission received.  Attempted call to patient regarding ICM remote transmission and left detailed message per DPR.  Advised to return call for any fluid symptoms or questions. Next ICM remote transmission scheduled 04/04/2022.

## 2022-03-28 NOTE — Progress Notes (Signed)
EPIC Encounter for ICM Monitoring  Patient Name: [name] is a 81 y.o. male Date: 03/28/2022 Primary Care Physican: Orpah Melter, MD Primary Cardiologist: Johney Frame Electrophysiologist: Allred Bi-V Pacing: 82%            03/15/2022 Office Weight: 241 lbs   AT/AF Burden: 7.1% (taking Eliquis)          Attempted call to patient and unable to reach.  Left detailed message per DPR regarding transmission. Transmission reviewed.    Corvue thoracic impedance suggesting possible fluid accumulation starting 6/4.    Prescribed: Furosemide 20 mg Take 2 tablets (40 mg total) by mouth daily in the AM.  Take 1 tablet (20 mg) as needed in the PM for swelling.  Spironolactone 25 mg take 0.5 tablet once a day. Farxiga 10 mg take 1 tablet daily before breakfast   Labs: 11/23/2021 Creatinine 1.42, BUN 23, potassium 4.1, Sodium 140. GFR 50 06/16/2021 Creatinine 1.26, BUN 24, Potassium 3.9, Sodium 140, GFR 58 A complete set of results can be found in Results Review.   Recommendations:  Left voice mail with ICM number and encouraged to call if experiencing any fluid symptoms.   Follow-up plan: ICM clinic phone appointment on 04/04/2022 to recheck fluid levels.   91 day device clinic remote transmission 05/17/2022.     EP/Cardiology Office Visits:  05/30/2022 with Dr Quentin Ore.     09/12/2022 with Dr Johney Frame.   Copy of ICM check sent to Dr. Rayann Heman.   Will send copy to Dr Johney Frame for review if patient is reached.    3 month ICM trend: 03/28/2022.    12-14 Month ICM trend:     [name], RN 03/28/2022 4:05 PM

## 2022-03-30 DIAGNOSIS — D1801 Hemangioma of skin and subcutaneous tissue: Secondary | ICD-10-CM | POA: Diagnosis not present

## 2022-03-30 DIAGNOSIS — Z129 Encounter for screening for malignant neoplasm, site unspecified: Secondary | ICD-10-CM | POA: Diagnosis not present

## 2022-03-30 DIAGNOSIS — L821 Other seborrheic keratosis: Secondary | ICD-10-CM | POA: Diagnosis not present

## 2022-03-30 DIAGNOSIS — D235 Other benign neoplasm of skin of trunk: Secondary | ICD-10-CM | POA: Diagnosis not present

## 2022-03-30 DIAGNOSIS — Z85828 Personal history of other malignant neoplasm of skin: Secondary | ICD-10-CM | POA: Diagnosis not present

## 2022-03-30 DIAGNOSIS — C44519 Basal cell carcinoma of skin of other part of trunk: Secondary | ICD-10-CM | POA: Diagnosis not present

## 2022-04-08 NOTE — Progress Notes (Signed)
No ICM remote transmission received for 04/04/2022 to recheck fluid levels and next ICM transmission scheduled for 04/18/2022.

## 2022-04-21 ENCOUNTER — Telehealth: Payer: Self-pay

## 2022-04-21 NOTE — Telephone Encounter (Signed)
LMOVM about missed ICM transmission.

## 2022-04-21 NOTE — Telephone Encounter (Signed)
PATIENT'S MEDS WERE SHIPPED FROM [facility]/ ID# [id number]/WTGRM# [phone]/TRACKING#[id number]

## 2022-04-22 ENCOUNTER — Encounter (HOSPITAL_BASED_OUTPATIENT_CLINIC_OR_DEPARTMENT_OTHER): Payer: Self-pay | Admitting: Emergency Medicine

## 2022-04-22 ENCOUNTER — Other Ambulatory Visit: Payer: Self-pay

## 2022-04-22 ENCOUNTER — Other Ambulatory Visit (HOSPITAL_BASED_OUTPATIENT_CLINIC_OR_DEPARTMENT_OTHER): Payer: Self-pay

## 2022-04-22 ENCOUNTER — Emergency Department (HOSPITAL_BASED_OUTPATIENT_CLINIC_OR_DEPARTMENT_OTHER)
Admission: EM | Admit: 2022-04-22 | Discharge: 2022-04-22 | Disposition: A | Discharge status: Home / Self Care | Payer: Medicare HMO | Attending: Emergency Medicine | Admitting: Emergency Medicine

## 2022-04-22 DIAGNOSIS — Z7901 Long term (current) use of anticoagulants: Secondary | ICD-10-CM | POA: Insufficient documentation

## 2022-04-22 DIAGNOSIS — I509 Heart failure, unspecified: Secondary | ICD-10-CM | POA: Diagnosis not present

## 2022-04-22 DIAGNOSIS — M533 Sacrococcygeal disorders, not elsewhere classified: Secondary | ICD-10-CM | POA: Insufficient documentation

## 2022-04-22 DIAGNOSIS — I2510 Atherosclerotic heart disease of native coronary artery without angina pectoris: Secondary | ICD-10-CM | POA: Diagnosis not present

## 2022-04-22 DIAGNOSIS — M5459 Other low back pain: Secondary | ICD-10-CM | POA: Diagnosis not present

## 2022-04-22 DIAGNOSIS — M545 Low back pain, unspecified: Secondary | ICD-10-CM | POA: Diagnosis present

## 2022-04-22 MED ORDER — METHYLPREDNISOLONE 4 MG PO TBPK
ORAL_TABLET | ORAL | 0 refills | Status: DC
  Filled 2022-04-22: qty 21, 6d supply, fill #0

## 2022-04-22 MED ORDER — TIZANIDINE HCL 2 MG PO TABS
2.0000 mg | ORAL_TABLET | Freq: Two times a day (BID) | ORAL | 0 refills | Status: DC | PRN
  Filled 2022-04-22: qty 15, 8d supply, fill #0

## 2022-04-22 MED ORDER — OXYCODONE HCL 5 MG PO TABS
5.0000 mg | ORAL_TABLET | Freq: Once | ORAL | Status: AC
  Administered 2022-04-22: 5 mg via ORAL
  Filled 2022-04-22: qty 1

## 2022-04-22 NOTE — ED Triage Notes (Signed)
Patient presents to ED via POV from home. Patient reports left lower back pain. Denies recent injury or trauma. Denies any urinary symptoms.

## 2022-04-22 NOTE — Progress Notes (Signed)
No ICM remote transmission received for 04/18/2022 and next ICM transmission scheduled for 05/23/2022.

## 2022-04-22 NOTE — ED Provider Notes (Signed)
[facility] EMERGENCY DEPARTMENT Provider Note   CSN: [id number] Arrival date & time: 04/22/22  1025     History  Chief Complaint  Patient presents with   Back Pain    [name] is a 81 y.o. male.  Patient here with left lower back pain.  Started hurting last night.  Similar to processes in the back.  No abdominal pain or chest pain or shortness of breath.  He states leaning over to the right helps make the pain completely go away.  He has no loss of bowel or bladder.  No urinary symptoms.  No blood in his urine.  No history of kidney stones.  History of CAD.  Denies any pain in his legs.  Pain is focally in the left lower back.  Reproducible to touch.  Denies any trauma.  Denies any weakness, numbness, fever or chills.  The history is provided by the patient.       Home Medications Prior to Admission medications   Medication Sig Start Date End Date Taking? Authorizing Provider  methylPREDNISolone (MEDROL DOSEPAK) 4 MG TBPK tablet Follow package insert 04/22/22  Yes Elinore Shults, DO  tiZANidine (ZANAFLEX) 2 MG tablet Take 1 tablet (2 mg total) by mouth every 12 (twelve) hours as needed for up to 15 doses for muscle spasms. 04/22/22  Yes Treyden Hakim, DO  acetaminophen (TYLENOL) 325 MG tablet Take 650 mg by mouth every 6 (six) hours as needed for mild pain or moderate pain.    [provider]  apixaban (ELIQUIS) 5 MG TABS tablet Take 1 tablet (5 mg total) by mouth 2 (two) times daily. 03/07/22   Allred, Jeneen Rinks, MD  atorvastatin (LIPITOR) 80 MG tablet Take 1 tablet (80 mg total) by mouth daily. Please keep upcoming appt in October with Dr. Meda Coffee for future refills. Thank you 05/07/18   Dorothy Spark, MD  COVID-19 mRNA Vac-TriS, Pfizer, SUSP injection Inject into the muscle. 05/18/21   Carlyle Basques, MD  dapagliflozin propanediol (FARXIGA) 10 MG TABS tablet Take 1 tablet (10 mg total) by mouth daily before breakfast. 01/14/22   Freada Bergeron, MD   fluorouracil (EFUDEX) 5 % cream Apply 5 application topically daily as needed (Patches on face).  10/15/14   [provider]  furosemide (LASIX) 20 MG tablet Take 2 tablets (40 mg total) by mouth daily in the AM.  Take 1 tablet (20 mg) as needed in the PM for swelling. 03/26/21   Freada Bergeron, MD  levothyroxine (SYNTHROID, LEVOTHROID) 50 MCG tablet Take 50 mcg by mouth daily. 02/06/15   [provider]  metoprolol succinate (TOPROL-XL) 50 MG 24 hr tablet Take 1.5 tablets (75 mg total) by mouth at bedtime. Take with or immediately following a meal. 03/02/22   Tillery, Satira Mccallum, PA-C  POTASSIUM PO Take 550 mg by mouth daily.    [provider]  sacubitril-valsartan (ENTRESTO) 24-26 MG Take 1 tablet by mouth 2 (two) times daily. 02/18/22   Freada Bergeron, MD  spironolactone (ALDACTONE) 25 MG tablet TAKE 1/2 TABLET BY MOUTH EVERY DAY 08/27/21   Allred, Jeneen Rinks, MD      Allergies    Wound dressing adhesive and Tape    Review of Systems   Review of Systems  Physical Exam Updated Vital Signs BP (!) 124/91   Pulse 90   Temp 97.8 F (36.6 C) (Oral)   Resp 17   SpO2 97%  Physical Exam Vitals and nursing note reviewed.  Constitutional:  General: He is not in acute distress.    Appearance: He is well-developed. He is not ill-appearing.  HENT:     Head: Normocephalic and atraumatic.     Nose: Nose normal.     Mouth/Throat:     Mouth: Mucous membranes are moist.  Eyes:     Extraocular Movements: Extraocular movements intact.     Conjunctiva/sclera: Conjunctivae normal.     Pupils: Pupils are equal, round, and reactive to light.  Cardiovascular:     Rate and Rhythm: Normal rate and regular rhythm.     Pulses: Normal pulses.     Heart sounds: No murmur heard. Pulmonary:     Effort: Pulmonary effort is normal. No respiratory distress.     Breath sounds: Normal breath sounds.  Abdominal:     Palpations: Abdomen is soft.     Tenderness: There is  no abdominal tenderness.  Musculoskeletal:        General: Tenderness present. No swelling.     Cervical back: Normal range of motion and neck supple.     Comments: No midline spinal tenderness, tenderness around the SI joint on the left as well as paraspinal lumbar muscles on the left  Skin:    General: Skin is warm and dry.     Capillary Refill: Capillary refill takes less than 2 seconds.  Neurological:     General: No focal deficit present.     Mental Status: He is alert.     Comments: 5+ out of 5 strength throughout, normal sensation, no drift, normal finger-nose-finger, normal speech  Psychiatric:        Mood and Affect: Mood normal.     ED Results / Procedures / Treatments   Labs (all labs ordered are listed, but only abnormal results are displayed) Labs Reviewed - No data to display  EKG None  Radiology No results found.  Procedures Procedures    Medications Ordered in ED Medications  oxyCODONE (Oxy IR/ROXICODONE) immediate release tablet 5 mg (5 mg Oral Given 04/22/22 1135)    ED Course/ Medical Decision Making/ A&P                           Medical Decision Making Risk Prescription drug management.   CHARLE CLEAR is here back pain history history of A-fib on Eliquis, high cholesterol, CHF, cardiomyopathy.  Here with left lower back pain.  Differential diagnosis is muscle spasm versus SI joint dysfunction.  I have no concern for fracture, AAA, kidney stone, urine infection or other intra-abdominal process.  He has no symptoms to suggest cauda equina.  He is fairly focal and reproducible left lower back pain in the left SI joint area and the left lower paraspinal lumbar muscles.  He has normal pulses in his lower extremities.  I am not concerned about DVT or peripheral arterial disease.  Patient was given dose of Roxicodone in the ED.  Will prescribe Medrol Dosepak, Zanaflex.  Recommend Tylenol follow-up with primary care doctor.  Discharged in good condition.   Understands return precautions.  This chart was dictated using voice recognition software.  Despite best efforts to proofread,  errors can occur which can change the documentation meaning.         Final Clinical Impression(s) / ED Diagnoses Final diagnoses:  Acute left-sided low back pain without sciatica    Rx / DC Orders ED Discharge Orders          Ordered  methylPREDNISolone (MEDROL DOSEPAK) 4 MG TBPK tablet        04/22/22 1138    tiZANidine (ZANAFLEX) 2 MG tablet  Every 12 hours PRN        04/22/22 1138              Bryana Froemming, [place], DO 04/22/22 1141

## 2022-04-22 NOTE — Discharge Instructions (Signed)
In addition to taking steroids and muscle relaxant.  Recommend taking 1000 mg of Tylenol every 6 hours as needed for pain as well.  Please be careful while taking muscle relaxant as it is sedating.  Do not use with alcohol or drugs or dangerous activities including driving.  Follow-up with your primary care doctor.

## 2022-04-25 DIAGNOSIS — M5136 Other intervertebral disc degeneration, lumbar region: Secondary | ICD-10-CM | POA: Diagnosis not present

## 2022-04-25 DIAGNOSIS — Z79899 Other long term (current) drug therapy: Secondary | ICD-10-CM | POA: Diagnosis not present

## 2022-04-25 DIAGNOSIS — M79652 Pain in left thigh: Secondary | ICD-10-CM | POA: Diagnosis not present

## 2022-04-25 DIAGNOSIS — Z950 Presence of cardiac pacemaker: Secondary | ICD-10-CM | POA: Diagnosis not present

## 2022-04-25 DIAGNOSIS — M5442 Lumbago with sciatica, left side: Secondary | ICD-10-CM | POA: Diagnosis not present

## 2022-04-25 DIAGNOSIS — M5416 Radiculopathy, lumbar region: Secondary | ICD-10-CM | POA: Diagnosis not present

## 2022-04-25 DIAGNOSIS — R82998 Other abnormal findings in urine: Secondary | ICD-10-CM | POA: Diagnosis not present

## 2022-04-25 DIAGNOSIS — M545 Low back pain, unspecified: Secondary | ICD-10-CM | POA: Diagnosis not present

## 2022-04-25 DIAGNOSIS — M5432 Sciatica, left side: Secondary | ICD-10-CM | POA: Diagnosis not present

## 2022-04-25 DIAGNOSIS — M2578 Osteophyte, vertebrae: Secondary | ICD-10-CM | POA: Diagnosis not present

## 2022-04-29 DIAGNOSIS — Z742 Need for assistance at home and no other household member able to render care: Secondary | ICD-10-CM | POA: Diagnosis not present

## 2022-04-29 DIAGNOSIS — Z9989 Dependence on other enabling machines and devices: Secondary | ICD-10-CM | POA: Diagnosis not present

## 2022-04-29 DIAGNOSIS — Z111 Encounter for screening for respiratory tuberculosis: Secondary | ICD-10-CM | POA: Diagnosis not present

## 2022-04-29 DIAGNOSIS — M5442 Lumbago with sciatica, left side: Secondary | ICD-10-CM | POA: Diagnosis not present

## 2022-05-17 ENCOUNTER — Ambulatory Visit (INDEPENDENT_AMBULATORY_CARE_PROVIDER_SITE_OTHER): Payer: Medicare HMO

## 2022-05-17 DIAGNOSIS — I255 Ischemic cardiomyopathy: Secondary | ICD-10-CM

## 2022-05-18 LAB — CUP PACEART REMOTE DEVICE CHECK
Battery Remaining Longevity: 20 mo
Battery Remaining Percentage: 25 %
Battery Voltage: 2.81 V
Brady Statistic AP VP Percent: 12 %
Brady Statistic AP VS Percent: 1 %
Brady Statistic AS VP Percent: 76 %
Brady Statistic AS VS Percent: 5.6 %
Brady Statistic RA Percent Paced: 6.2 %
Date Time Interrogation Session: 20230726105208
HighPow Impedance: 69 Ohm
HighPow Impedance: 69 Ohm
Implantable Lead Implant Date: 20161011
Implantable Lead Implant Date: 20161011
Implantable Lead Implant Date: 20161011
Implantable Lead Location: 753858
Implantable Lead Location: 753859
Implantable Lead Location: 753860
Implantable Pulse Generator Implant Date: 20161011
Lead Channel Impedance Value: 380 Ohm
Lead Channel Impedance Value: 390 Ohm
Lead Channel Impedance Value: 540 Ohm
Lead Channel Pacing Threshold Amplitude: 0.75 V
Lead Channel Pacing Threshold Amplitude: 0.875 V
Lead Channel Pacing Threshold Amplitude: 1 V
Lead Channel Pacing Threshold Pulse Width: 0.5 ms
Lead Channel Pacing Threshold Pulse Width: 0.5 ms
Lead Channel Pacing Threshold Pulse Width: 0.8 ms
Lead Channel Sensing Intrinsic Amplitude: 11.9 mV
Lead Channel Sensing Intrinsic Amplitude: 3.1 mV
Lead Channel Setting Pacing Amplitude: 2 V
Lead Channel Setting Pacing Amplitude: 2 V
Lead Channel Setting Pacing Amplitude: 2 V
Lead Channel Setting Pacing Pulse Width: 0.5 ms
Lead Channel Setting Pacing Pulse Width: 0.8 ms
Lead Channel Setting Sensing Sensitivity: 0.5 mV
Pulse Gen Serial Number: 7276664

## 2022-05-20 DIAGNOSIS — M545 Low back pain, unspecified: Secondary | ICD-10-CM | POA: Diagnosis not present

## 2022-05-23 ENCOUNTER — Telehealth: Payer: Self-pay

## 2022-05-23 ENCOUNTER — Ambulatory Visit (INDEPENDENT_AMBULATORY_CARE_PROVIDER_SITE_OTHER): Payer: Medicare HMO

## 2022-05-23 DIAGNOSIS — Z95810 Presence of automatic (implantable) cardiac defibrillator: Secondary | ICD-10-CM | POA: Diagnosis not present

## 2022-05-23 DIAGNOSIS — I5042 Chronic combined systolic (congestive) and diastolic (congestive) heart failure: Secondary | ICD-10-CM | POA: Diagnosis not present

## 2022-05-23 NOTE — Telephone Encounter (Signed)
**Note De-Identified [place]alce Obfuscation** Providers page of a BMSPAF application for Eliquis received from Lelon Huh, CPhT with [facility] with a request for [place] to complete and to fax back to her at [phone].  I have completed the providers page and have e-mailed it to Dr Jacolyn Reedy nurse so she can obtain her signature and to fax back to [place] at the fax number written on the cover letter included.

## 2022-05-23 NOTE — Telephone Encounter (Signed)
Application signed by Dr. Johney Frame this morning.  Faxed all forms to contact information provided on cover sheet.  Confirmation fax received. Will route this message to Prior Auth Nurse, to make her aware of completion.

## 2022-05-25 ENCOUNTER — Other Ambulatory Visit: Payer: Self-pay | Admitting: Cardiology

## 2022-05-26 NOTE — Progress Notes (Signed)
EPIC Encounter for ICM Monitoring  Patient Name: [name] is a 81 y.o. male Date: 05/26/2022 Primary Care Physican: Orpah Melter, MD Primary Cardiologist: Johney Frame Electrophysiologist: Allred Bi-V Pacing: 83%            03/15/2022 Office Weight: 241 lbs   AT/AF Burden: 8.5% (taking Eliquis)          Transmission reviewed.    Corvue thoracic impedance normal but was suggesting intermittent days with possible fluid accumulation within the last month.    Prescribed: Furosemide 20 mg Take 2 tablets (40 mg total) by mouth daily in the AM.  Take 1 tablet (20 mg) as needed in the PM for swelling.  Spironolactone 25 mg take 0.5 tablet once a day. Farxiga 10 mg take 1 tablet daily before breakfast   Labs: 11/23/2021 Creatinine 1.42, BUN 23, potassium 4.1, Sodium 140. GFR 50 A complete set of results can be found in Results Review.   Recommendations:  No changes.   Follow-up plan: ICM clinic phone appointment on 06/28/2022.   91 day device clinic remote transmission 08/16/2022.     EP/Cardiology Office Visits:  05/30/2022 with Dr Quentin Ore.     09/12/2022 with Dr Johney Frame.   Copy of ICM check sent to Dr. Rayann Heman.  3 month ICM trend: 05/18/2022.    12-14 Month ICM trend:     [name], RN 05/26/2022 9:31 AM

## 2022-05-30 ENCOUNTER — Encounter: Payer: Medicare HMO | Admitting: Cardiology

## 2022-05-30 ENCOUNTER — Other Ambulatory Visit: Payer: Self-pay | Admitting: Cardiology

## 2022-06-09 DIAGNOSIS — M545 Low back pain, unspecified: Secondary | ICD-10-CM | POA: Insufficient documentation

## 2022-06-14 NOTE — Progress Notes (Signed)
Remote ICD transmission.   

## 2022-06-15 DIAGNOSIS — I10 Essential (primary) hypertension: Secondary | ICD-10-CM | POA: Diagnosis not present

## 2022-06-15 DIAGNOSIS — K219 Gastro-esophageal reflux disease without esophagitis: Secondary | ICD-10-CM | POA: Diagnosis not present

## 2022-06-15 DIAGNOSIS — I890 Lymphedema, not elsewhere classified: Secondary | ICD-10-CM | POA: Diagnosis not present

## 2022-06-15 DIAGNOSIS — Z9049 Acquired absence of other specified parts of digestive tract: Secondary | ICD-10-CM | POA: Diagnosis not present

## 2022-06-15 DIAGNOSIS — I447 Left bundle-branch block, unspecified: Secondary | ICD-10-CM | POA: Diagnosis not present

## 2022-06-15 DIAGNOSIS — E7801 Familial hypercholesterolemia: Secondary | ICD-10-CM | POA: Diagnosis not present

## 2022-06-15 DIAGNOSIS — Z950 Presence of cardiac pacemaker: Secondary | ICD-10-CM | POA: Diagnosis not present

## 2022-06-15 DIAGNOSIS — E785 Hyperlipidemia, unspecified: Secondary | ICD-10-CM | POA: Diagnosis not present

## 2022-06-15 DIAGNOSIS — Z85821 Personal history of Merkel cell carcinoma: Secondary | ICD-10-CM | POA: Diagnosis not present

## 2022-06-15 DIAGNOSIS — I5032 Chronic diastolic (congestive) heart failure: Secondary | ICD-10-CM | POA: Diagnosis not present

## 2022-06-17 ENCOUNTER — Other Ambulatory Visit: Payer: Self-pay

## 2022-06-17 DIAGNOSIS — I2510 Atherosclerotic heart disease of native coronary artery without angina pectoris: Secondary | ICD-10-CM

## 2022-06-17 DIAGNOSIS — Z95810 Presence of automatic (implantable) cardiac defibrillator: Secondary | ICD-10-CM

## 2022-06-17 DIAGNOSIS — I5042 Chronic combined systolic (congestive) and diastolic (congestive) heart failure: Secondary | ICD-10-CM

## 2022-06-17 DIAGNOSIS — Z79899 Other long term (current) drug therapy: Secondary | ICD-10-CM

## 2022-06-17 DIAGNOSIS — E7849 Other hyperlipidemia: Secondary | ICD-10-CM

## 2022-06-17 DIAGNOSIS — I5022 Chronic systolic (congestive) heart failure: Secondary | ICD-10-CM

## 2022-06-17 DIAGNOSIS — E785 Hyperlipidemia, unspecified: Secondary | ICD-10-CM

## 2022-06-17 MED ORDER — DAPAGLIFLOZIN PROPANEDIOL 10 MG PO TABS: 10.0000 mg | ORAL_TABLET | Freq: Every day | ORAL | 3 refills | Status: DC

## 2022-06-17 NOTE — Telephone Encounter (Signed)
**Note De-Identified [place] Fails Obfuscation** BMSPAF has approved the pt for Eliquis asst per approval letter. [id number]  The letter states that they have notified the pt of this approval as well.

## 2022-06-28 ENCOUNTER — Ambulatory Visit (INDEPENDENT_AMBULATORY_CARE_PROVIDER_SITE_OTHER): Payer: Medicare HMO

## 2022-06-28 DIAGNOSIS — I5042 Chronic combined systolic (congestive) and diastolic (congestive) heart failure: Secondary | ICD-10-CM | POA: Diagnosis not present

## 2022-06-28 DIAGNOSIS — Z95810 Presence of automatic (implantable) cardiac defibrillator: Secondary | ICD-10-CM | POA: Diagnosis not present

## 2022-06-30 NOTE — Progress Notes (Signed)
EPIC Encounter for ICM Monitoring  Patient Name: [name] is a 81 y.o. male Date: 06/30/2022 Primary Care Physican: Orpah Melter, MD Primary Cardiologist: Johney Frame Electrophysiologist: Marisa Sprinkles Pacing: 84%            03/15/2022 Office Weight: 241 lbs   AT/AF Burden: 6.9% (taking Eliquis)          Spoke with patient and heart failure questions reviewed.  Pt asymptomatic for fluid accumulation.  Reports feeling well at this time and voices no complaints.    Corvue thoracic impedance suggesting normal fluid levels.    Prescribed: Furosemide 20 mg Take 2 tablets (40 mg total) by mouth daily in the AM.  Take 1 tablet (20 mg) as needed in the PM for swelling.  Spironolactone 25 mg take 0.5 tablet once a day. Farxiga 10 mg take 1 tablet daily before breakfast   Labs: 11/23/2021 Creatinine 1.42, BUN 23, potassium 4.1, Sodium 140. GFR 50 A complete set of results can be found in Results Review.   Recommendations:  No changes and encouraged to call if experiencing any fluid symptoms.   Follow-up plan: ICM clinic phone appointment on 08/08/2022.   91 day device clinic remote transmission 08/16/2022.     EP/Cardiology Office Visits:  08/03/2022 with Dr Quentin Ore.     09/12/2022 with Dr Johney Frame.   Copy of ICM check sent to Dr. Quentin Ore.  3 month ICM trend: 06/28/2022.    12-14 Month ICM trend:     [name], RN 06/30/2022 9:56 AM

## 2022-07-04 ENCOUNTER — Telehealth: Payer: Self-pay

## 2022-07-04 DIAGNOSIS — I472 Ventricular tachycardia, unspecified: Secondary | ICD-10-CM

## 2022-07-04 DIAGNOSIS — Z79899 Other long term (current) drug therapy: Secondary | ICD-10-CM

## 2022-07-04 DIAGNOSIS — R262 Difficulty in walking, not elsewhere classified: Secondary | ICD-10-CM | POA: Diagnosis not present

## 2022-07-04 DIAGNOSIS — M5451 Vertebrogenic low back pain: Secondary | ICD-10-CM | POA: Diagnosis not present

## 2022-07-04 NOTE — Telephone Encounter (Signed)
CV Remote Solution Alert~  Device alert for VT with successful ATP therapy. Event occurred 9/9 @ 06:32, EGM shows AF with onset of sustained VT, rate 210, ATP delivered x1 successfully converting rhythm.   Presenting regular AS/BiV pace. Known PAF, controlled rates, burden 7.5%, Eliquis. Sub optimal BiV pacing 84%, appears consistent with trends Route to triage for VT.  Attempted to contact patient for s/s, medication compliance, driving restriction, shock plan.   No answer, LMTCB.   Has upcoming apt with Dr. Quentin Ore 08/03/22.

## 2022-07-05 NOTE — Telephone Encounter (Signed)
Patient reports during this time he wakes up in the morning and denies any symptoms or aware of any issues. States he lives at [facility] in [place] so his medications are given and no missed doses. Advised of [place] DMV shock plan x6 months and shock plan reviewed with verbal understanding. Patient aware of upcoming apt. Advised I will forward to Dr. Quentin Ore for review and if changes we will call.

## 2022-07-07 NOTE — Telephone Encounter (Signed)
Attempted to contact patient to advise. No answer, LMTCB.

## 2022-07-07 NOTE — Telephone Encounter (Signed)
Patient returned RN's call. 

## 2022-07-08 NOTE — Addendum Note (Signed)
Addended by: Saddie Benders E on: 07/08/2022 11:04 AM   Modules accepted: Orders

## 2022-07-08 NOTE — Telephone Encounter (Signed)
Left message to call back  

## 2022-07-08 NOTE — Telephone Encounter (Signed)
Marcus Epley, MD    07/07/22 12:10 PM  Please order a CMP and Magnesium for [name] given new VT.  Thanks,  Lars Mage   Patient will come on Monday 9/18 for labs, orders placed and appt scheduled.

## 2022-07-11 ENCOUNTER — Other Ambulatory Visit: Payer: Medicare HMO

## 2022-07-13 ENCOUNTER — Ambulatory Visit: Payer: Medicare HMO | Attending: Cardiology

## 2022-07-13 DIAGNOSIS — I472 Ventricular tachycardia, unspecified: Secondary | ICD-10-CM

## 2022-07-13 DIAGNOSIS — R262 Difficulty in walking, not elsewhere classified: Secondary | ICD-10-CM | POA: Diagnosis not present

## 2022-07-13 DIAGNOSIS — Z79899 Other long term (current) drug therapy: Secondary | ICD-10-CM | POA: Diagnosis not present

## 2022-07-13 DIAGNOSIS — M5451 Vertebrogenic low back pain: Secondary | ICD-10-CM | POA: Diagnosis not present

## 2022-07-13 LAB — COMPREHENSIVE METABOLIC PANEL
ALT: 15 IU/L (ref 0–44)
AST: 16 IU/L (ref 0–40)
Albumin/Globulin Ratio: 2.1 (ref 1.2–2.2)
Albumin: 4.2 g/dL (ref 3.7–4.7)
Alkaline Phosphatase: 56 IU/L (ref 44–121)
BUN/Creatinine Ratio: 21 (ref 10–24)
BUN: 36 mg/dL — ABNORMAL HIGH (ref 8–27)
Bilirubin Total: 0.7 mg/dL (ref 0.0–1.2)
CO2: 25 mmol/L (ref 20–29)
Calcium: 9.5 mg/dL (ref 8.6–10.2)
Chloride: 98 mmol/L (ref 96–106)
Creatinine, Ser: 1.71 mg/dL — ABNORMAL HIGH (ref 0.76–1.27)
Globulin, Total: 2 g/dL (ref 1.5–4.5)
Glucose: 126 mg/dL — ABNORMAL HIGH (ref 70–99)
Potassium: 4.4 mmol/L (ref 3.5–5.2)
Sodium: 143 mmol/L (ref 134–144)
Total Protein: 6.2 g/dL (ref 6.0–8.5)
eGFR: 40 mL/min/{1.73_m2} — ABNORMAL LOW (ref >59)

## 2022-07-13 LAB — MAGNESIUM: Magnesium: 2.4 mg/dL — ABNORMAL HIGH (ref 1.6–2.3)

## 2022-07-18 DIAGNOSIS — M5451 Vertebrogenic low back pain: Secondary | ICD-10-CM | POA: Diagnosis not present

## 2022-07-18 DIAGNOSIS — R262 Difficulty in walking, not elsewhere classified: Secondary | ICD-10-CM | POA: Diagnosis not present

## 2022-07-20 DIAGNOSIS — M5451 Vertebrogenic low back pain: Secondary | ICD-10-CM | POA: Diagnosis not present

## 2022-07-20 DIAGNOSIS — R262 Difficulty in walking, not elsewhere classified: Secondary | ICD-10-CM | POA: Diagnosis not present

## 2022-07-25 DIAGNOSIS — M5451 Vertebrogenic low back pain: Secondary | ICD-10-CM | POA: Diagnosis not present

## 2022-07-25 DIAGNOSIS — R262 Difficulty in walking, not elsewhere classified: Secondary | ICD-10-CM | POA: Diagnosis not present

## 2022-08-01 DIAGNOSIS — R262 Difficulty in walking, not elsewhere classified: Secondary | ICD-10-CM | POA: Diagnosis not present

## 2022-08-01 DIAGNOSIS — M5451 Vertebrogenic low back pain: Secondary | ICD-10-CM | POA: Diagnosis not present

## 2022-08-03 ENCOUNTER — Encounter: Payer: Self-pay | Admitting: Cardiology

## 2022-08-03 ENCOUNTER — Ambulatory Visit: Payer: Medicare HMO | Attending: Cardiology | Admitting: Cardiology

## 2022-08-03 VITALS — BP 112/52 | HR 67 | Ht 71.0 in | Wt 232.0 lb

## 2022-08-03 DIAGNOSIS — Z95810 Presence of automatic (implantable) cardiac defibrillator: Secondary | ICD-10-CM

## 2022-08-03 DIAGNOSIS — I5022 Chronic systolic (congestive) heart failure: Secondary | ICD-10-CM

## 2022-08-03 DIAGNOSIS — I4819 Other persistent atrial fibrillation: Secondary | ICD-10-CM | POA: Diagnosis not present

## 2022-08-03 DIAGNOSIS — I255 Ischemic cardiomyopathy: Secondary | ICD-10-CM

## 2022-08-03 LAB — CUP PACEART INCLINIC DEVICE CHECK
Battery Remaining Longevity: 19 mo
Brady Statistic RA Percent Paced: 6 %
Brady Statistic RV Percent Paced: 83 %
Date Time Interrogation Session: 20231011163723
HighPow Impedance: 67.5 Ohm
Implantable Lead Implant Date: 20161011
Implantable Lead Implant Date: 20161011
Implantable Lead Implant Date: 20161011
Implantable Lead Location: 753858
Implantable Lead Location: 753859
Implantable Lead Location: 753860
Implantable Pulse Generator Implant Date: 20161011
Lead Channel Impedance Value: 325 Ohm
Lead Channel Impedance Value: 362.5 Ohm
Lead Channel Impedance Value: 625 Ohm
Lead Channel Pacing Threshold Amplitude: 0.5 V
Lead Channel Pacing Threshold Amplitude: 0.5 V
Lead Channel Pacing Threshold Amplitude: 0.75 V
Lead Channel Pacing Threshold Amplitude: 0.75 V
Lead Channel Pacing Threshold Amplitude: 1.25 V
Lead Channel Pacing Threshold Amplitude: 1.25 V
Lead Channel Pacing Threshold Pulse Width: 0.5 ms
Lead Channel Pacing Threshold Pulse Width: 0.5 ms
Lead Channel Pacing Threshold Pulse Width: 0.5 ms
Lead Channel Pacing Threshold Pulse Width: 0.5 ms
Lead Channel Pacing Threshold Pulse Width: 0.8 ms
Lead Channel Pacing Threshold Pulse Width: 0.8 ms
Lead Channel Sensing Intrinsic Amplitude: 0.9 mV
Lead Channel Sensing Intrinsic Amplitude: 7.4 mV
Lead Channel Setting Pacing Amplitude: 2 V
Lead Channel Setting Pacing Amplitude: 2 V
Lead Channel Setting Pacing Amplitude: 2 V
Lead Channel Setting Pacing Pulse Width: 0.5 ms
Lead Channel Setting Pacing Pulse Width: 0.8 ms
Lead Channel Setting Sensing Sensitivity: 0.5 mV
Pulse Gen Serial Number: 7276664

## 2022-08-03 MED ORDER — AMIODARONE HCL 200 MG PO TABS: ORAL_TABLET | ORAL | 3 refills | Status: AC

## 2022-08-03 NOTE — Patient Instructions (Signed)
Medication Instructions:  Start Amiodarone 400 mg daily for 14 days then 200 mg daily thereafter.  *If you need a refill on your cardiac medications before your next appointment, please call your pharmacy*   Lab Work: CMP, TSH, Free T 4 today and again in 8 weeks.  If you have labs (blood work) drawn today and your tests are completely normal, you will receive your results only by: [place] (if you have MyChart) OR A paper copy in the mail If you have any lab test that is abnormal or we need to change your treatment, we will call you to review the results.   Testing/Procedures: None    Follow-Up: At [facility], you and your health needs are our priority.  As part of our continuing mission to provide you with exceptional heart care, we have created designated Provider Care Teams.  These Care Teams include your primary Cardiologist (physician) and Advanced Practice Providers (APPs -  Physician Assistants and Nurse Practitioners) who all work together to provide you with the care you need, when you need it.  We recommend signing up for the patient portal called "MyChart".  Sign up information is provided on this After Visit Summary.  MyChart is used to connect with patients for Virtual Visits (Telemedicine).  Patients are able to view lab/test results, encounter notes, upcoming appointments, etc.  Non-urgent messages can be sent to your provider as well.   To learn more about what you can do with MyChart, go to [email or web address].    Your next appointment:   4 month(s)  The format for your next appointment:   In Person  Provider:   You will see one of the following Advanced Practice Providers on your designated Care Team:   [name], [name]      Other Instructions none  Important Information About Sugar

## 2022-08-03 NOTE — Progress Notes (Signed)
Electrophysiology Office Follow up Visit Note:    Date:  08/03/2022   ID:  [name], DOB January 23, 1941, MRN [id number]  PCP:  Orpah Melter, MD  [facility] Cardiologist:  Ena Dawley, MD  [facility] Electrophysiologist:  Vickie Epley, MD    Interval History:    [name] is a 81 y.o. male who presents for a follow up visit. They were last seen in clinic by [facility] 03/02/2022. Previously followed by Dr Rayann Heman.   BiV ICD implanted 07/2015. BiV pacing has been poor historically, 80% on last check.  AF burden around 9%.  PVC burden 5-10%.  Today:  He has had "the crud" for about a week. He is COVID-negative, but has some other viral infection which presented as fever and congestion.  His energy has been low for a long time.   He has stopped his spironolactone.  He has 1.6 years left on his pacemaker battery.   He has LE edema. He has always been like this since he had cancer.  He has a lymphedema pump he uses every day.  He has recently moved into assisted living.   He denies any palpitations, chest pain, shortness of breath, or peripheral edema. No lightheadedness, headaches, syncope, orthopnea, or PND.     Past Medical History:  Diagnosis Date   Acid reflux    Biventricular ICD (implantable cardioverter-defibrillator) in place    CAD (coronary artery disease)    a. PCI to Circ with DES on 04/06/15.   Cardiomyopathy ([place])    a. mixed picture - suspected NICM but cannot exclude contribution from CAD.   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 s/p STJ CRTD 07/2015   Obesity    PAF (paroxysmal  atrial fibrillation) (HCC)    Paroxysmal atrial fibrillation ([place])    a. identified on device interrogation. All episodes <3 hours   PVC's (premature ventricular contractions)    Ventricular fibrillation ([place])    a. appropriate ICD therapy 02/2017    Past Surgical History:  Procedure Laterality Date   CARDIAC CATHETERIZATION N/A 04/06/2015   Procedure: Right/Left Heart Cath and Coronary Angiography;  Surgeon: Larey Dresser, MD;  Location: [facility] CV LAB;  Service: Cardiovascular;  Laterality: N/A;   CARDIAC CATHETERIZATION N/A 04/06/2015   Procedure: Coronary Stent Intervention;  Surgeon: Peter M Martinique, MD;  Location: [facility] CV [facility];  Service: Cardiovascular;  Laterality: N/A;   CATARACT EXTRACTION W/ INTRAOCULAR LENS  IMPLANT, BILATERAL Bilateral 11/2014   COLONOSCOPY  2009   EP IMPLANTABLE DEVICE N/A 08/04/2015   STJ CRTD implanted by Dr Rayann Heman for primary prevention/CHF   KNEE CARTILAGE SURGERY Left 1960   SKIN CANCER EXCISION  05/2005; 07/2005   excision of merkel cell   TONSILLECTOMY      Current Medications: Current Meds  Medication Sig   acetaminophen (TYLENOL) 325 MG tablet Take 650 mg by mouth every 6 (six) hours as needed for mild pain or moderate pain.   apixaban (ELIQUIS) 5 MG TABS tablet Take 1 tablet (5 mg total) by mouth 2 (two) times daily.   atorvastatin (LIPITOR)  80 MG tablet Take 1 tablet (80 mg total) by mouth daily. Please keep upcoming appt in October with Dr. Meda Coffee for future refills. Thank you   dapagliflozin propanediol (FARXIGA) 10 MG TABS tablet Take 1 tablet (10 mg total) by mouth daily before breakfast.   fluorouracil (EFUDEX) 5 % cream Apply 5 application topically daily as needed (Patches on face).    furosemide (LASIX) 40 MG tablet TAKE 1 TABLET BY MOUTH TWICE A DAY   levothyroxine (SYNTHROID, LEVOTHROID) 50 MCG tablet Take 50 mcg by mouth daily.   metoprolol succinate (TOPROL-XL) 50 MG 24 hr tablet Take 1.5 tablets (75 mg total) by mouth at  bedtime. Take with or immediately following a meal.   POTASSIUM PO Take 550 mg by mouth daily.   sacubitril-valsartan (ENTRESTO) 24-26 MG Take 1 tablet by mouth 2 (two) times daily.     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  Tobacco Use   Smoking status: Former    Packs/day: 0.25    Years: 15.00    Total pack years: 3.75    Types: Cigarettes    Quit date: 10/25/1983    Years since quitting: 38.8   Smokeless tobacco: Never  Vaping Use   Vaping Use: Never used  Substance and Sexual Activity   Alcohol use: Yes    Alcohol/week: 1.0 standard drink of alcohol    Types: 1 Cans of beer per week    Comment: 08/04/2015 "I doubt if I average a glass of wine and 1 beer q week"   Drug use: No   Sexual activity: Not Currently  Other Topics Concern   Not on file  Social History Narrative   Pt lives in [place], married x 45 years.  2 grown sons have HTN.   Works as a Higher education careers adviser business)   Social Determinants of Radio broadcast assistant Strain: Not on [employer] Insecurity: Not on file  Transportation Needs: Not on file  Physical Activity: Not on file  Stress: Not on file  Social Connections: Not on file     Family History: The patient's family history includes Cancer in his father and mother; Lupus in his mother; Stroke in his sister; Thyroid disease in his sister. There is no history of Heart attack or Hypertension.  ROS:   Please see the history of present illness.   (+) Fatigue (+) LE Edema    All other systems reviewed and are negative.  EKGs/Labs/Other Studies Reviewed:    The following studies were reviewed today:  08/03/2022 in clinic device interrogation personally reviewed Battery  longevity 1.6 years Lead parameters stable DDD 60-120 AF burden 7.6% PVC burden 6.2% BiV pacing percentage 83% 1 VT episode on Sept 9 converted with ATP.  03/17/2022 Echo EF 40-45% RV mildly reduced Mild MR    EKG:  The ekg ordered today demonstrates a pace, v pace with PVCs  Recent Labs: 11/23/2021: Hemoglobin 14.0; Platelets 102 07/13/2022: ALT 15; BUN 36; Creatinine, Ser 1.71; Magnesium 2.4; Potassium 4.4; Sodium 143  Recent Lipid Panel    Component Value Date/Time   CHOL 97 (L) 06/03/2021 0923   TRIG 49 06/03/2021 0923   HDL 39 (L) 06/03/2021 3825  CHOLHDL 2.5 06/03/2021 0923   CHOLHDL 1.9 05/04/2016 1001   VLDL 10 05/04/2016 1001   LDLCALC 46 06/03/2021 0923    Physical Exam:    VS:  BP (!) 112/52   Pulse 67   Ht '5\' 11"'$  (1.803 m)   Wt 232 lb (105.2 kg)   SpO2 92%   BMI 32.36 kg/m     Wt Readings from Last 3 Encounters:  08/03/22 232 lb (105.2 kg)  03/15/22 241 lb (109.3 kg)  03/02/22 244 lb (110.7 kg)     GEN: Well nourished, well developed in no acute distress. Elderly. HEENT: Normal NECK: No JVD; No carotid bruits LYMPHATICS: No lymphadenopathy CARDIAC: RRR, no murmurs, rubs, gallops. ICD pocket well healed. RESPIRATORY:  Clear to auscultation without rales, wheezing or rhonchi  ABDOMEN: Soft, non-tender, non-distended MUSCULOSKELETAL:  No edema; No deformity  SKIN: Warm and dry NEUROLOGIC:  Alert and oriented x 3 PSYCHIATRIC:  Normal affect        ASSESSMENT:    1. Chronic systolic CHF (congestive heart failure) ([place])   2. Ischemic cardiomyopathy   3. Persistent atrial fibrillation (HCC)   4. Cardiac resynchronization therapy defibrillator (CRT-D) in place    PLAN:    In order of problems listed above:  #Chronic systolic heart failure #paroxysmal AF EF 40% in May of this year.  CRT pacing sub-optimal. I suspect both AF and PVC are contributing. I would like to suppress both with amiodarone.  Start 400 mg amiodarone once a day for  14 days, drop down to 200 mg once a day afterwards CMP, TSH and FT4 today   #CRT-D Device functioning well albeit with a sub-optimal pacing percentage. Start amio as above. Continue remote monitoring.  #VT 1 episode successfully treated with ATP. Amio as above. Continue remote monitoring.  Follow up  in 8 weeks with repeat blood work Follow up with Jonni Sanger in 4 months   Total time spent with patient today 41 minutes. This includes reviewing records, evaluating the patient and coordinating care.   Medication Adjustments/Labs and Tests Ordered: Current medicines are reviewed at length with the patient today.  Concerns regarding medicines are outlined above.  No orders of the defined types were placed in this encounter.  No orders of the defined types were placed in this encounter.   I,Mary Mosetta Pigeon Buren,acting as a scribe for Vickie Epley, MD.,have documented all relevant documentation on the behalf of Vickie Epley, MD,as directed by  Vickie Epley, MD while in the presence of Vickie Epley, MD.   I, Vickie Epley, MD, have reviewed all documentation for this visit. The documentation on 08/03/22 for the exam, diagnosis, procedures, and orders are all accurate and complete.    Signed, Lars Mage, MD, [facility], [facility] 08/03/2022 3:37 PM    Electrophysiology [facility]

## 2022-08-03 NOTE — Progress Notes (Deleted)
Electrophysiology Office Note Date: 08/03/2022  ID:  Baraka, Klatt 10-03-1941, MRN [id number]  PCP: Orpah Melter, MD Primary Cardiologist: Ena Dawley, MD Electrophysiologist: Vickie Epley, MD   CC: Routine ICD follow-up  [name] is a 81 y.o. male seen today for Vickie Epley, MD for routine electrophysiology followup. Since last being seen in our clinic the patient reports doing ***.  he denies chest pain, palpitations, dyspnea, PND, orthopnea, nausea, vomiting, dizziness, syncope, edema, weight gain, or early satiety.     He has not had ICD shocks.   Device History: St. Jude BiV ICD implanted 07/2015 for NICM  Past Medical History:  Diagnosis Date   Acid reflux    Biventricular ICD (implantable cardioverter-defibrillator) in place    CAD (coronary artery disease)    a. PCI to Circ with DES on 04/06/15.   Cardiomyopathy ([place])    a. mixed picture - suspected NICM but cannot exclude contribution from CAD.   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 s/p STJ CRTD 07/2015   Obesity    PAF (paroxysmal atrial fibrillation) (HCC)    Paroxysmal atrial fibrillation ([place])    a. identified on device interrogation. All episodes <3 hours   PVC's (premature ventricular contractions)    Ventricular fibrillation ([place])    a. appropriate ICD therapy 02/2017   Past Surgical History:  Procedure Laterality Date   CARDIAC CATHETERIZATION N/A 04/06/2015   Procedure: Right/Left Heart Cath and Coronary Angiography;  Surgeon: Larey Dresser, MD;  Location: [place];  Service: Cardiovascular;  Laterality:  N/A;   CARDIAC CATHETERIZATION N/A 04/06/2015   Procedure: Coronary Stent Intervention;  Surgeon: Peter M Martinique, MD;  Location: [facility];  Service: Cardiovascular;  Laterality: N/A;   CATARACT EXTRACTION W/ INTRAOCULAR LENS  IMPLANT, BILATERAL Bilateral 11/2014   COLONOSCOPY  2009   EP IMPLANTABLE DEVICE N/A 08/04/2015   STJ CRTD implanted by Dr Rayann Heman for primary prevention/CHF   KNEE CARTILAGE SURGERY Left 1960   SKIN CANCER EXCISION  05/2005; 07/2005   excision of merkel cell   TONSILLECTOMY      Current Outpatient Medications  Medication Sig Dispense Refill   acetaminophen (TYLENOL) 325 MG tablet Take 650 mg by mouth every 6 (six) hours as needed for mild pain or moderate pain.     apixaban (ELIQUIS) 5 MG TABS tablet Take 1 tablet (5 mg total) by mouth 2 (two) times daily. 60 tablet 5   atorvastatin (LIPITOR) 80 MG tablet Take 1 tablet (80 mg total) by mouth daily. Please keep upcoming appt in October with Dr. Meda Coffee for future refills. Thank you 90 tablet 0   COVID-19 mRNA Vac-TriS, Pfizer, SUSP injection Inject into the muscle. 0.3 mL 0   dapagliflozin propanediol (FARXIGA) 10 MG TABS tablet Take 1 tablet (10 mg total) by mouth daily before breakfast. 90 tablet 3   fluorouracil (EFUDEX) 5 % cream Apply 5 application topically daily as needed (Patches on face).   0   furosemide (LASIX) 20 MG tablet Take 2 tablets (40 mg total) by mouth daily in  the AM.  Take 1 tablet (20 mg) as needed in the PM for swelling. 180 tablet 3   furosemide (LASIX) 40 MG tablet TAKE 1 TABLET BY MOUTH TWICE A DAY 180 tablet 3   levothyroxine (SYNTHROID, LEVOTHROID) 50 MCG tablet Take 50 mcg by mouth daily.  5   methylPREDNISolone (MEDROL DOSEPAK) 4 MG TBPK tablet Take by mouth. Follow package insert 21 each 0   metoprolol succinate (TOPROL-XL) 50 MG 24 hr tablet Take 1.5 tablets (75 mg total) by mouth at bedtime. Take with or immediately following a meal. 135 tablet 3   POTASSIUM PO Take 550 mg by  mouth daily.     sacubitril-valsartan (ENTRESTO) 24-26 MG Take 1 tablet by mouth 2 (two) times daily. 180 tablet 0   spironolactone (ALDACTONE) 25 MG tablet TAKE 1/2 TABLET BY MOUTH EVERY DAY 45 tablet 3   tiZANidine (ZANAFLEX) 2 MG tablet Take 1 tablet (2 mg total) by mouth every 12 (twelve) hours as needed for up to 15 doses for muscle spasms. 15 tablet 0   No current facility-administered medications for this visit.   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  Tobacco Use   Smoking status: Former    Packs/day: 0.25    Years: 15.00    Total pack years: 3.75    Types: Cigarettes    Quit date: 10/25/1983    Years since quitting: 38.8   Smokeless tobacco: Never  Vaping Use   Vaping Use: Never used  Substance and Sexual Activity   Alcohol use: Yes    Alcohol/week: 1.0 standard drink of alcohol    Types: 1 Cans of beer per week    Comment: 08/04/2015 "I doubt if I average a glass of wine and 1 beer q week"   Drug use: No   Sexual activity: Not Currently  Other Topics Concern   Not on file  Social History Narrative   Pt lives in [place], married x 45 years.  2 grown sons have HTN.   Works as a Higher education careers adviser business)   Social Determinants of Radio broadcast assistant Strain: Not on Art therapist Insecurity: Not on file  Transportation Needs: Not on file  Physical Activity: Not on file  Stress: Not on file  Social Connections: Not on file  Intimate Partner  Violence: Not on file    Family History: Family History  Problem Relation Age of Onset   Cancer Mother        breast   Lupus Mother    Cancer Father        prostate   Stroke Sister    Thyroid disease Sister    Heart attack Neg Hx    Hypertension Neg Hx     Review of Systems: All other systems reviewed and are otherwise negative except as noted above.   Physical Exam: There were no vitals filed for this visit.   GEN- The patient is well appearing, alert and  oriented x 3 today.   HEENT: normocephalic, atraumatic; sclera clear, conjunctiva pink; hearing intact; oropharynx clear; neck supple, no JVP Lymph- no cervical lymphadenopathy Lungs- Clear to ausculation bilaterally, normal work of breathing.  No wheezes, rales, rhonchi Heart- Regular  rate and rhythm, no murmurs, rubs or gallops, PMI not laterally displaced GI- soft, non-tender, non-distended, bowel sounds present, no hepatosplenomegaly Extremities- no clubbing or cyanosis. No peripheral edema; DP/PT/radial pulses 2+ bilaterally MS- no significant deformity or atrophy Skin- warm and dry, no rash or lesion; ICD pocket well healed Psych- euthymic mood, full affect Neuro- strength and sensation are intact  ICD interrogation- reviewed in detail today,  See PACEART report  EKG:  EKG is ordered today. Personal review of EKG ordered today shows ***  Recent Labs: 11/23/2021: Hemoglobin 14.0; Platelets 102 07/13/2022: ALT 15; BUN 36; Creatinine, Ser 1.71; Magnesium 2.4; Potassium 4.4; Sodium 143   Wt Readings from Last 3 Encounters:  03/15/22 241 lb (109.3 kg)  03/02/22 244 lb (110.7 kg)  02/02/22 242 lb (109.8 kg)     Other studies Reviewed: Additional studies/ records that were reviewed today include: Previous EP office notes.   Assessment and Plan:  1.  Chronic systolic dysfunction s/p St. Jude CRT-D  euvolemic today Stable on an appropriate medical regimen Normal ICD function See Pace Art report No changes  today PVAB and atrial sensitivity adjusted previously BiV% increased 74% -> 83%. Hovering around 80% Echo 03/17/2022 LVEF 40-45%   2. Prior VF arrest No ICD shocks   3. Paroxysmal atrial fibrillation  Burden *** on remote 4/27 Programming changes have been made in attempt to better sense and hopefully maintain better BiV pacing.  Continue Eliquis   4. Obesity There is no height or weight on file to calculate BMI.   Current medicines are reviewed at length with the patient today.   =  Labs/ tests ordered today include: *** No orders of the defined types were placed in this encounter.    Disposition:   Follow up with {EPMDS:28135} {Blank single:19197::"in 2 weeks","in 4 weeks","in 3 months","in 6 months","in 12 months","as usual post gen change"}    Signed, Shirley Friar, PA-C  08/03/2022 9:42 AM  [facility] [address] [place]  [address] [phone] (office) [phone] (fax)

## 2022-08-03 NOTE — Progress Notes (Deleted)
Electrophysiology Office Follow up Visit Note:    Date:  08/03/2022   ID:  [name], DOB [birth year], MRN [id number]  PCP:  Marcus Melter, MD  [facility] Cardiologist:  Marcus Dawley, MD  [facility] Electrophysiologist:  Marcus Epley, MD    Interval History:    [name] is a 81 y.o. male who presents for a follow up visit. They were last seen in clinic by [name] 03/02/2022. Previously followed by Dr [name].   BiV ICD implanted 07/2015. BiV pacing has been poor historically, 80% on last check.  AF burden around 9%.  PVC burden 5-10%.    Past Medical History:  Diagnosis Date   Acid reflux    Biventricular ICD (implantable cardioverter-defibrillator) in place    CAD (coronary artery disease)    a. PCI to Circ with DES on 04/06/15.   Cardiomyopathy ([place])    a. mixed picture - suspected NICM but cannot exclude contribution from CAD.   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 s/p STJ CRTD 07/2015   Obesity    PAF (paroxysmal atrial fibrillation) (HCC)    Paroxysmal atrial fibrillation ([place])    a. identified on device interrogation. All episodes <3 hours   PVC's (premature ventricular contractions)    Ventricular fibrillation ([place])    a. appropriate ICD therapy 02/2017    Past Surgical History:  Procedure Laterality Date   CARDIAC CATHETERIZATION N/A 04/06/2015   Procedure: Right/Left Heart Cath and Coronary Angiography;  Surgeon: Larey Dresser, MD;  Location: [facility];  Service: Cardiovascular;  Laterality: N/A;   CARDIAC CATHETERIZATION N/A 04/06/2015   Procedure: Coronary Stent  Intervention;  Surgeon: Peter M Martinique, MD;  Location: [place] [facility];  Service: Cardiovascular;  Laterality: N/A;   CATARACT EXTRACTION W/ INTRAOCULAR LENS  IMPLANT, BILATERAL Bilateral 11/2014   COLONOSCOPY  2009   EP IMPLANTABLE DEVICE N/A 08/04/2015   STJ CRTD implanted by Dr [name] for primary prevention/CHF   [place]  05/2005; 07/2005   excision of merkel cell   TONSILLECTOMY      Current Medications: No outpatient medications have been marked as taking for the 08/03/22 encounter (Appointment) with Marcus Epley, MD.     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  Tobacco Use   Smoking status: Former    Packs/day: 0.25    Years: 15.00    Total pack years: 3.75    Types: Cigarettes    Quit date: 10/25/1983    Years since quitting: 38.8   Smokeless tobacco: Never  Vaping Use   Vaping Use: Never used  Substance and Sexual Activity   Alcohol use: Yes    Alcohol/week: 1.0 standard drink of alcohol    Types: 1 Cans of beer per  week    Comment: 08/04/2015 "I doubt if I average a glass of wine and 1 beer q week"   Drug use: No   Sexual activity: Not Currently  Other Topics Concern   Not on file  Social History Narrative   Pt lives in [place], married x 45 years.  2 grown sons have HTN.   Works as a Higher education careers adviser business)   Social Determinants of Radio broadcast assistant Strain: Not on [employer] Insecurity: Not on file  Transportation Needs: Not on file  Physical Activity: Not on file  Stress: Not on file  Social Connections: Not on file     Family History: The patient's family history includes Cancer in his father and mother; Lupus in his  mother; Stroke in his sister; Thyroid disease in his sister. There is no history of Heart attack or Hypertension.  ROS:   Please see the history of present illness.    All other systems reviewed and are negative.  EKGs/Labs/Other Studies Reviewed:    The following studies were reviewed today:  08/03/2022 in clinic device interrogation personally reviewed ***  03/17/2022 Echo EF 40-45% RV mildly reduced Mild MR    EKG:  The ekg ordered today demonstrates ***  Recent Labs: 11/23/2021: Hemoglobin 14.0; Platelets 102 07/13/2022: ALT 15; BUN 36; Creatinine, Ser 1.71; Magnesium 2.4; Potassium 4.4; Sodium 143  Recent Lipid Panel    Component Value Date/Time   CHOL 97 (L) 06/03/2021 0923   TRIG 49 06/03/2021 0923   HDL 39 (L) 06/03/2021 0923   CHOLHDL 2.5 06/03/2021 0923   CHOLHDL 1.9 05/04/2016 1001   VLDL 10 05/04/2016 1001   LDLCALC 46 06/03/2021 0923    Physical Exam:    VS:  There were no vitals taken for this visit.    Wt Readings from Last 3 Encounters:  03/15/22 241 lb (109.3 kg)  03/02/22 244 lb (110.7 kg)  02/02/22 242 lb (109.8 kg)     GEN: *** Well nourished, well developed in no acute distress HEENT: Normal NECK: No JVD; No carotid bruits LYMPHATICS: No lymphadenopathy CARDIAC: ***RRR, no murmurs, rubs, gallops RESPIRATORY:  Clear to auscultation without rales, wheezing or rhonchi  ABDOMEN: Soft, non-tender, non-distended MUSCULOSKELETAL:  No edema; No deformity  SKIN: Warm and dry NEUROLOGIC:  Alert and oriented x 3 PSYCHIATRIC:  Normal affect        ASSESSMENT:    1. Chronic systolic CHF (congestive heart failure) ([place])   2. Ischemic cardiomyopathy   3. Persistent atrial fibrillation (HCC)   4. Cardiac resynchronization therapy defibrillator (CRT-D) in place    PLAN:    In order of problems listed above:  #Chronic systolic heart failure #paroxysmal AF EF 40% in May of this year.  CRT pacing sub-optimal. I suspect both AF and PVC are  contributing. I would like to suppress both with amiodarone.  #CRT-D Device functioning well albeit with a sub-optimal pacing percentage. Start amio as above. Continue remote monitoring.  Follow up 6 months to reassess BiV pacing %. Follow up labwork in 3 months.    Total time spent with patient today *** minutes. This includes reviewing records, evaluating the patient and coordinating care.   Medication Adjustments/Labs and Tests Ordered: Current medicines are reviewed at length with the patient today.  Concerns regarding medicines are outlined above.  No orders of the defined types were placed in this encounter.  No orders of the defined types were placed in this encounter.  Signed, Marcus Mage, MD, [facility], [facility] 08/03/2022 5:55 AM    Electrophysiology [facility]

## 2022-08-04 ENCOUNTER — Ambulatory Visit: Payer: Medicare HMO | Admitting: Student

## 2022-08-04 DIAGNOSIS — I2510 Atherosclerotic heart disease of native coronary artery without angina pectoris: Secondary | ICD-10-CM

## 2022-08-04 DIAGNOSIS — I5022 Chronic systolic (congestive) heart failure: Secondary | ICD-10-CM

## 2022-08-04 DIAGNOSIS — I472 Ventricular tachycardia, unspecified: Secondary | ICD-10-CM

## 2022-08-04 DIAGNOSIS — I480 Paroxysmal atrial fibrillation: Secondary | ICD-10-CM

## 2022-08-04 DIAGNOSIS — R262 Difficulty in walking, not elsewhere classified: Secondary | ICD-10-CM | POA: Diagnosis not present

## 2022-08-04 DIAGNOSIS — I5042 Chronic combined systolic (congestive) and diastolic (congestive) heart failure: Secondary | ICD-10-CM

## 2022-08-04 DIAGNOSIS — M5451 Vertebrogenic low back pain: Secondary | ICD-10-CM | POA: Diagnosis not present

## 2022-08-04 LAB — COMPREHENSIVE METABOLIC PANEL
ALT: 15 IU/L (ref 0–44)
AST: 19 IU/L (ref 0–40)
Albumin/Globulin Ratio: 2.3 — ABNORMAL HIGH (ref 1.2–2.2)
Albumin: 4.2 g/dL (ref 3.7–4.7)
Alkaline Phosphatase: 58 IU/L (ref 44–121)
BUN/Creatinine Ratio: 19 (ref 10–24)
BUN: 34 mg/dL — ABNORMAL HIGH (ref 8–27)
Bilirubin Total: 0.6 mg/dL (ref 0.0–1.2)
CO2: 26 mmol/L (ref 20–29)
Calcium: 9.3 mg/dL (ref 8.6–10.2)
Chloride: 100 mmol/L (ref 96–106)
Creatinine, Ser: 1.75 mg/dL — ABNORMAL HIGH (ref 0.76–1.27)
Globulin, Total: 1.8 g/dL (ref 1.5–4.5)
Glucose: 104 mg/dL — ABNORMAL HIGH (ref 70–99)
Potassium: 4.5 mmol/L (ref 3.5–5.2)
Sodium: 142 mmol/L (ref 134–144)
Total Protein: 6 g/dL (ref 6.0–8.5)
eGFR: 39 mL/min/{1.73_m2} — ABNORMAL LOW (ref >59)

## 2022-08-04 LAB — T4, FREE: Free T4: 1.17 ng/dL (ref 0.82–1.77)

## 2022-08-04 LAB — TSH: TSH: 3.43 u[IU]/mL (ref 0.450–4.500)

## 2022-08-08 ENCOUNTER — Ambulatory Visit (INDEPENDENT_AMBULATORY_CARE_PROVIDER_SITE_OTHER): Payer: Medicare HMO

## 2022-08-08 DIAGNOSIS — I5022 Chronic systolic (congestive) heart failure: Secondary | ICD-10-CM | POA: Diagnosis not present

## 2022-08-08 DIAGNOSIS — Z95810 Presence of automatic (implantable) cardiac defibrillator: Secondary | ICD-10-CM

## 2022-08-08 DIAGNOSIS — M5451 Vertebrogenic low back pain: Secondary | ICD-10-CM | POA: Diagnosis not present

## 2022-08-08 DIAGNOSIS — R262 Difficulty in walking, not elsewhere classified: Secondary | ICD-10-CM | POA: Diagnosis not present

## 2022-08-09 DIAGNOSIS — I5032 Chronic diastolic (congestive) heart failure: Secondary | ICD-10-CM | POA: Diagnosis not present

## 2022-08-09 DIAGNOSIS — R059 Cough, unspecified: Secondary | ICD-10-CM | POA: Diagnosis not present

## 2022-08-09 DIAGNOSIS — I10 Essential (primary) hypertension: Secondary | ICD-10-CM | POA: Diagnosis not present

## 2022-08-09 DIAGNOSIS — Z950 Presence of cardiac pacemaker: Secondary | ICD-10-CM | POA: Diagnosis not present

## 2022-08-09 DIAGNOSIS — J168 Pneumonia due to other specified infectious organisms: Secondary | ICD-10-CM | POA: Diagnosis not present

## 2022-08-12 ENCOUNTER — Telehealth: Payer: Self-pay

## 2022-08-12 NOTE — Progress Notes (Signed)
EPIC Encounter for ICM Monitoring  Patient Name: [name] is a 81 y.o. male Date: 08/12/2022 Primary Care Physican: Orpah Melter, MD Primary Cardiologist: Johney Frame Electrophysiologist: Marisa Sprinkles Pacing: 84%            03/15/2022 Office Weight: 241 lbs   AT/AF Burden: 7.4% (taking Eliquis)          Attempted call to patient and unable to reach. Transmission reviewed.    Corvue thoracic impedance suggesting normal fluid levels but was suggesting possible fluid accumulation from 10/3-10/11.    Prescribed: Furosemide 20 mg Take 2 tablets (40 mg total) by mouth daily in the AM.  Take 1 tablet (20 mg) as needed in the PM for swelling.  Spironolactone 25 mg take 0.5 tablet once a day. Farxiga 10 mg take 1 tablet daily before breakfast   Labs: 11/23/2021 Creatinine 1.42, BUN 23, potassium 4.1, Sodium 140. GFR 50 A complete set of results can be found in Results Review.   Recommendations:  Unable to reach.     Follow-up plan: ICM clinic phone appointment on 09/12/2022.   91 day device clinic remote transmission 08/16/2022.     EP/Cardiology Office Visits:      09/12/2022 with Dr Johney Frame.   Copy of ICM check sent to Dr. Quentin Ore.   3 month ICM trend: 08/08/2022.    12-14 Month ICM trend:     [name], RN 08/12/2022 3:18 PM

## 2022-08-12 NOTE — Telephone Encounter (Signed)
Remote ICM transmission received.  Attempted call to patient regarding ICM remote transmission and no answer or voice mail option.  

## 2022-08-15 DIAGNOSIS — M5451 Vertebrogenic low back pain: Secondary | ICD-10-CM | POA: Diagnosis not present

## 2022-08-15 DIAGNOSIS — R262 Difficulty in walking, not elsewhere classified: Secondary | ICD-10-CM | POA: Diagnosis not present

## 2022-08-16 ENCOUNTER — Ambulatory Visit (INDEPENDENT_AMBULATORY_CARE_PROVIDER_SITE_OTHER): Payer: Medicare HMO

## 2022-08-16 DIAGNOSIS — I255 Ischemic cardiomyopathy: Secondary | ICD-10-CM | POA: Diagnosis not present

## 2022-08-18 LAB — CUP PACEART REMOTE DEVICE CHECK
Battery Remaining Longevity: 17 mo
Battery Remaining Percentage: 21 %
Battery Voltage: 2.78 V
Brady Statistic AP VP Percent: 12 %
Brady Statistic AP VS Percent: 1 %
Brady Statistic AS VP Percent: 75 %
Brady Statistic AS VS Percent: 5.6 %
Brady Statistic RA Percent Paced: 6.7 %
Date Time Interrogation Session: 20231026063640
HighPow Impedance: 68 Ohm
HighPow Impedance: 68 Ohm
Implantable Lead Connection Status: 753985
Implantable Lead Connection Status: 753985
Implantable Lead Connection Status: 753985
Implantable Lead Implant Date: 20161011
Implantable Lead Implant Date: 20161011
Implantable Lead Implant Date: 20161011
Implantable Lead Location: 753858
Implantable Lead Location: 753859
Implantable Lead Location: 753860
Implantable Pulse Generator Implant Date: 20161011
Lead Channel Impedance Value: 310 Ohm
Lead Channel Impedance Value: 360 Ohm
Lead Channel Impedance Value: 640 Ohm
Lead Channel Pacing Threshold Amplitude: 0.5 V
Lead Channel Pacing Threshold Amplitude: 0.875 V
Lead Channel Pacing Threshold Amplitude: 1.25 V
Lead Channel Pacing Threshold Pulse Width: 0.5 ms
Lead Channel Pacing Threshold Pulse Width: 0.5 ms
Lead Channel Pacing Threshold Pulse Width: 0.8 ms
Lead Channel Sensing Intrinsic Amplitude: 3.1 mV
Lead Channel Sensing Intrinsic Amplitude: 5.6 mV
Lead Channel Setting Pacing Amplitude: 2 V
Lead Channel Setting Pacing Amplitude: 2 V
Lead Channel Setting Pacing Amplitude: 2 V
Lead Channel Setting Pacing Pulse Width: 0.5 ms
Lead Channel Setting Pacing Pulse Width: 0.8 ms
Lead Channel Setting Sensing Sensitivity: 0.5 mV
Pulse Gen Serial Number: 7276664

## 2022-08-23 ENCOUNTER — Telehealth: Payer: Self-pay

## 2022-08-23 DIAGNOSIS — Z596 Low income: Secondary | ICD-10-CM

## 2022-08-23 MED ORDER — ENTRESTO 24-26 MG PO TABS: 1.0000 | ORAL_TABLET | Freq: Two times a day (BID) | ORAL | 3 refills | Status: AC

## 2022-08-23 NOTE — Telephone Encounter (Signed)
Paperwork signed and dated by Dr. Johney Frame. All forms faxed off to the contact information provided on cover sheet.   Confirmation fax received.   Will make PA Nurse aware of completion.

## 2022-08-23 NOTE — Telephone Encounter (Signed)
**Note De-Identified Imoni Kohen Obfuscation** Providers page of a [employer] patient assistance application for Delene Loll was faxed to [place] from Lelon Huh, CPhT with [facility] with a request that we complete it, have Dr Johney Frame sign/date it, and to then fax it back to attention *Izora Gala* at [facility] on our office cover sheet. I have completed the page and have e-mailed it to Dr Jacolyn Reedy nurse so she can print a Entresto 24-26 mg RX for #180 with 3 refills, have Dr Johney Frame sign/date it and the application, and to fax back to "attention Izora Gala" at [facility] at the fax number [phone]) written on the cover letter included.

## 2022-08-23 NOTE — Telephone Encounter (Signed)
**Note De-Identified Akesha Uresti Obfuscation** Providers page of a [place] patient assistance application for Wilder Glade was faxed to [place] from Lelon Huh, CPhT with [facility] with a request that we complete it, have Dr Johney Frame sign/date it, and to then fax it back to attention *Izora Gala* at [facility] on our office cover sheet. I have completed the page and have e-mailed it to Dr Jacolyn Reedy nurse so she can obtain her signature, date it, and to fax back to "attention Izora Gala" at [facility] at the fax number [phone]) written on the cover letter included.

## 2022-08-23 NOTE — Telephone Encounter (Signed)
Paperwork/script signed and dated by Dr. Johney Frame. All forms/script faxed off to the contact information provided on cover sheet.   Confirmation fax received.   Will make PA Nurse aware of completion.

## 2022-08-23 NOTE — Progress Notes (Signed)
[name])                                            [place] Team    08/23/2022  [name] 1941-03-30 [id number]                                 Medication Assistance Referral  Referral From:  [facility] [place]  Medication/Company: Wilder Glade / [place] Patient application portion:  Mailed Provider application portion: Faxed  to Dr. Johney Frame Provider address/fax verified via: Office website  Medication/Company: Delene Loll / [employer] Patient application portion:  Mailed Provider application portion: Faxed  to Dr. Johney Frame Provider address/fax verified via: Office website  [name], [place] [phone]

## 2022-08-30 ENCOUNTER — Other Ambulatory Visit: Payer: Self-pay | Admitting: Internal Medicine

## 2022-08-30 DIAGNOSIS — R262 Difficulty in walking, not elsewhere classified: Secondary | ICD-10-CM | POA: Diagnosis not present

## 2022-08-30 DIAGNOSIS — M5451 Vertebrogenic low back pain: Secondary | ICD-10-CM | POA: Diagnosis not present

## 2022-08-30 DIAGNOSIS — I5022 Chronic systolic (congestive) heart failure: Secondary | ICD-10-CM

## 2022-09-01 DIAGNOSIS — R262 Difficulty in walking, not elsewhere classified: Secondary | ICD-10-CM | POA: Diagnosis not present

## 2022-09-01 DIAGNOSIS — M5451 Vertebrogenic low back pain: Secondary | ICD-10-CM | POA: Diagnosis not present

## 2022-09-03 DIAGNOSIS — I447 Left bundle-branch block, unspecified: Secondary | ICD-10-CM | POA: Diagnosis not present

## 2022-09-03 DIAGNOSIS — E785 Hyperlipidemia, unspecified: Secondary | ICD-10-CM | POA: Diagnosis not present

## 2022-09-03 DIAGNOSIS — N179 Acute kidney failure, unspecified: Secondary | ICD-10-CM | POA: Diagnosis not present

## 2022-09-03 DIAGNOSIS — I080 Rheumatic disorders of both mitral and aortic valves: Secondary | ICD-10-CM | POA: Diagnosis not present

## 2022-09-03 DIAGNOSIS — I5020 Unspecified systolic (congestive) heart failure: Secondary | ICD-10-CM | POA: Diagnosis not present

## 2022-09-03 DIAGNOSIS — R079 Chest pain, unspecified: Secondary | ICD-10-CM | POA: Diagnosis not present

## 2022-09-03 DIAGNOSIS — I5022 Chronic systolic (congestive) heart failure: Secondary | ICD-10-CM | POA: Diagnosis not present

## 2022-09-03 DIAGNOSIS — I081 Rheumatic disorders of both mitral and tricuspid valves: Secondary | ICD-10-CM | POA: Diagnosis not present

## 2022-09-03 DIAGNOSIS — Z95810 Presence of automatic (implantable) cardiac defibrillator: Secondary | ICD-10-CM | POA: Diagnosis not present

## 2022-09-03 DIAGNOSIS — I700 Atherosclerosis of aorta: Secondary | ICD-10-CM | POA: Diagnosis not present

## 2022-09-03 DIAGNOSIS — N170 Acute kidney failure with tubular necrosis: Secondary | ICD-10-CM | POA: Diagnosis not present

## 2022-09-03 DIAGNOSIS — Z950 Presence of cardiac pacemaker: Secondary | ICD-10-CM | POA: Diagnosis not present

## 2022-09-03 DIAGNOSIS — A0839 Other viral enteritis: Secondary | ICD-10-CM | POA: Diagnosis not present

## 2022-09-03 DIAGNOSIS — E669 Obesity, unspecified: Secondary | ICD-10-CM | POA: Diagnosis not present

## 2022-09-03 DIAGNOSIS — I130 Hypertensive heart and chronic kidney disease with heart failure and stage 1 through stage 4 chronic kidney disease, or unspecified chronic kidney disease: Secondary | ICD-10-CM | POA: Diagnosis not present

## 2022-09-03 DIAGNOSIS — R918 Other nonspecific abnormal finding of lung field: Secondary | ICD-10-CM | POA: Diagnosis not present

## 2022-09-03 DIAGNOSIS — J40 Bronchitis, not specified as acute or chronic: Secondary | ICD-10-CM | POA: Diagnosis not present

## 2022-09-03 DIAGNOSIS — I4892 Unspecified atrial flutter: Secondary | ICD-10-CM | POA: Diagnosis not present

## 2022-09-03 DIAGNOSIS — G4730 Sleep apnea, unspecified: Secondary | ICD-10-CM | POA: Diagnosis not present

## 2022-09-03 DIAGNOSIS — J9811 Atelectasis: Secondary | ICD-10-CM | POA: Diagnosis not present

## 2022-09-03 DIAGNOSIS — R42 Dizziness and giddiness: Secondary | ICD-10-CM | POA: Diagnosis not present

## 2022-09-03 DIAGNOSIS — U071 COVID-19: Secondary | ICD-10-CM | POA: Diagnosis not present

## 2022-09-03 DIAGNOSIS — I484 Atypical atrial flutter: Secondary | ICD-10-CM | POA: Diagnosis not present

## 2022-09-03 DIAGNOSIS — R590 Localized enlarged lymph nodes: Secondary | ICD-10-CM | POA: Diagnosis not present

## 2022-09-03 DIAGNOSIS — I420 Dilated cardiomyopathy: Secondary | ICD-10-CM | POA: Diagnosis not present

## 2022-09-03 DIAGNOSIS — I428 Other cardiomyopathies: Secondary | ICD-10-CM | POA: Diagnosis not present

## 2022-09-03 DIAGNOSIS — Z4502 Encounter for adjustment and management of automatic implantable cardiac defibrillator: Secondary | ICD-10-CM | POA: Diagnosis not present

## 2022-09-03 DIAGNOSIS — R000 Tachycardia, unspecified: Secondary | ICD-10-CM | POA: Diagnosis not present

## 2022-09-03 DIAGNOSIS — I4719 Other supraventricular tachycardia: Secondary | ICD-10-CM | POA: Diagnosis not present

## 2022-09-03 DIAGNOSIS — I959 Hypotension, unspecified: Secondary | ICD-10-CM | POA: Diagnosis not present

## 2022-09-03 DIAGNOSIS — I4891 Unspecified atrial fibrillation: Secondary | ICD-10-CM | POA: Diagnosis not present

## 2022-09-03 DIAGNOSIS — I517 Cardiomegaly: Secondary | ICD-10-CM | POA: Diagnosis not present

## 2022-09-03 DIAGNOSIS — I110 Hypertensive heart disease with heart failure: Secondary | ICD-10-CM | POA: Diagnosis not present

## 2022-09-03 DIAGNOSIS — R578 Other shock: Secondary | ICD-10-CM | POA: Diagnosis not present

## 2022-09-03 DIAGNOSIS — G629 Polyneuropathy, unspecified: Secondary | ICD-10-CM | POA: Diagnosis not present

## 2022-09-03 DIAGNOSIS — I253 Aneurysm of heart: Secondary | ICD-10-CM | POA: Diagnosis not present

## 2022-09-03 DIAGNOSIS — J9601 Acute respiratory failure with hypoxia: Secondary | ICD-10-CM | POA: Diagnosis not present

## 2022-09-03 DIAGNOSIS — I472 Ventricular tachycardia, unspecified: Secondary | ICD-10-CM | POA: Diagnosis not present

## 2022-09-03 DIAGNOSIS — I493 Ventricular premature depolarization: Secondary | ICD-10-CM | POA: Diagnosis not present

## 2022-09-03 DIAGNOSIS — J1282 Pneumonia due to coronavirus disease 2019: Secondary | ICD-10-CM | POA: Diagnosis not present

## 2022-09-03 DIAGNOSIS — I2510 Atherosclerotic heart disease of native coronary artery without angina pectoris: Secondary | ICD-10-CM | POA: Diagnosis not present

## 2022-09-03 DIAGNOSIS — I5043 Acute on chronic combined systolic (congestive) and diastolic (congestive) heart failure: Secondary | ICD-10-CM | POA: Diagnosis not present

## 2022-09-03 DIAGNOSIS — R5381 Other malaise: Secondary | ICD-10-CM | POA: Diagnosis not present

## 2022-09-03 DIAGNOSIS — N289 Disorder of kidney and ureter, unspecified: Secondary | ICD-10-CM | POA: Diagnosis not present

## 2022-09-03 DIAGNOSIS — E039 Hypothyroidism, unspecified: Secondary | ICD-10-CM | POA: Diagnosis not present

## 2022-09-03 DIAGNOSIS — I5189 Other ill-defined heart diseases: Secondary | ICD-10-CM | POA: Diagnosis not present

## 2022-09-03 DIAGNOSIS — R197 Diarrhea, unspecified: Secondary | ICD-10-CM | POA: Diagnosis not present

## 2022-09-03 DIAGNOSIS — I480 Paroxysmal atrial fibrillation: Secondary | ICD-10-CM | POA: Diagnosis not present

## 2022-09-03 DIAGNOSIS — I255 Ischemic cardiomyopathy: Secondary | ICD-10-CM | POA: Diagnosis not present

## 2022-09-03 DIAGNOSIS — R7989 Other specified abnormal findings of blood chemistry: Secondary | ICD-10-CM | POA: Diagnosis not present

## 2022-09-03 DIAGNOSIS — I509 Heart failure, unspecified: Secondary | ICD-10-CM | POA: Diagnosis not present

## 2022-09-03 DIAGNOSIS — I499 Cardiac arrhythmia, unspecified: Secondary | ICD-10-CM | POA: Diagnosis not present

## 2022-09-03 DIAGNOSIS — I4729 Other ventricular tachycardia: Secondary | ICD-10-CM | POA: Diagnosis not present

## 2022-09-04 DIAGNOSIS — R197 Diarrhea, unspecified: Secondary | ICD-10-CM | POA: Diagnosis not present

## 2022-09-04 DIAGNOSIS — I472 Ventricular tachycardia, unspecified: Secondary | ICD-10-CM | POA: Diagnosis not present

## 2022-09-04 DIAGNOSIS — Z95810 Presence of automatic (implantable) cardiac defibrillator: Secondary | ICD-10-CM | POA: Diagnosis not present

## 2022-09-04 DIAGNOSIS — N179 Acute kidney failure, unspecified: Secondary | ICD-10-CM | POA: Diagnosis not present

## 2022-09-04 DIAGNOSIS — U071 COVID-19: Secondary | ICD-10-CM | POA: Diagnosis not present

## 2022-09-04 DIAGNOSIS — R000 Tachycardia, unspecified: Secondary | ICD-10-CM | POA: Diagnosis not present

## 2022-09-05 ENCOUNTER — Telehealth: Payer: Self-pay

## 2022-09-05 DIAGNOSIS — I4729 Other ventricular tachycardia: Secondary | ICD-10-CM | POA: Diagnosis not present

## 2022-09-05 DIAGNOSIS — I428 Other cardiomyopathies: Secondary | ICD-10-CM | POA: Diagnosis not present

## 2022-09-05 DIAGNOSIS — I4892 Unspecified atrial flutter: Secondary | ICD-10-CM | POA: Diagnosis not present

## 2022-09-05 DIAGNOSIS — N179 Acute kidney failure, unspecified: Secondary | ICD-10-CM | POA: Diagnosis not present

## 2022-09-05 DIAGNOSIS — R000 Tachycardia, unspecified: Secondary | ICD-10-CM | POA: Diagnosis not present

## 2022-09-05 DIAGNOSIS — U071 COVID-19: Secondary | ICD-10-CM | POA: Diagnosis not present

## 2022-09-05 DIAGNOSIS — R197 Diarrhea, unspecified: Secondary | ICD-10-CM | POA: Diagnosis not present

## 2022-09-05 DIAGNOSIS — Z95810 Presence of automatic (implantable) cardiac defibrillator: Secondary | ICD-10-CM | POA: Diagnosis not present

## 2022-09-05 NOTE — Progress Notes (Signed)
Remote ICD transmission.   

## 2022-09-05 NOTE — Telephone Encounter (Signed)
Received the following high alert from [employer]:  MOD:  [place] alert for VT with unsuccessful therapy Events occurred 11/11 between 18:32 - 20:50.  EGM's show sustained VT, ATPx3 with slowing but continued VT, followed by ATPx2 again unsuccessful, 30J HV therapy unsuccessful ongoing VT.  Third event 19:24 ATPx1 and 20:50 ATP both with slowing but sustained VT Other events for AF, burden 7%, Eliquis Presenting rhythm regular AP/BiV pace Route to triage [place]  Patient currently admitted since 09/03/22 at [facility].   Sustained VT, Covid and Hypoxia.

## 2022-09-06 DIAGNOSIS — I428 Other cardiomyopathies: Secondary | ICD-10-CM | POA: Diagnosis not present

## 2022-09-06 DIAGNOSIS — R000 Tachycardia, unspecified: Secondary | ICD-10-CM | POA: Diagnosis not present

## 2022-09-06 DIAGNOSIS — R7989 Other specified abnormal findings of blood chemistry: Secondary | ICD-10-CM | POA: Diagnosis not present

## 2022-09-06 DIAGNOSIS — I4892 Unspecified atrial flutter: Secondary | ICD-10-CM | POA: Diagnosis not present

## 2022-09-06 DIAGNOSIS — Z95810 Presence of automatic (implantable) cardiac defibrillator: Secondary | ICD-10-CM | POA: Diagnosis not present

## 2022-09-06 DIAGNOSIS — I4729 Other ventricular tachycardia: Secondary | ICD-10-CM | POA: Diagnosis not present

## 2022-09-07 DIAGNOSIS — I428 Other cardiomyopathies: Secondary | ICD-10-CM | POA: Diagnosis not present

## 2022-09-07 DIAGNOSIS — I4892 Unspecified atrial flutter: Secondary | ICD-10-CM | POA: Diagnosis not present

## 2022-09-07 DIAGNOSIS — Z95810 Presence of automatic (implantable) cardiac defibrillator: Secondary | ICD-10-CM | POA: Diagnosis not present

## 2022-09-07 DIAGNOSIS — R000 Tachycardia, unspecified: Secondary | ICD-10-CM | POA: Diagnosis not present

## 2022-09-07 DIAGNOSIS — I4729 Other ventricular tachycardia: Secondary | ICD-10-CM | POA: Diagnosis not present

## 2022-09-08 DIAGNOSIS — I4729 Other ventricular tachycardia: Secondary | ICD-10-CM | POA: Diagnosis not present

## 2022-09-08 DIAGNOSIS — I428 Other cardiomyopathies: Secondary | ICD-10-CM | POA: Diagnosis not present

## 2022-09-08 DIAGNOSIS — Z95810 Presence of automatic (implantable) cardiac defibrillator: Secondary | ICD-10-CM | POA: Diagnosis not present

## 2022-09-08 DIAGNOSIS — I472 Ventricular tachycardia, unspecified: Secondary | ICD-10-CM | POA: Diagnosis not present

## 2022-09-08 DIAGNOSIS — R000 Tachycardia, unspecified: Secondary | ICD-10-CM | POA: Diagnosis not present

## 2022-09-08 DIAGNOSIS — I4892 Unspecified atrial flutter: Secondary | ICD-10-CM | POA: Diagnosis not present

## 2022-09-08 DIAGNOSIS — I480 Paroxysmal atrial fibrillation: Secondary | ICD-10-CM | POA: Diagnosis not present

## 2022-09-09 ENCOUNTER — Ambulatory Visit (INDEPENDENT_AMBULATORY_CARE_PROVIDER_SITE_OTHER): Payer: Medicare HMO

## 2022-09-09 DIAGNOSIS — I480 Paroxysmal atrial fibrillation: Secondary | ICD-10-CM | POA: Diagnosis not present

## 2022-09-09 DIAGNOSIS — I5022 Chronic systolic (congestive) heart failure: Secondary | ICD-10-CM

## 2022-09-09 DIAGNOSIS — I4729 Other ventricular tachycardia: Secondary | ICD-10-CM | POA: Diagnosis not present

## 2022-09-09 DIAGNOSIS — Z95810 Presence of automatic (implantable) cardiac defibrillator: Secondary | ICD-10-CM

## 2022-09-09 DIAGNOSIS — I428 Other cardiomyopathies: Secondary | ICD-10-CM | POA: Diagnosis not present

## 2022-09-09 DIAGNOSIS — R000 Tachycardia, unspecified: Secondary | ICD-10-CM | POA: Diagnosis not present

## 2022-09-09 DIAGNOSIS — I4892 Unspecified atrial flutter: Secondary | ICD-10-CM | POA: Diagnosis not present

## 2022-09-09 NOTE — Progress Notes (Signed)
EPIC Encounter for ICM Monitoring  Patient Name: [name] is a 81 y.o. male Date: 09/09/2022 Primary Care Physican: Orpah Melter, MD Primary Cardiologist: Johney Frame Electrophysiologist: Marisa Sprinkles Pacing: 84%            03/15/2022 Office Weight: 241 lbs   AT/AF Burden: 7.4% (taking Eliquis)          Transmission reviewed.  Pt admitted to [facility] 11/12 per 11/13 device clinic phone note.   Corvue thoracic impedance suggesting possible fluid accumulation starting 11/12.   Pt hospitalized during device shock and ATP.    Prescribed: Furosemide 20 mg Take 2 tablets (40 mg total) by mouth daily in the AM.  Take 1 tablet (20 mg) as needed in the PM for swelling.  Spironolactone 25 mg take 0.5 tablet once a day. Farxiga 10 mg take 1 tablet daily before breakfast   Labs: 11/23/2021 Creatinine 1.42, BUN 23, potassium 4.1, Sodium 140. GFR 50 A complete set of results can be found in Results Review.   Recommendations:  No changes    Follow-up plan: ICM clinic phone appointment on 09/19/2022 to recheck fluid levels.   91 day device clinic remote transmission 11/15/2022.     EP/Cardiology Office Visits:   11/07/2022 with Dr Johney Frame.  12/01/2021 with [name], PA.   Copy of ICM check sent to Dr. Quentin Ore. .   3 month ICM trend: 09/04/2022.    12-14 Month ICM trend:     [name], RN 09/09/2022 4:56 PM

## 2022-09-10 DIAGNOSIS — R000 Tachycardia, unspecified: Secondary | ICD-10-CM | POA: Diagnosis not present

## 2022-09-10 DIAGNOSIS — I472 Ventricular tachycardia, unspecified: Secondary | ICD-10-CM | POA: Diagnosis not present

## 2022-09-11 DIAGNOSIS — I472 Ventricular tachycardia, unspecified: Secondary | ICD-10-CM | POA: Diagnosis not present

## 2022-09-11 DIAGNOSIS — R000 Tachycardia, unspecified: Secondary | ICD-10-CM | POA: Diagnosis not present

## 2022-09-12 ENCOUNTER — Ambulatory Visit: Payer: Medicare HMO | Admitting: Cardiology

## 2022-09-12 DIAGNOSIS — I517 Cardiomegaly: Secondary | ICD-10-CM | POA: Diagnosis not present

## 2022-09-12 DIAGNOSIS — Z95810 Presence of automatic (implantable) cardiac defibrillator: Secondary | ICD-10-CM | POA: Diagnosis not present

## 2022-09-12 DIAGNOSIS — I5189 Other ill-defined heart diseases: Secondary | ICD-10-CM | POA: Diagnosis not present

## 2022-09-12 DIAGNOSIS — I4729 Other ventricular tachycardia: Secondary | ICD-10-CM | POA: Diagnosis not present

## 2022-09-12 DIAGNOSIS — I480 Paroxysmal atrial fibrillation: Secondary | ICD-10-CM | POA: Diagnosis not present

## 2022-09-12 DIAGNOSIS — I428 Other cardiomyopathies: Secondary | ICD-10-CM | POA: Diagnosis not present

## 2022-09-12 DIAGNOSIS — R000 Tachycardia, unspecified: Secondary | ICD-10-CM | POA: Diagnosis not present

## 2022-09-12 DIAGNOSIS — I4892 Unspecified atrial flutter: Secondary | ICD-10-CM | POA: Diagnosis not present

## 2022-09-13 DIAGNOSIS — Z95810 Presence of automatic (implantable) cardiac defibrillator: Secondary | ICD-10-CM | POA: Diagnosis not present

## 2022-09-13 DIAGNOSIS — I700 Atherosclerosis of aorta: Secondary | ICD-10-CM | POA: Diagnosis not present

## 2022-09-13 DIAGNOSIS — I2510 Atherosclerotic heart disease of native coronary artery without angina pectoris: Secondary | ICD-10-CM | POA: Diagnosis not present

## 2022-09-13 DIAGNOSIS — I4729 Other ventricular tachycardia: Secondary | ICD-10-CM | POA: Diagnosis not present

## 2022-09-13 DIAGNOSIS — I480 Paroxysmal atrial fibrillation: Secondary | ICD-10-CM | POA: Diagnosis not present

## 2022-09-13 DIAGNOSIS — R000 Tachycardia, unspecified: Secondary | ICD-10-CM | POA: Diagnosis not present

## 2022-09-13 DIAGNOSIS — E785 Hyperlipidemia, unspecified: Secondary | ICD-10-CM | POA: Diagnosis not present

## 2022-09-13 DIAGNOSIS — I253 Aneurysm of heart: Secondary | ICD-10-CM | POA: Diagnosis not present

## 2022-09-13 DIAGNOSIS — I4892 Unspecified atrial flutter: Secondary | ICD-10-CM | POA: Diagnosis not present

## 2022-09-13 DIAGNOSIS — I428 Other cardiomyopathies: Secondary | ICD-10-CM | POA: Diagnosis not present

## 2022-09-13 DIAGNOSIS — E039 Hypothyroidism, unspecified: Secondary | ICD-10-CM | POA: Diagnosis not present

## 2022-09-13 DIAGNOSIS — I080 Rheumatic disorders of both mitral and aortic valves: Secondary | ICD-10-CM | POA: Diagnosis not present

## 2022-09-13 DIAGNOSIS — E669 Obesity, unspecified: Secondary | ICD-10-CM | POA: Diagnosis not present

## 2022-09-13 DIAGNOSIS — N289 Disorder of kidney and ureter, unspecified: Secondary | ICD-10-CM | POA: Diagnosis not present

## 2022-09-13 DIAGNOSIS — I509 Heart failure, unspecified: Secondary | ICD-10-CM | POA: Diagnosis not present

## 2022-09-13 DIAGNOSIS — I110 Hypertensive heart disease with heart failure: Secondary | ICD-10-CM | POA: Diagnosis not present

## 2022-09-14 DIAGNOSIS — I4892 Unspecified atrial flutter: Secondary | ICD-10-CM | POA: Diagnosis not present

## 2022-09-14 DIAGNOSIS — Z95810 Presence of automatic (implantable) cardiac defibrillator: Secondary | ICD-10-CM | POA: Diagnosis not present

## 2022-09-14 DIAGNOSIS — I428 Other cardiomyopathies: Secondary | ICD-10-CM | POA: Diagnosis not present

## 2022-09-14 DIAGNOSIS — I4729 Other ventricular tachycardia: Secondary | ICD-10-CM | POA: Diagnosis not present

## 2022-09-14 DIAGNOSIS — I480 Paroxysmal atrial fibrillation: Secondary | ICD-10-CM | POA: Diagnosis not present

## 2022-09-14 DIAGNOSIS — R000 Tachycardia, unspecified: Secondary | ICD-10-CM | POA: Diagnosis not present

## 2022-09-15 DIAGNOSIS — I4892 Unspecified atrial flutter: Secondary | ICD-10-CM | POA: Diagnosis not present

## 2022-09-15 DIAGNOSIS — I480 Paroxysmal atrial fibrillation: Secondary | ICD-10-CM | POA: Diagnosis not present

## 2022-09-15 DIAGNOSIS — R000 Tachycardia, unspecified: Secondary | ICD-10-CM | POA: Diagnosis not present

## 2022-09-16 DIAGNOSIS — R000 Tachycardia, unspecified: Secondary | ICD-10-CM | POA: Diagnosis not present

## 2022-09-16 DIAGNOSIS — I4892 Unspecified atrial flutter: Secondary | ICD-10-CM | POA: Diagnosis not present

## 2022-09-16 DIAGNOSIS — I480 Paroxysmal atrial fibrillation: Secondary | ICD-10-CM | POA: Diagnosis not present

## 2022-09-16 DIAGNOSIS — I472 Ventricular tachycardia, unspecified: Secondary | ICD-10-CM | POA: Diagnosis not present

## 2022-09-17 DIAGNOSIS — I4892 Unspecified atrial flutter: Secondary | ICD-10-CM | POA: Diagnosis not present

## 2022-09-17 DIAGNOSIS — I255 Ischemic cardiomyopathy: Secondary | ICD-10-CM | POA: Diagnosis not present

## 2022-09-17 DIAGNOSIS — R000 Tachycardia, unspecified: Secondary | ICD-10-CM | POA: Diagnosis not present

## 2022-09-17 DIAGNOSIS — N179 Acute kidney failure, unspecified: Secondary | ICD-10-CM | POA: Diagnosis not present

## 2022-09-17 DIAGNOSIS — I480 Paroxysmal atrial fibrillation: Secondary | ICD-10-CM | POA: Diagnosis not present

## 2022-09-17 DIAGNOSIS — I4729 Other ventricular tachycardia: Secondary | ICD-10-CM | POA: Diagnosis not present

## 2022-09-17 DIAGNOSIS — I5020 Unspecified systolic (congestive) heart failure: Secondary | ICD-10-CM | POA: Diagnosis not present

## 2022-09-17 DIAGNOSIS — I420 Dilated cardiomyopathy: Secondary | ICD-10-CM | POA: Diagnosis not present

## 2022-09-17 DIAGNOSIS — I472 Ventricular tachycardia, unspecified: Secondary | ICD-10-CM | POA: Diagnosis not present

## 2022-09-18 DIAGNOSIS — I4892 Unspecified atrial flutter: Secondary | ICD-10-CM | POA: Diagnosis not present

## 2022-09-18 DIAGNOSIS — R000 Tachycardia, unspecified: Secondary | ICD-10-CM | POA: Diagnosis not present

## 2022-09-18 DIAGNOSIS — I480 Paroxysmal atrial fibrillation: Secondary | ICD-10-CM | POA: Diagnosis not present

## 2022-09-19 DIAGNOSIS — I472 Ventricular tachycardia, unspecified: Secondary | ICD-10-CM | POA: Diagnosis not present

## 2022-09-19 DIAGNOSIS — I480 Paroxysmal atrial fibrillation: Secondary | ICD-10-CM | POA: Diagnosis not present

## 2022-09-19 DIAGNOSIS — I4892 Unspecified atrial flutter: Secondary | ICD-10-CM | POA: Diagnosis not present

## 2022-09-19 DIAGNOSIS — R000 Tachycardia, unspecified: Secondary | ICD-10-CM | POA: Diagnosis not present

## 2022-09-19 DIAGNOSIS — I081 Rheumatic disorders of both mitral and tricuspid valves: Secondary | ICD-10-CM | POA: Diagnosis not present

## 2022-09-20 DIAGNOSIS — I480 Paroxysmal atrial fibrillation: Secondary | ICD-10-CM | POA: Diagnosis not present

## 2022-09-20 DIAGNOSIS — J9811 Atelectasis: Secondary | ICD-10-CM | POA: Diagnosis not present

## 2022-09-20 DIAGNOSIS — I4729 Other ventricular tachycardia: Secondary | ICD-10-CM | POA: Diagnosis not present

## 2022-09-20 DIAGNOSIS — I255 Ischemic cardiomyopathy: Secondary | ICD-10-CM | POA: Diagnosis not present

## 2022-09-20 DIAGNOSIS — I4892 Unspecified atrial flutter: Secondary | ICD-10-CM | POA: Diagnosis not present

## 2022-09-20 DIAGNOSIS — R000 Tachycardia, unspecified: Secondary | ICD-10-CM | POA: Diagnosis not present

## 2022-09-21 DIAGNOSIS — I4729 Other ventricular tachycardia: Secondary | ICD-10-CM | POA: Diagnosis not present

## 2022-09-21 DIAGNOSIS — I255 Ischemic cardiomyopathy: Secondary | ICD-10-CM | POA: Diagnosis not present

## 2022-09-21 DIAGNOSIS — I480 Paroxysmal atrial fibrillation: Secondary | ICD-10-CM | POA: Diagnosis not present

## 2022-09-21 DIAGNOSIS — R000 Tachycardia, unspecified: Secondary | ICD-10-CM | POA: Diagnosis not present

## 2022-09-21 DIAGNOSIS — I4892 Unspecified atrial flutter: Secondary | ICD-10-CM | POA: Diagnosis not present

## 2022-09-22 DIAGNOSIS — R000 Tachycardia, unspecified: Secondary | ICD-10-CM | POA: Diagnosis not present

## 2022-09-23 DIAGNOSIS — I480 Paroxysmal atrial fibrillation: Secondary | ICD-10-CM | POA: Diagnosis not present

## 2022-09-23 DIAGNOSIS — N179 Acute kidney failure, unspecified: Secondary | ICD-10-CM | POA: Diagnosis not present

## 2022-09-23 DIAGNOSIS — I517 Cardiomegaly: Secondary | ICD-10-CM | POA: Diagnosis not present

## 2022-09-23 DIAGNOSIS — I5189 Other ill-defined heart diseases: Secondary | ICD-10-CM | POA: Diagnosis not present

## 2022-09-23 DIAGNOSIS — E669 Obesity, unspecified: Secondary | ICD-10-CM | POA: Diagnosis not present

## 2022-09-23 DIAGNOSIS — I4729 Other ventricular tachycardia: Secondary | ICD-10-CM | POA: Diagnosis not present

## 2022-09-23 DIAGNOSIS — I472 Ventricular tachycardia, unspecified: Secondary | ICD-10-CM | POA: Diagnosis not present

## 2022-09-23 DIAGNOSIS — I447 Left bundle-branch block, unspecified: Secondary | ICD-10-CM | POA: Diagnosis not present

## 2022-09-23 DIAGNOSIS — I110 Hypertensive heart disease with heart failure: Secondary | ICD-10-CM | POA: Diagnosis not present

## 2022-09-23 DIAGNOSIS — I2510 Atherosclerotic heart disease of native coronary artery without angina pectoris: Secondary | ICD-10-CM | POA: Diagnosis not present

## 2022-09-23 DIAGNOSIS — I509 Heart failure, unspecified: Secondary | ICD-10-CM | POA: Diagnosis not present

## 2022-09-23 DIAGNOSIS — G4730 Sleep apnea, unspecified: Secondary | ICD-10-CM | POA: Diagnosis not present

## 2022-09-24 DIAGNOSIS — Z4502 Encounter for adjustment and management of automatic implantable cardiac defibrillator: Secondary | ICD-10-CM | POA: Diagnosis not present

## 2022-09-24 DIAGNOSIS — Z95810 Presence of automatic (implantable) cardiac defibrillator: Secondary | ICD-10-CM | POA: Diagnosis not present

## 2022-09-24 DIAGNOSIS — I472 Ventricular tachycardia, unspecified: Secondary | ICD-10-CM | POA: Diagnosis not present

## 2022-09-24 DIAGNOSIS — I2510 Atherosclerotic heart disease of native coronary artery without angina pectoris: Secondary | ICD-10-CM | POA: Diagnosis not present

## 2022-09-24 DIAGNOSIS — I480 Paroxysmal atrial fibrillation: Secondary | ICD-10-CM | POA: Diagnosis not present

## 2022-09-25 DIAGNOSIS — I2510 Atherosclerotic heart disease of native coronary artery without angina pectoris: Secondary | ICD-10-CM | POA: Diagnosis not present

## 2022-09-25 DIAGNOSIS — Z95810 Presence of automatic (implantable) cardiac defibrillator: Secondary | ICD-10-CM | POA: Diagnosis not present

## 2022-09-25 DIAGNOSIS — Z4502 Encounter for adjustment and management of automatic implantable cardiac defibrillator: Secondary | ICD-10-CM | POA: Diagnosis not present

## 2022-09-25 DIAGNOSIS — I472 Ventricular tachycardia, unspecified: Secondary | ICD-10-CM | POA: Diagnosis not present

## 2022-09-25 DIAGNOSIS — I480 Paroxysmal atrial fibrillation: Secondary | ICD-10-CM | POA: Diagnosis not present

## 2022-09-26 DIAGNOSIS — I255 Ischemic cardiomyopathy: Secondary | ICD-10-CM | POA: Diagnosis not present

## 2022-09-26 DIAGNOSIS — R000 Tachycardia, unspecified: Secondary | ICD-10-CM | POA: Diagnosis not present

## 2022-09-26 DIAGNOSIS — I5022 Chronic systolic (congestive) heart failure: Secondary | ICD-10-CM | POA: Diagnosis not present

## 2022-09-26 DIAGNOSIS — I4729 Other ventricular tachycardia: Secondary | ICD-10-CM | POA: Diagnosis not present

## 2022-09-27 DIAGNOSIS — I4729 Other ventricular tachycardia: Secondary | ICD-10-CM | POA: Diagnosis not present

## 2022-09-27 DIAGNOSIS — R000 Tachycardia, unspecified: Secondary | ICD-10-CM | POA: Diagnosis not present

## 2022-09-27 DIAGNOSIS — I255 Ischemic cardiomyopathy: Secondary | ICD-10-CM | POA: Diagnosis not present

## 2022-09-27 DIAGNOSIS — I5022 Chronic systolic (congestive) heart failure: Secondary | ICD-10-CM | POA: Diagnosis not present

## 2022-09-28 ENCOUNTER — Other Ambulatory Visit: Payer: Medicare HMO

## 2022-09-28 DIAGNOSIS — R000 Tachycardia, unspecified: Secondary | ICD-10-CM | POA: Diagnosis not present

## 2022-09-28 DIAGNOSIS — I255 Ischemic cardiomyopathy: Secondary | ICD-10-CM | POA: Diagnosis not present

## 2022-09-28 DIAGNOSIS — I5022 Chronic systolic (congestive) heart failure: Secondary | ICD-10-CM | POA: Diagnosis not present

## 2022-09-28 DIAGNOSIS — I4729 Other ventricular tachycardia: Secondary | ICD-10-CM | POA: Diagnosis not present

## 2022-09-29 DIAGNOSIS — I255 Ischemic cardiomyopathy: Secondary | ICD-10-CM | POA: Diagnosis not present

## 2022-09-29 DIAGNOSIS — I4729 Other ventricular tachycardia: Secondary | ICD-10-CM | POA: Diagnosis not present

## 2022-09-29 DIAGNOSIS — I5022 Chronic systolic (congestive) heart failure: Secondary | ICD-10-CM | POA: Diagnosis not present

## 2022-09-30 DIAGNOSIS — Z9181 History of falling: Secondary | ICD-10-CM | POA: Diagnosis not present

## 2022-09-30 DIAGNOSIS — I4819 Other persistent atrial fibrillation: Secondary | ICD-10-CM | POA: Diagnosis not present

## 2022-09-30 DIAGNOSIS — Z85821 Personal history of Merkel cell carcinoma: Secondary | ICD-10-CM | POA: Diagnosis not present

## 2022-09-30 DIAGNOSIS — Z95810 Presence of automatic (implantable) cardiac defibrillator: Secondary | ICD-10-CM | POA: Diagnosis not present

## 2022-09-30 DIAGNOSIS — Z8616 Personal history of COVID-19: Secondary | ICD-10-CM | POA: Diagnosis not present

## 2022-09-30 DIAGNOSIS — I482 Chronic atrial fibrillation, unspecified: Secondary | ICD-10-CM | POA: Diagnosis not present

## 2022-09-30 DIAGNOSIS — N189 Chronic kidney disease, unspecified: Secondary | ICD-10-CM | POA: Diagnosis not present

## 2022-09-30 DIAGNOSIS — I255 Ischemic cardiomyopathy: Secondary | ICD-10-CM | POA: Diagnosis not present

## 2022-09-30 DIAGNOSIS — I447 Left bundle-branch block, unspecified: Secondary | ICD-10-CM | POA: Diagnosis not present

## 2022-09-30 DIAGNOSIS — Z7901 Long term (current) use of anticoagulants: Secondary | ICD-10-CM | POA: Diagnosis not present

## 2022-09-30 DIAGNOSIS — E785 Hyperlipidemia, unspecified: Secondary | ICD-10-CM | POA: Diagnosis not present

## 2022-09-30 DIAGNOSIS — I130 Hypertensive heart and chronic kidney disease with heart failure and stage 1 through stage 4 chronic kidney disease, or unspecified chronic kidney disease: Secondary | ICD-10-CM | POA: Diagnosis not present

## 2022-09-30 DIAGNOSIS — I2510 Atherosclerotic heart disease of native coronary artery without angina pectoris: Secondary | ICD-10-CM | POA: Diagnosis not present

## 2022-09-30 DIAGNOSIS — I5023 Acute on chronic systolic (congestive) heart failure: Secondary | ICD-10-CM | POA: Diagnosis not present

## 2022-09-30 DIAGNOSIS — I890 Lymphedema, not elsewhere classified: Secondary | ICD-10-CM | POA: Diagnosis not present

## 2022-09-30 DIAGNOSIS — I4892 Unspecified atrial flutter: Secondary | ICD-10-CM | POA: Diagnosis not present

## 2022-09-30 DIAGNOSIS — I5032 Chronic diastolic (congestive) heart failure: Secondary | ICD-10-CM | POA: Diagnosis not present

## 2022-09-30 DIAGNOSIS — Z955 Presence of coronary angioplasty implant and graft: Secondary | ICD-10-CM | POA: Diagnosis not present

## 2022-10-04 ENCOUNTER — Telehealth: Payer: Self-pay

## 2022-10-04 DIAGNOSIS — Z596 Low income: Secondary | ICD-10-CM

## 2022-10-04 NOTE — Progress Notes (Signed)
[facility] [facility])                                            [place] Team    10/04/2022  [name] Nov 04, 1940 [id number]                                [place] [facility])                                            [place] Team   Received provider and patien portion(s) of patient assistance application(s) for [name]. Faxed completed application and required documents into [place] and [employer].  Lelon Huh, [place] [facility] [phone]

## 2022-10-06 DIAGNOSIS — I5020 Unspecified systolic (congestive) heart failure: Secondary | ICD-10-CM | POA: Diagnosis not present

## 2022-10-07 ENCOUNTER — Telehealth: Payer: Self-pay

## 2022-10-07 DIAGNOSIS — I130 Hypertensive heart and chronic kidney disease with heart failure and stage 1 through stage 4 chronic kidney disease, or unspecified chronic kidney disease: Secondary | ICD-10-CM | POA: Diagnosis not present

## 2022-10-07 DIAGNOSIS — I5023 Acute on chronic systolic (congestive) heart failure: Secondary | ICD-10-CM | POA: Diagnosis not present

## 2022-10-07 DIAGNOSIS — I482 Chronic atrial fibrillation, unspecified: Secondary | ICD-10-CM | POA: Diagnosis not present

## 2022-10-07 DIAGNOSIS — Z9181 History of falling: Secondary | ICD-10-CM | POA: Diagnosis not present

## 2022-10-07 NOTE — Telephone Encounter (Signed)
PATIENT WAS APPROVED BY [place] FROM 10/24/22-10/24/23 ID#[id number]-[id number]

## 2022-10-10 DIAGNOSIS — I517 Cardiomegaly: Secondary | ICD-10-CM | POA: Diagnosis not present

## 2022-10-10 DIAGNOSIS — Z7189 Other specified counseling: Secondary | ICD-10-CM | POA: Diagnosis not present

## 2022-10-10 DIAGNOSIS — I959 Hypotension, unspecified: Secondary | ICD-10-CM | POA: Diagnosis not present

## 2022-10-10 DIAGNOSIS — I252 Old myocardial infarction: Secondary | ICD-10-CM | POA: Diagnosis not present

## 2022-10-10 DIAGNOSIS — Z515 Encounter for palliative care: Secondary | ICD-10-CM | POA: Diagnosis not present

## 2022-10-10 DIAGNOSIS — I4719 Other supraventricular tachycardia: Secondary | ICD-10-CM | POA: Diagnosis not present

## 2022-10-10 DIAGNOSIS — Z20822 Contact with and (suspected) exposure to covid-19: Secondary | ICD-10-CM | POA: Diagnosis not present

## 2022-10-10 DIAGNOSIS — Z8616 Personal history of COVID-19: Secondary | ICD-10-CM | POA: Diagnosis not present

## 2022-10-10 DIAGNOSIS — R0602 Shortness of breath: Secondary | ICD-10-CM | POA: Diagnosis not present

## 2022-10-10 DIAGNOSIS — N179 Acute kidney failure, unspecified: Secondary | ICD-10-CM | POA: Diagnosis not present

## 2022-10-10 DIAGNOSIS — I480 Paroxysmal atrial fibrillation: Secondary | ICD-10-CM | POA: Diagnosis not present

## 2022-10-10 DIAGNOSIS — D696 Thrombocytopenia, unspecified: Secondary | ICD-10-CM | POA: Diagnosis not present

## 2022-10-10 DIAGNOSIS — K7200 Acute and subacute hepatic failure without coma: Secondary | ICD-10-CM | POA: Diagnosis not present

## 2022-10-10 DIAGNOSIS — R918 Other nonspecific abnormal finding of lung field: Secondary | ICD-10-CM | POA: Diagnosis not present

## 2022-10-10 DIAGNOSIS — I4891 Unspecified atrial fibrillation: Secondary | ICD-10-CM | POA: Diagnosis not present

## 2022-10-10 DIAGNOSIS — Z66 Do not resuscitate: Secondary | ICD-10-CM | POA: Diagnosis not present

## 2022-10-10 DIAGNOSIS — R590 Localized enlarged lymph nodes: Secondary | ICD-10-CM | POA: Diagnosis not present

## 2022-10-10 DIAGNOSIS — Z95810 Presence of automatic (implantable) cardiac defibrillator: Secondary | ICD-10-CM | POA: Diagnosis not present

## 2022-10-10 DIAGNOSIS — I4892 Unspecified atrial flutter: Secondary | ICD-10-CM | POA: Diagnosis not present

## 2022-10-10 DIAGNOSIS — I255 Ischemic cardiomyopathy: Secondary | ICD-10-CM | POA: Diagnosis not present

## 2022-10-10 DIAGNOSIS — I5023 Acute on chronic systolic (congestive) heart failure: Secondary | ICD-10-CM | POA: Diagnosis not present

## 2022-10-10 DIAGNOSIS — I447 Left bundle-branch block, unspecified: Secondary | ICD-10-CM | POA: Diagnosis not present

## 2022-10-10 DIAGNOSIS — R197 Diarrhea, unspecified: Secondary | ICD-10-CM | POA: Diagnosis not present

## 2022-10-10 DIAGNOSIS — I130 Hypertensive heart and chronic kidney disease with heart failure and stage 1 through stage 4 chronic kidney disease, or unspecified chronic kidney disease: Secondary | ICD-10-CM | POA: Diagnosis not present

## 2022-10-10 DIAGNOSIS — I472 Ventricular tachycardia, unspecified: Secondary | ICD-10-CM | POA: Diagnosis not present

## 2022-10-10 DIAGNOSIS — Z743 Need for continuous supervision: Secondary | ICD-10-CM | POA: Diagnosis not present

## 2022-10-10 DIAGNOSIS — I5020 Unspecified systolic (congestive) heart failure: Secondary | ICD-10-CM | POA: Diagnosis not present

## 2022-10-10 DIAGNOSIS — D631 Anemia in chronic kidney disease: Secondary | ICD-10-CM | POA: Diagnosis not present

## 2022-10-10 DIAGNOSIS — N189 Chronic kidney disease, unspecified: Secondary | ICD-10-CM | POA: Diagnosis not present

## 2022-10-10 DIAGNOSIS — Z452 Encounter for adjustment and management of vascular access device: Secondary | ICD-10-CM | POA: Diagnosis not present

## 2022-10-10 DIAGNOSIS — M7989 Other specified soft tissue disorders: Secondary | ICD-10-CM | POA: Diagnosis not present

## 2022-10-10 DIAGNOSIS — R5381 Other malaise: Secondary | ICD-10-CM | POA: Diagnosis not present

## 2022-10-10 DIAGNOSIS — E785 Hyperlipidemia, unspecified: Secondary | ICD-10-CM | POA: Diagnosis not present

## 2022-10-10 DIAGNOSIS — I2510 Atherosclerotic heart disease of native coronary artery without angina pectoris: Secondary | ICD-10-CM | POA: Diagnosis not present

## 2022-10-10 DIAGNOSIS — R5383 Other fatigue: Secondary | ICD-10-CM | POA: Diagnosis not present

## 2022-10-10 DIAGNOSIS — I4729 Other ventricular tachycardia: Secondary | ICD-10-CM | POA: Diagnosis not present

## 2022-10-10 DIAGNOSIS — R531 Weakness: Secondary | ICD-10-CM | POA: Diagnosis not present

## 2022-10-15 DIAGNOSIS — I472 Ventricular tachycardia, unspecified: Secondary | ICD-10-CM | POA: Diagnosis not present

## 2022-10-15 DIAGNOSIS — R531 Weakness: Secondary | ICD-10-CM | POA: Diagnosis not present

## 2022-10-15 DIAGNOSIS — Z743 Need for continuous supervision: Secondary | ICD-10-CM | POA: Diagnosis not present

## 2022-10-17 DIAGNOSIS — I472 Ventricular tachycardia, unspecified: Secondary | ICD-10-CM | POA: Diagnosis not present

## 2022-10-19 NOTE — Telephone Encounter (Signed)
**Note De-Identified [name] Obfuscation** Letter received [name] fax from [employer] stating that they have approved the pt for Entresto assistance until 10/24/2023. Pt ID: [id number]  The letter states that the have notified the pt of this approval as well.

## 2022-11-04 ENCOUNTER — Ambulatory Visit: Payer: Medicare HMO | Admitting: Cardiology

## 2022-11-07 ENCOUNTER — Ambulatory Visit: Payer: Medicare HMO | Admitting: Cardiology

## 2022-12-01 ENCOUNTER — Encounter: Payer: Medicare HMO | Admitting: Student

## 2022-12-23 DEATH — deceased

## 2023-05-08 ENCOUNTER — Other Ambulatory Visit: Payer: Self-pay

## 2023-05-08 DIAGNOSIS — Z79899 Other long term (current) drug therapy: Secondary | ICD-10-CM

## 2023-05-08 DIAGNOSIS — I5042 Chronic combined systolic (congestive) and diastolic (congestive) heart failure: Secondary | ICD-10-CM

## 2023-05-08 DIAGNOSIS — E785 Hyperlipidemia, unspecified: Secondary | ICD-10-CM

## 2023-05-08 DIAGNOSIS — Z95810 Presence of automatic (implantable) cardiac defibrillator: Secondary | ICD-10-CM

## 2023-05-08 DIAGNOSIS — I2510 Atherosclerotic heart disease of native coronary artery without angina pectoris: Secondary | ICD-10-CM

## 2023-05-08 DIAGNOSIS — I5022 Chronic systolic (congestive) heart failure: Secondary | ICD-10-CM

## 2023-05-08 DIAGNOSIS — E7849 Other hyperlipidemia: Secondary | ICD-10-CM

## 2023-05-08 MED ORDER — DAPAGLIFLOZIN PROPANEDIOL 10 MG PO TABS: 10.0000 mg | ORAL_TABLET | Freq: Every day | ORAL | 3 refills | Status: AC
